# Patient Record
Sex: Female | Born: 1955
Health system: Southern US, Community
[De-identification: ages and names within clinical notes are randomized; demographics above are authoritative.]

## PROBLEM LIST (undated history)

## (undated) DIAGNOSIS — R1310 Dysphagia, unspecified: Secondary | ICD-10-CM

## (undated) DIAGNOSIS — K648 Other hemorrhoids: Secondary | ICD-10-CM

## (undated) DIAGNOSIS — M797 Fibromyalgia: Secondary | ICD-10-CM

## (undated) DIAGNOSIS — E669 Obesity, unspecified: Secondary | ICD-10-CM

## (undated) DIAGNOSIS — E785 Hyperlipidemia, unspecified: Secondary | ICD-10-CM

## (undated) DIAGNOSIS — F419 Anxiety disorder, unspecified: Secondary | ICD-10-CM

## (undated) DIAGNOSIS — Z8489 Family history of other specified conditions: Secondary | ICD-10-CM

## (undated) DIAGNOSIS — M7989 Other specified soft tissue disorders: Secondary | ICD-10-CM

## (undated) DIAGNOSIS — Z464 Encounter for fitting and adjustment of orthodontic device: Secondary | ICD-10-CM

## (undated) DIAGNOSIS — R55 Syncope and collapse: Secondary | ICD-10-CM

## (undated) DIAGNOSIS — K5900 Constipation, unspecified: Secondary | ICD-10-CM

## (undated) DIAGNOSIS — M1990 Unspecified osteoarthritis, unspecified site: Secondary | ICD-10-CM

## (undated) DIAGNOSIS — IMO0001 Reserved for inherently not codable concepts without codable children: Secondary | ICD-10-CM

## (undated) DIAGNOSIS — G4730 Sleep apnea, unspecified: Secondary | ICD-10-CM

## (undated) DIAGNOSIS — G43909 Migraine, unspecified, not intractable, without status migrainosus: Secondary | ICD-10-CM

## (undated) DIAGNOSIS — E538 Deficiency of other specified B group vitamins: Secondary | ICD-10-CM

## (undated) DIAGNOSIS — K219 Gastro-esophageal reflux disease without esophagitis: Secondary | ICD-10-CM

## (undated) DIAGNOSIS — M545 Low back pain, unspecified: Secondary | ICD-10-CM

## (undated) DIAGNOSIS — T7840XA Allergy, unspecified, initial encounter: Secondary | ICD-10-CM

## (undated) DIAGNOSIS — IMO0002 Reserved for concepts with insufficient information to code with codable children: Secondary | ICD-10-CM

## (undated) DIAGNOSIS — D509 Iron deficiency anemia, unspecified: Secondary | ICD-10-CM

## (undated) DIAGNOSIS — R12 Heartburn: Secondary | ICD-10-CM

## (undated) DIAGNOSIS — B49 Unspecified mycosis: Secondary | ICD-10-CM

## (undated) HISTORY — DX: Constipation, unspecified: K59.00

## (undated) HISTORY — DX: Other specified soft tissue disorders: M79.89

## (undated) HISTORY — DX: Dysphagia, unspecified: R13.10

## (undated) HISTORY — DX: Fibromyalgia: M79.7

## (undated) HISTORY — DX: Low back pain: M54.5

## (undated) HISTORY — DX: Low back pain, unspecified: M54.50

## (undated) HISTORY — DX: Iron deficiency anemia, unspecified: D50.9

## (undated) HISTORY — PX: NASAL SINUS SURGERY: SHX719

## (undated) HISTORY — DX: Unspecified osteoarthritis, unspecified site: M19.90

## (undated) HISTORY — DX: Sleep apnea, unspecified: G47.30

## (undated) HISTORY — DX: Unspecified mycosis: B49

## (undated) HISTORY — DX: Hyperlipidemia, unspecified: E78.5

## (undated) HISTORY — DX: Gastro-esophageal reflux disease without esophagitis: K21.9

## (undated) HISTORY — PX: COLONOSCOPY: SHX174

## (undated) HISTORY — DX: Obesity, unspecified: E66.9

## (undated) HISTORY — PX: TONSILLECTOMY AND ADENOIDECTOMY: SUR1326

## (undated) HISTORY — DX: Anxiety disorder, unspecified: F41.9

## (undated) HISTORY — PX: TUBAL LIGATION: SHX77

## (undated) HISTORY — DX: Heartburn: R12

## (undated) HISTORY — DX: Deficiency of other specified B group vitamins: E53.8

## (undated) HISTORY — DX: Other hemorrhoids: K64.8

## (undated) HISTORY — DX: Reserved for concepts with insufficient information to code with codable children: IMO0002

## (undated) HISTORY — DX: Allergy, unspecified, initial encounter: T78.40XA

## (undated) HISTORY — PX: KNEE SURGERY: SHX244

---

## 1998-12-09 HISTORY — PX: HAND SURGERY: SHX662

## 2003-09-22 ENCOUNTER — Other Ambulatory Visit: Admission: RE | Admit: 2003-09-22 | Discharge: 2003-09-22 | Payer: Self-pay | Admitting: *Deleted

## 2003-12-10 DIAGNOSIS — B49 Unspecified mycosis: Secondary | ICD-10-CM

## 2003-12-10 HISTORY — PX: DILATION AND CURETTAGE OF UTERUS: SHX78

## 2003-12-10 HISTORY — DX: Unspecified mycosis: B49

## 2004-01-23 ENCOUNTER — Ambulatory Visit (HOSPITAL_BASED_OUTPATIENT_CLINIC_OR_DEPARTMENT_OTHER): Admission: RE | Admit: 2004-01-23 | Discharge: 2004-01-23 | Payer: Self-pay | Admitting: *Deleted

## 2004-01-23 ENCOUNTER — Ambulatory Visit (HOSPITAL_COMMUNITY): Admission: RE | Admit: 2004-01-23 | Discharge: 2004-01-23 | Payer: Self-pay | Admitting: *Deleted

## 2004-01-23 ENCOUNTER — Encounter (INDEPENDENT_AMBULATORY_CARE_PROVIDER_SITE_OTHER): Payer: Self-pay | Admitting: Specialist

## 2004-10-11 ENCOUNTER — Ambulatory Visit: Payer: Self-pay | Admitting: Gastroenterology

## 2004-10-19 ENCOUNTER — Ambulatory Visit: Payer: Self-pay | Admitting: Gastroenterology

## 2004-10-30 ENCOUNTER — Ambulatory Visit: Payer: Self-pay | Admitting: Family Medicine

## 2004-11-05 ENCOUNTER — Ambulatory Visit: Payer: Self-pay | Admitting: Family Medicine

## 2004-12-11 ENCOUNTER — Ambulatory Visit: Payer: Self-pay | Admitting: Gastroenterology

## 2005-01-08 ENCOUNTER — Ambulatory Visit (HOSPITAL_COMMUNITY): Admission: RE | Admit: 2005-01-08 | Discharge: 2005-01-08 | Payer: Self-pay | Admitting: Gastroenterology

## 2005-01-14 ENCOUNTER — Ambulatory Visit: Payer: Self-pay | Admitting: Gastroenterology

## 2005-01-14 ENCOUNTER — Ambulatory Visit (HOSPITAL_COMMUNITY): Admission: RE | Admit: 2005-01-14 | Discharge: 2005-01-14 | Payer: Self-pay | Admitting: Gastroenterology

## 2005-01-22 ENCOUNTER — Ambulatory Visit: Payer: Self-pay | Admitting: Gastroenterology

## 2005-01-25 ENCOUNTER — Ambulatory Visit (HOSPITAL_COMMUNITY): Admission: RE | Admit: 2005-01-25 | Discharge: 2005-01-25 | Payer: Self-pay | Admitting: Gastroenterology

## 2005-02-05 ENCOUNTER — Ambulatory Visit: Payer: Self-pay | Admitting: Gastroenterology

## 2005-02-12 ENCOUNTER — Ambulatory Visit: Payer: Self-pay | Admitting: Family Medicine

## 2005-03-08 ENCOUNTER — Ambulatory Visit: Payer: Self-pay | Admitting: Family Medicine

## 2005-04-03 ENCOUNTER — Ambulatory Visit: Payer: Self-pay | Admitting: Family Medicine

## 2005-06-07 ENCOUNTER — Ambulatory Visit: Payer: Self-pay | Admitting: Family Medicine

## 2005-06-28 ENCOUNTER — Ambulatory Visit: Payer: Self-pay | Admitting: Family Medicine

## 2005-07-30 ENCOUNTER — Ambulatory Visit: Payer: Self-pay | Admitting: Family Medicine

## 2005-11-26 ENCOUNTER — Ambulatory Visit: Payer: Self-pay | Admitting: Family Medicine

## 2005-12-03 ENCOUNTER — Ambulatory Visit: Payer: Self-pay | Admitting: Family Medicine

## 2006-01-08 ENCOUNTER — Ambulatory Visit: Payer: Self-pay | Admitting: Family Medicine

## 2006-01-09 ENCOUNTER — Encounter: Payer: Self-pay | Admitting: Family Medicine

## 2006-01-14 ENCOUNTER — Ambulatory Visit: Payer: Self-pay | Admitting: Family Medicine

## 2006-02-04 ENCOUNTER — Ambulatory Visit: Payer: Self-pay | Admitting: Gastroenterology

## 2006-03-07 ENCOUNTER — Ambulatory Visit: Payer: Self-pay | Admitting: Family Medicine

## 2006-05-13 ENCOUNTER — Other Ambulatory Visit: Payer: Self-pay

## 2006-05-13 ENCOUNTER — Emergency Department: Payer: Self-pay | Admitting: Emergency Medicine

## 2006-05-15 ENCOUNTER — Ambulatory Visit: Payer: Self-pay | Admitting: Family Medicine

## 2006-05-20 ENCOUNTER — Ambulatory Visit: Payer: Self-pay | Admitting: Family Medicine

## 2006-08-05 ENCOUNTER — Ambulatory Visit: Payer: Self-pay | Admitting: Family Medicine

## 2006-08-19 ENCOUNTER — Ambulatory Visit: Payer: Self-pay | Admitting: Family Medicine

## 2006-09-09 ENCOUNTER — Ambulatory Visit: Payer: Self-pay | Admitting: Internal Medicine

## 2006-12-05 ENCOUNTER — Ambulatory Visit: Payer: Self-pay | Admitting: Family Medicine

## 2006-12-26 ENCOUNTER — Ambulatory Visit: Payer: Self-pay | Admitting: Family Medicine

## 2007-01-14 ENCOUNTER — Ambulatory Visit: Payer: Self-pay | Admitting: Family Medicine

## 2007-02-12 ENCOUNTER — Ambulatory Visit: Payer: Self-pay | Admitting: Obstetrics & Gynecology

## 2007-02-12 ENCOUNTER — Encounter: Payer: Self-pay | Admitting: Obstetrics & Gynecology

## 2007-03-05 ENCOUNTER — Ambulatory Visit: Payer: Self-pay | Admitting: Family Medicine

## 2007-03-06 ENCOUNTER — Encounter: Payer: Self-pay | Admitting: Family Medicine

## 2007-03-06 DIAGNOSIS — M545 Low back pain, unspecified: Secondary | ICD-10-CM | POA: Insufficient documentation

## 2007-03-06 DIAGNOSIS — D509 Iron deficiency anemia, unspecified: Secondary | ICD-10-CM | POA: Insufficient documentation

## 2007-03-06 DIAGNOSIS — K219 Gastro-esophageal reflux disease without esophagitis: Secondary | ICD-10-CM | POA: Insufficient documentation

## 2007-03-06 DIAGNOSIS — Z87898 Personal history of other specified conditions: Secondary | ICD-10-CM

## 2007-03-06 DIAGNOSIS — R32 Unspecified urinary incontinence: Secondary | ICD-10-CM | POA: Insufficient documentation

## 2007-03-06 DIAGNOSIS — G4733 Obstructive sleep apnea (adult) (pediatric): Secondary | ICD-10-CM | POA: Insufficient documentation

## 2007-03-06 DIAGNOSIS — J309 Allergic rhinitis, unspecified: Secondary | ICD-10-CM | POA: Insufficient documentation

## 2007-03-06 DIAGNOSIS — K449 Diaphragmatic hernia without obstruction or gangrene: Secondary | ICD-10-CM | POA: Insufficient documentation

## 2007-03-06 DIAGNOSIS — G43909 Migraine, unspecified, not intractable, without status migrainosus: Secondary | ICD-10-CM | POA: Insufficient documentation

## 2007-03-06 DIAGNOSIS — E785 Hyperlipidemia, unspecified: Secondary | ICD-10-CM

## 2007-03-06 DIAGNOSIS — D126 Benign neoplasm of colon, unspecified: Secondary | ICD-10-CM | POA: Insufficient documentation

## 2007-03-13 ENCOUNTER — Ambulatory Visit: Payer: Self-pay | Admitting: Family Medicine

## 2007-05-07 ENCOUNTER — Ambulatory Visit: Payer: Self-pay | Admitting: Obstetrics & Gynecology

## 2007-07-14 ENCOUNTER — Ambulatory Visit: Payer: Self-pay | Admitting: Family Medicine

## 2007-07-17 LAB — CONVERTED CEMR LAB
ALT: 15 units/L (ref 0–35)
AST: 19 units/L (ref 0–37)
Triglycerides: 130 mg/dL (ref 0–149)
VLDL: 26 mg/dL (ref 0–40)

## 2007-07-24 ENCOUNTER — Telehealth (INDEPENDENT_AMBULATORY_CARE_PROVIDER_SITE_OTHER): Payer: Self-pay | Admitting: *Deleted

## 2007-09-09 ENCOUNTER — Telehealth: Payer: Self-pay | Admitting: Family Medicine

## 2007-09-09 ENCOUNTER — Encounter: Payer: Self-pay | Admitting: Family Medicine

## 2007-10-19 ENCOUNTER — Ambulatory Visit: Payer: Self-pay | Admitting: Family Medicine

## 2007-10-20 LAB — CONVERTED CEMR LAB
ALT: 15 units/L (ref 0–35)
HDL: 33.9 mg/dL — ABNORMAL LOW (ref >39.0)
VLDL: 23 mg/dL (ref 0–40)

## 2007-10-23 ENCOUNTER — Telehealth: Payer: Self-pay | Admitting: Family Medicine

## 2007-10-27 ENCOUNTER — Telehealth: Payer: Self-pay | Admitting: Family Medicine

## 2007-10-28 ENCOUNTER — Ambulatory Visit: Payer: Self-pay | Admitting: Family Medicine

## 2007-10-30 LAB — CONVERTED CEMR LAB
Eosinophils Relative: 1.4 % (ref 0.0–5.0)
HCT: 34.6 % — ABNORMAL LOW (ref 36.0–46.0)
Hemoglobin: 12 g/dL (ref 12.0–15.0)
MCV: 83.4 fL (ref 78.0–100.0)
Neutrophils Relative %: 62.7 % (ref 43.0–77.0)
RBC: 4.14 M/uL (ref 3.87–5.11)
RDW: 13.5 % (ref 11.5–14.6)
WBC: 4.9 10*3/uL (ref 4.5–10.5)

## 2007-12-21 ENCOUNTER — Ambulatory Visit: Payer: Self-pay | Admitting: Family Medicine

## 2008-01-14 ENCOUNTER — Encounter: Payer: Self-pay | Admitting: Family Medicine

## 2008-02-02 ENCOUNTER — Ambulatory Visit: Payer: Self-pay | Admitting: Gastroenterology

## 2008-02-02 ENCOUNTER — Ambulatory Visit: Payer: Self-pay | Admitting: Family Medicine

## 2008-02-02 LAB — CONVERTED CEMR LAB
Alkaline Phosphatase: 59 units/L (ref 39–117)
BUN: 7 mg/dL (ref 6–23)
CO2: 27 meq/L (ref 19–32)
Creatinine, Ser: 0.6 mg/dL (ref 0.4–1.2)
Eosinophils Absolute: 0 10*3/uL (ref 0.0–0.6)
Eosinophils Relative: 1 % (ref 0.0–5.0)
Glucose, Bld: 87 mg/dL (ref 70–99)
HCT: 36.2 % (ref 36.0–46.0)
Lymphocytes Relative: 26.9 % (ref 12.0–46.0)
MCV: 84.8 fL (ref 78.0–100.0)
Monocytes Absolute: 0.3 10*3/uL (ref 0.2–0.7)
Neutro Abs: 3.1 10*3/uL (ref 1.4–7.7)
Neutrophils Relative %: 64.3 % (ref 43.0–77.0)
Potassium: 4.2 meq/L (ref 3.5–5.1)
Sodium: 137 meq/L (ref 135–145)
Tissue Transglutaminase Ab, IgA: 0.3 units (ref <7)
Total Bilirubin: 0.7 mg/dL (ref 0.3–1.2)
Total Protein: 6.8 g/dL (ref 6.0–8.3)
Transferrin: 302.2 mg/dL (ref >=212.0)
WBC: 4.7 10*3/uL (ref 4.5–10.5)

## 2008-02-10 ENCOUNTER — Ambulatory Visit: Payer: Self-pay | Admitting: Family Medicine

## 2008-02-10 ENCOUNTER — Encounter: Payer: Self-pay | Admitting: Family Medicine

## 2008-02-12 ENCOUNTER — Encounter (INDEPENDENT_AMBULATORY_CARE_PROVIDER_SITE_OTHER): Payer: Self-pay | Admitting: *Deleted

## 2008-02-22 ENCOUNTER — Ambulatory Visit: Payer: Self-pay | Admitting: Gastroenterology

## 2008-02-22 ENCOUNTER — Encounter: Payer: Self-pay | Admitting: Family Medicine

## 2008-02-22 LAB — HM COLONOSCOPY: HM Colonoscopy: NORMAL

## 2008-02-23 ENCOUNTER — Telehealth: Payer: Self-pay | Admitting: Family Medicine

## 2008-02-23 DIAGNOSIS — E538 Deficiency of other specified B group vitamins: Secondary | ICD-10-CM | POA: Insufficient documentation

## 2008-02-24 ENCOUNTER — Ambulatory Visit: Payer: Self-pay | Admitting: Family Medicine

## 2008-03-07 ENCOUNTER — Ambulatory Visit: Payer: Self-pay | Admitting: Family Medicine

## 2008-03-07 ENCOUNTER — Encounter (INDEPENDENT_AMBULATORY_CARE_PROVIDER_SITE_OTHER): Payer: Self-pay | Admitting: *Deleted

## 2008-03-15 ENCOUNTER — Telehealth: Payer: Self-pay | Admitting: Family Medicine

## 2008-03-16 ENCOUNTER — Encounter: Payer: Self-pay | Admitting: Family Medicine

## 2008-03-17 ENCOUNTER — Telehealth: Payer: Self-pay | Admitting: Family Medicine

## 2008-04-06 ENCOUNTER — Ambulatory Visit: Payer: Self-pay | Admitting: Family Medicine

## 2008-05-18 ENCOUNTER — Ambulatory Visit: Payer: Self-pay | Admitting: Family Medicine

## 2008-06-21 ENCOUNTER — Ambulatory Visit: Payer: Self-pay | Admitting: Family Medicine

## 2008-06-27 ENCOUNTER — Ambulatory Visit: Payer: Self-pay | Admitting: Family Medicine

## 2008-06-27 DIAGNOSIS — K589 Irritable bowel syndrome without diarrhea: Secondary | ICD-10-CM

## 2008-06-27 LAB — CONVERTED CEMR LAB
Basophils Absolute: 0 10*3/uL (ref 0.0–0.1)
Bilirubin, Direct: 0.1 mg/dL (ref 0.0–0.3)
Calcium: 9 mg/dL (ref 8.4–10.5)
Eosinophils Absolute: 0 10*3/uL (ref 0.0–0.7)
Eosinophils Relative: 1 % (ref 0.0–5.0)
Folate: 11.7 ng/mL
GFR calc Af Amer: 113 mL/min
GFR calc non Af Amer: 94 mL/min
HDL: 33.4 mg/dL — ABNORMAL LOW (ref >39.0)
MCHC: 32.5 g/dL (ref 30.0–36.0)
MCV: 84.7 fL (ref 78.0–100.0)
Neutrophils Relative %: 65.1 % (ref 43.0–77.0)
Platelets: 238 10*3/uL (ref 150–400)
Saturation Ratios: 8.4 % — ABNORMAL LOW (ref 20.0–50.0)
Sodium: 142 meq/L (ref 135–145)
Total Bilirubin: 0.7 mg/dL (ref 0.3–1.2)
Transferrin: 290.4 mg/dL (ref >=212.0)
VLDL: 18 mg/dL (ref 0–40)
Vitamin B-12: 306 pg/mL (ref 211–911)
WBC: 3.9 10*3/uL — ABNORMAL LOW (ref 4.5–10.5)

## 2008-07-11 ENCOUNTER — Ambulatory Visit: Payer: Self-pay | Admitting: Family Medicine

## 2008-07-11 LAB — CONVERTED CEMR LAB: KOH Prep: NEGATIVE

## 2008-08-08 ENCOUNTER — Ambulatory Visit: Payer: Self-pay | Admitting: Family Medicine

## 2008-08-08 ENCOUNTER — Telehealth (INDEPENDENT_AMBULATORY_CARE_PROVIDER_SITE_OTHER): Payer: Self-pay | Admitting: *Deleted

## 2008-09-05 ENCOUNTER — Ambulatory Visit: Payer: Self-pay | Admitting: Family Medicine

## 2008-09-06 LAB — CONVERTED CEMR LAB
AST: 21 units/L (ref 0–37)
Ferritin: 4.4 ng/mL — ABNORMAL LOW (ref 10.0–291.0)
LDL Cholesterol: 92 mg/dL (ref 0–99)
Total CHOL/HDL Ratio: 3.7

## 2008-09-14 ENCOUNTER — Telehealth (INDEPENDENT_AMBULATORY_CARE_PROVIDER_SITE_OTHER): Payer: Self-pay | Admitting: *Deleted

## 2008-09-19 ENCOUNTER — Ambulatory Visit: Payer: Self-pay | Admitting: Family Medicine

## 2008-10-03 ENCOUNTER — Telehealth: Payer: Self-pay | Admitting: Family Medicine

## 2008-11-02 ENCOUNTER — Ambulatory Visit: Payer: Self-pay | Admitting: Family Medicine

## 2008-12-14 ENCOUNTER — Ambulatory Visit: Payer: Self-pay | Admitting: Family Medicine

## 2008-12-26 ENCOUNTER — Telehealth: Payer: Self-pay | Admitting: Family Medicine

## 2009-01-25 ENCOUNTER — Ambulatory Visit: Payer: Self-pay | Admitting: Family Medicine

## 2009-03-08 ENCOUNTER — Ambulatory Visit: Payer: Self-pay | Admitting: Family Medicine

## 2009-03-08 DIAGNOSIS — E559 Vitamin D deficiency, unspecified: Secondary | ICD-10-CM

## 2009-03-08 DIAGNOSIS — R5381 Other malaise: Secondary | ICD-10-CM | POA: Insufficient documentation

## 2009-03-08 DIAGNOSIS — R5383 Other fatigue: Secondary | ICD-10-CM

## 2009-03-13 LAB — CONVERTED CEMR LAB
ALT: 12 units/L (ref 0–35)
AST: 17 units/L (ref 0–37)
Albumin: 4.1 g/dL (ref 3.5–5.2)
BUN: 15 mg/dL (ref 6–23)
Basophils Relative: 0.8 % (ref 0.0–3.0)
Chloride: 109 meq/L (ref 96–112)
Cholesterol: 193 mg/dL (ref 0–200)
Eosinophils Relative: 0.9 % (ref 0.0–5.0)
HCT: 36.1 % (ref 36.0–46.0)
Hemoglobin: 12.5 g/dL (ref 12.0–15.0)
LDL Cholesterol: 134 mg/dL — ABNORMAL HIGH (ref 0–99)
Lymphs Abs: 1.4 10*3/uL (ref 0.7–4.0)
MCV: 85.6 fL (ref 78.0–100.0)
Monocytes Absolute: 0.3 10*3/uL (ref 0.1–1.0)
Monocytes Relative: 7.8 % (ref 3.0–12.0)
Neutro Abs: 2.7 10*3/uL (ref 1.4–7.7)
Platelets: 252 10*3/uL (ref 150.0–400.0)
Potassium: 3.5 meq/L (ref 3.5–5.1)
Sodium: 142 meq/L (ref 135–145)
TSH: 1.63 microintl units/mL (ref 0.35–5.50)
Total Bilirubin: 1 mg/dL (ref 0.3–1.2)
Total Protein: 6.9 g/dL (ref 6.0–8.3)
Vit D, 25-Hydroxy: 23 ng/mL — ABNORMAL LOW (ref 30–89)
WBC: 4.4 10*3/uL — ABNORMAL LOW (ref 4.5–10.5)

## 2009-03-15 ENCOUNTER — Ambulatory Visit: Payer: Self-pay | Admitting: Family Medicine

## 2009-03-15 ENCOUNTER — Encounter: Payer: Self-pay | Admitting: Family Medicine

## 2009-03-15 LAB — HM MAMMOGRAPHY: HM Mammogram: NEGATIVE

## 2009-03-20 ENCOUNTER — Encounter (INDEPENDENT_AMBULATORY_CARE_PROVIDER_SITE_OTHER): Payer: Self-pay | Admitting: *Deleted

## 2009-04-19 ENCOUNTER — Ambulatory Visit: Payer: Self-pay | Admitting: Family Medicine

## 2009-05-17 ENCOUNTER — Encounter: Admission: RE | Admit: 2009-05-17 | Discharge: 2009-05-17 | Payer: Self-pay | Admitting: Family Medicine

## 2009-05-17 ENCOUNTER — Ambulatory Visit: Payer: Self-pay | Admitting: Family Medicine

## 2009-05-17 DIAGNOSIS — M542 Cervicalgia: Secondary | ICD-10-CM

## 2009-05-18 ENCOUNTER — Telehealth: Payer: Self-pay | Admitting: Family Medicine

## 2009-05-20 ENCOUNTER — Encounter: Payer: Self-pay | Admitting: Family Medicine

## 2009-05-23 ENCOUNTER — Encounter: Payer: Self-pay | Admitting: Family Medicine

## 2009-05-26 ENCOUNTER — Ambulatory Visit: Payer: Self-pay | Admitting: Family Medicine

## 2009-05-26 ENCOUNTER — Encounter (INDEPENDENT_AMBULATORY_CARE_PROVIDER_SITE_OTHER): Payer: Self-pay | Admitting: *Deleted

## 2009-05-29 LAB — CONVERTED CEMR LAB
ALT: 23 units/L (ref 0–35)
AST: 20 units/L (ref 0–37)
Total CHOL/HDL Ratio: 3
Vitamin B-12: 480 pg/mL (ref 211–911)

## 2009-06-02 LAB — CONVERTED CEMR LAB: Vit D, 25-Hydroxy: 30 ng/mL (ref 30–89)

## 2009-07-03 ENCOUNTER — Ambulatory Visit: Payer: Self-pay | Admitting: Family Medicine

## 2009-08-15 ENCOUNTER — Ambulatory Visit: Payer: Self-pay | Admitting: Family Medicine

## 2009-08-25 ENCOUNTER — Ambulatory Visit: Payer: Self-pay | Admitting: Family Medicine

## 2009-08-30 ENCOUNTER — Telehealth (INDEPENDENT_AMBULATORY_CARE_PROVIDER_SITE_OTHER): Payer: Self-pay | Admitting: *Deleted

## 2009-08-30 LAB — CONVERTED CEMR LAB: Vit D, 25-Hydroxy: 23 ng/mL — ABNORMAL LOW (ref 30–89)

## 2009-09-11 ENCOUNTER — Encounter (INDEPENDENT_AMBULATORY_CARE_PROVIDER_SITE_OTHER): Payer: Self-pay | Admitting: *Deleted

## 2009-09-26 ENCOUNTER — Telehealth: Payer: Self-pay | Admitting: Family Medicine

## 2009-09-26 ENCOUNTER — Ambulatory Visit: Payer: Self-pay | Admitting: Family Medicine

## 2009-09-29 ENCOUNTER — Ambulatory Visit: Payer: Self-pay | Admitting: Family Medicine

## 2009-11-08 ENCOUNTER — Ambulatory Visit: Payer: Self-pay | Admitting: Family Medicine

## 2009-12-06 ENCOUNTER — Ambulatory Visit: Payer: Self-pay | Admitting: Family Medicine

## 2009-12-20 ENCOUNTER — Ambulatory Visit: Payer: Self-pay | Admitting: Family Medicine

## 2010-01-31 ENCOUNTER — Ambulatory Visit: Payer: Self-pay | Admitting: Family Medicine

## 2010-02-28 ENCOUNTER — Ambulatory Visit: Payer: Self-pay | Admitting: Family Medicine

## 2010-02-28 DIAGNOSIS — M7918 Myalgia, other site: Secondary | ICD-10-CM | POA: Insufficient documentation

## 2010-02-28 DIAGNOSIS — IMO0001 Reserved for inherently not codable concepts without codable children: Secondary | ICD-10-CM

## 2010-02-28 DIAGNOSIS — M2550 Pain in unspecified joint: Secondary | ICD-10-CM

## 2010-02-28 LAB — CONVERTED CEMR LAB
Albumin: 4 g/dL (ref 3.5–5.2)
Basophils Absolute: 0 10*3/uL (ref 0.0–0.1)
Basophils Relative: 0.8 % (ref 0.0–3.0)
CO2: 27 meq/L (ref 19–32)
Calcium: 9 mg/dL (ref 8.4–10.5)
Chloride: 106 meq/L (ref 96–112)
Creatinine, Ser: 0.7 mg/dL (ref 0.4–1.2)
Eosinophils Absolute: 0.1 10*3/uL (ref 0.0–0.7)
Folate: 13.6 ng/mL
Glucose, Bld: 83 mg/dL (ref 70–99)
Lymphocytes Relative: 25.5 % (ref 12.0–46.0)
MCHC: 32.7 g/dL (ref 30.0–36.0)
MCV: 86.3 fL (ref 78.0–100.0)
Monocytes Absolute: 0.3 10*3/uL (ref 0.1–1.0)
Neutro Abs: 3 10*3/uL (ref 1.4–7.7)
Neutrophils Relative %: 65.2 % (ref 43.0–77.0)
RBC: 4.29 M/uL (ref 3.87–5.11)
RDW: 13.9 % (ref 11.5–14.6)
Rhuematoid fact SerPl-aCnc: 27.4 intl units/mL — ABNORMAL HIGH (ref 0.0–20.0)
Sed Rate: 15 mm/hr (ref 0–22)
Total Protein: 7 g/dL (ref 6.0–8.3)

## 2010-03-05 LAB — CONVERTED CEMR LAB: Anti Nuclear Antibody(ANA): NEGATIVE

## 2010-03-12 ENCOUNTER — Telehealth: Payer: Self-pay | Admitting: Family Medicine

## 2010-03-14 ENCOUNTER — Ambulatory Visit: Payer: Self-pay | Admitting: Family Medicine

## 2010-03-19 ENCOUNTER — Telehealth: Payer: Self-pay | Admitting: Family Medicine

## 2010-03-20 ENCOUNTER — Encounter: Payer: Self-pay | Admitting: Family Medicine

## 2010-03-20 ENCOUNTER — Ambulatory Visit: Payer: Self-pay | Admitting: Family Medicine

## 2010-03-21 ENCOUNTER — Encounter: Payer: Self-pay | Admitting: Family Medicine

## 2010-03-26 ENCOUNTER — Encounter (INDEPENDENT_AMBULATORY_CARE_PROVIDER_SITE_OTHER): Payer: Self-pay | Admitting: *Deleted

## 2010-04-25 ENCOUNTER — Ambulatory Visit: Payer: Self-pay | Admitting: Family Medicine

## 2010-06-06 ENCOUNTER — Ambulatory Visit: Payer: Self-pay | Admitting: Family Medicine

## 2010-07-17 ENCOUNTER — Ambulatory Visit: Payer: Self-pay | Admitting: Family Medicine

## 2010-08-28 ENCOUNTER — Ambulatory Visit: Payer: Self-pay | Admitting: Family Medicine

## 2010-10-16 ENCOUNTER — Ambulatory Visit: Payer: Self-pay | Admitting: Family Medicine

## 2010-11-27 ENCOUNTER — Ambulatory Visit: Payer: Self-pay | Admitting: Family Medicine

## 2010-12-27 ENCOUNTER — Telehealth: Payer: Self-pay | Admitting: Family Medicine

## 2010-12-28 ENCOUNTER — Ambulatory Visit
Admission: RE | Admit: 2010-12-28 | Discharge: 2010-12-28 | Payer: Self-pay | Source: Home / Self Care | Attending: Family Medicine | Admitting: Family Medicine

## 2010-12-28 DIAGNOSIS — N898 Other specified noninflammatory disorders of vagina: Secondary | ICD-10-CM | POA: Insufficient documentation

## 2011-01-08 NOTE — Assessment & Plan Note (Signed)
Summary: Pamela Anthony B12/RBH   Nurse Visit   Allergies: 1)  * Oxytrol 2)  Lipitor  Medication Administration  Injection # 1:    Medication: Vit B12 1000 mcg    Diagnosis: B12 DEFICIENCY (ICD-266.2)    Route: IM    Site: L deltoid    Exp Date: 12/09/2011    Lot #: [id number]    Mfr: American Regent    Patient tolerated injection without complications    Given by: Delilah Shan CMA Duncan Dull) (June 06, 2010 11:44 AM)  Orders Added: 1)  Admin of Therapeutic Inj  intramuscular or subcutaneous [96372] 2)  Vit B12 1000 mcg [J3420]   Medication Administration  Injection # 1:    Medication: Vit B12 1000 mcg    Diagnosis: B12 DEFICIENCY (ICD-266.2)    Route: IM    Site: L deltoid    Exp Date: 12/09/2011    Lot #: [id number]    Mfr: American Regent    Patient tolerated injection without complications    Given by: Delilah Shan CMA (AAMA) (June 06, 2010 11:44 AM)  Orders Added: 1)  Admin of Therapeutic Inj  intramuscular or subcutaneous [96372] 2)  Vit B12 1000 mcg [J3420]

## 2011-01-08 NOTE — Assessment & Plan Note (Signed)
Summary: Pamela Anthony B12/RBH   Nurse Visit   Allergies: 1)  * Oxytrol 2)  Lipitor  Medication Administration  Injection # 1:    Medication: Vit B12 1000 mcg    Diagnosis: B12 DEFICIENCY (ICD-266.2)    Route: IM    Site: R deltoid    Exp Date: 10/2011    Lot #: [id number]    Mfr: American Regent    Patient tolerated injection without complications    Given by: Lowella Petties CMA (Apr 25, 2010 11:55 AM)  Orders Added: 1)  Vit B12 1000 mcg [J3420] 2)  Admin of Therapeutic Inj  intramuscular or subcutaneous [96372]   Medication Administration  Injection # 1:    Medication: Vit B12 1000 mcg    Diagnosis: B12 DEFICIENCY (ICD-266.2)    Route: IM    Site: R deltoid    Exp Date: 10/2011    Lot #: [id number]    Mfr: American Regent    Patient tolerated injection without complications    Given by: Lowella Petties CMA (Apr 25, 2010 11:55 AM)  Orders Added: 1)  Vit B12 1000 mcg [J3420] 2)  Admin of Therapeutic Inj  intramuscular or subcutaneous [[address]]

## 2011-01-08 NOTE — Assessment & Plan Note (Signed)
Summary: B-12,FLU SHOT   Nurse Visit   Allergies: 1)  * Oxytrol 2)  Lipitor  Medication Administration  Injection # 1:    Medication: Vit B12 1000 mcg    Diagnosis: B12 DEFICIENCY (ICD-266.2)    Route: IM    Site: R deltoid    Exp Date: 11/09/2011    Lot #: [id number]    Mfr: American Regent    Patient tolerated injection without complications    Given by: Sydell Axon LPN (August 28, 2010 11:45 AM)  Orders Added: 1)  Admin 1st Vaccine [90471] 2)  Flu Vaccine 10yrs + [96045] 3)  Vit B12 1000 mcg [J3420] 4)  Admin of Therapeutic Inj  intramuscular or subcutaneous [96372]  Flu Vaccine Consent Questions     Do you have a history of severe allergic reactions to this vaccine? no    Any prior history of allergic reactions to egg and/or gelatin? no    Do you have a sensitivity to the preservative Thimersol? no    Do you have a past history of Guillan-Barre Syndrome? no    Do you currently have an acute febrile illness? no    Have you ever had a severe reaction to latex? no    Vaccine information given and explained to patient? yes    Are you currently pregnant? no    Lot Number:[id number]   Exp Date:06/08/2011   Site Given  Left Deltoid IM   Medication Administration  Injection # 1:    Medication: Vit B12 1000 mcg    Diagnosis: B12 DEFICIENCY (ICD-266.2)    Route: IM    Site: R deltoid    Exp Date: 11/09/2011    Lot #: [id number]    Mfr: American Regent    Patient tolerated injection without complications    Given by: Sydell Axon LPN (August 28, 2010 11:45 AM)  Orders Added: 1)  Admin 1st Vaccine [90471] 2)  Flu Vaccine 60yrs + [11914] 3)  Vit B12 1000 mcg [J3420] 4)  Admin of Therapeutic Inj  intramuscular or subcutaneous [78295]

## 2011-01-08 NOTE — Progress Notes (Signed)
Summary: back pain  Phone Note Call from Patient Call back at Work Phone [phone]   Caller: Patient Call For: Judith Part MD Summary of Call: Patient says that she was brushing her teeth a few days ago and turned her back the wrong way and has been in alot of pain since then. She has been taking her flexeril and Ibuprofin. She says that normally it helps when she has back pain, but it's not working this time. She wants to know if she can have something called in to [facility].  Initial call taken by: Melody Comas,  March 12, 2010 10:30 AM  Follow-up for Phone Call        lets try mobic instead of ibuprofen and see if that works better  if not imp in several days - needs f/u with me or ortho if she has one  if numbness or weakness update me or seek care  Follow-up by: Judith Part MD,  March 12, 2010 10:42 AM  Additional Follow-up for Phone Call Additional follow up Details #1::        Patient notified as instructed by telephone. Medication phoned to [facility] as instructed. Lewanda Rife LPN  March 12, 9628 11:46 AM     New/Updated Medications: MOBIC 15 MG TABS (MELOXICAM) 1 by mouth once daily with food as needed back pain  do not mix with other nsaids Prescriptions: MOBIC 15 MG TABS (MELOXICAM) 1 by mouth once daily with food as needed back pain  do not mix with other nsaids  #30 x 0   Entered and Authorized by:   Judith Part MD   Signed by:   Lewanda Rife LPN on 52/84/1324   Method used:   Telephoned to ...       [facility] [address] [phone]* (retail)       [address] [place], [place]  [id number]       Ph: [phone]       Fax: [phone]   RxID:   [id number]

## 2011-01-08 NOTE — Assessment & Plan Note (Signed)
Summary: BODY ACHES/CLE   Vital Signs:  Patient profile:   55 year old female Height:      66.25 inches Weight:      208.75 pounds BMI:     33.56 Temp:     97.9 degrees F oral Pulse rate:   84 / minute Pulse rhythm:   regular BP sitting:   110 / 78  (left arm) Cuff size:   regular  Vitals Entered By: Lewanda Rife LPN (February 28, 2010 11:52 AM) CC: body aches, mostly arms and from knees down. Pt feels tired and mouth is dry.   History of Present Illness: is achey all over for about a week  no fever or other symptoms / no insect bites  all of muscles and joints hurt no joint swelling or redness  was taking ibup- did not help  stopped her vytorin 1 mo ago -- because she cannot afford it  needs to switch to something else   husb is working - and income is way down  her job is very stressful -- the people at her job are not good for her -- undermine her self confidence  in neg environment but not abused  is really stressed and really tired   very dry mouth -- ? from meds  this has been recent   is extremely stressed   occ gets itching on belly - but no rash there   last vit D level 30- on 4000 international units daily     Allergies: 1)  * Oxytrol 2)  Lipitor  Past History:  Past Medical History: Last updated: 07/03/2009 Allergic rhinitis Anemia-iron deficiency GERD Hyperlipidemia Low back pain Urinary incontinence obesity mild vit D deficiency vit B12 deficiency deg disc dz - back  neurosurg Jenkins  Past Surgical History: Last updated: 02/24/2008 Caesarean section (1985) Sinus surgery- right maxillary (2000's) TSA Knee surgery- right (1970's) Nodule removed from hand (2000) D & C (2005) Cardiac work up for "episode" (2004) Stress test- normal (03/2003) Onchomycosis- Lamilsil (2003) Abd. [place]- negative (10/2004) EGD (10/2004) Colonoscopy (10/2004) Abd. [place]- normal (05/2006) Hidascan- normal (05/2006) 3/09 colonoscopy internal  hemorrhoids  Family History: Last updated: 06/27/2008 father CAD in 76s, OA, pacemaker, CVA  Social History: Last updated: 09/26/2009 Patient never smoked married  occ alcohol   Risk Factors: Smoking Status: quit (03/06/2007)  Review of Systems General:  Complains of fatigue; denies chills, fever, loss of appetite, malaise, sweats, weakness, and weight loss. Eyes:  Denies blurring and eye pain. CV:  Denies chest pain or discomfort, palpitations, and shortness of breath with exertion. Resp:  Denies cough, shortness of breath, and wheezing. GI:  Denies abdominal pain, change in bowel habits, indigestion, nausea, and vomiting. GU:  Denies dysuria and urinary frequency. MS:  Complains of joint pain, low back pain, muscle aches, and stiffness; denies joint redness, joint swelling, cramps, and muscle weakness. Derm:  Denies itching, lesion(s), poor wound healing, and rash. Neuro:  Denies numbness and tingling. Psych:  Denies panic attacks, sense of great danger, and suicidal thoughts/plans. Endo:  Denies cold intolerance, excessive thirst, excessive urination, and heat intolerance. Heme:  Denies abnormal bruising and bleeding.  Physical Exam  General:  Well-developed,well-nourished,in no acute distress; alert,appropriate and cooperative throughout examination Head:  normocephalic, atraumatic, and no abnormalities observed.   Eyes:  vision grossly intact, pupils equal, pupils round, and pupils reactive to light.   Ears:  R ear normal and L ear normal.   Nose:  no nasal discharge.   Mouth:  pharynx pink and moist.   Neck:  supple with full rom and no masses or thyromegally, no JVD or carotid bruit  Chest Wall:  No deformities, masses, or tenderness noted. Lungs:  Normal respiratory effort, chest expands symmetrically. Lungs are clear to auscultation, no crackles or wheezes. Heart:  Normal rate and regular rhythm. S1 and S2 normal without gallop, murmur, click, rub or other extra  sounds. Abdomen:  Bowel sounds positive,abdomen soft and non-tender without masses, organomegaly or hernias noted. Msk:  No deformity or scoliosis noted of thoracic or lumbar spine.  no acute joint changes  some tender trigger points in back and shoulders  Pulses:  R and L carotid,radial,femoral,dorsalis pedis and posterior tibial pulses are full and equal bilaterally Extremities:  No clubbing, cyanosis, edema, or deformity noted with normal full range of motion of all joints.   Neurologic:  strength normal in all extremities, sensation intact to light touch, gait normal, and DTRs symmetrical and normal.   Skin:  Intact without suspicious lesions or rashes Cervical Nodes:  No lymphadenopathy noted Inguinal Nodes:  No significant adenopathy Psych:  seems very stressed not tearful - and talks easily about stress    Impression & Recommendations:  Problem # 1:  ARTHRALGIA (ICD-719.40) Assessment New myalgia and arthralgia with ? etiol  suspect stress may play a role -- some early depressive symptoms (dismotivation/ wt gain )  enc exercise if tol lab today  Orders: Venipuncture ([address]) TLB-B12 + Folate Pnl (60454_09811-B14/NWG) TLB-BMP (Basic Metabolic Panel-BMET) (80048-METABOL) TLB-CBC Platelet - w/Differential (85025-CBCD) TLB-Hepatic/Liver Function Pnl (80076-HEPATIC) TLB-TSH (Thyroid Stimulating Hormone) (84443-TSH) TLB-Rheumatoid Factor (RA) (95621-HY) TLB-Sedimentation Rate (ESR) (85652-ESR) T-Antinuclear Antib (ANA) [phone]) T-Vitamin D (25-Hydroxy) (95284-13244)  Problem # 2:  MYALGIA (ICD-729.1) Assessment: New see above- is off chol med -- so not that cause  The following medications were removed from the medication list:    Naproxen 500 Mg Tabs (Naproxen) .Marland Kitchen... 1 by mouth two times a day for maximum of 2 weeks Her updated medication list for this problem includes:    Flexeril 10 Mg Tabs (Cyclobenzaprine hcl) .Marland Kitchen... 1 by mouth up to three times a day as needed  muscle spasm and pain  Orders: Venipuncture (01027) TLB-B12 + Folate Pnl (25366_44034-V42/VZD) TLB-BMP (Basic Metabolic Panel-BMET) (80048-METABOL) TLB-CBC Platelet - w/Differential (85025-CBCD) TLB-Hepatic/Liver Function Pnl (80076-HEPATIC) TLB-TSH (Thyroid Stimulating Hormone) (84443-TSH) TLB-Rheumatoid Factor (RA) (63875-IE) TLB-Sedimentation Rate (ESR) (85652-ESR) T-Antinuclear Antib (ANA) [phone]) T-Vitamin D (25-Hydroxy) (66063-01601)  Problem # 3:  FATIGUE (ICD-780.79) Assessment: Deteriorated ongoing but worse lab today  also stress reaction /dec exercise and wt gain  Orders: Venipuncture (09323) TLB-B12 + Folate Pnl (55732_20254-Y70/WCB) TLB-BMP (Basic Metabolic Panel-BMET) (80048-METABOL) TLB-CBC Platelet - w/Differential (85025-CBCD) TLB-Hepatic/Liver Function Pnl (80076-HEPATIC) TLB-TSH (Thyroid Stimulating Hormone) (84443-TSH) TLB-Rheumatoid Factor (RA) (76283-TD) TLB-Sedimentation Rate (ESR) (85652-ESR) T-Antinuclear Antib (ANA) [phone]) T-Vitamin D (25-Hydroxy) (06269-48546)  Problem # 4:  VITAMIN D DEFICIENCY (ICD-268.9) Assessment: Unchanged check level now on 4000 international units daily  Orders: Venipuncture (27035) TLB-B12 + Folate Pnl (00938_18299-B71/IRC) TLB-BMP (Basic Metabolic Panel-BMET) (80048-METABOL) TLB-CBC Platelet - w/Differential (85025-CBCD) TLB-Hepatic/Liver Function Pnl (80076-HEPATIC) TLB-TSH (Thyroid Stimulating Hormone) (84443-TSH) TLB-Rheumatoid Factor (RA) (78938-BO) TLB-Sedimentation Rate (ESR) (85652-ESR) T-Antinuclear Antib (ANA) [phone]) T-Vitamin D (25-Hydroxy) (77824-23536) Specimen Handling (14431)  Problem # 5:  B12 DEFICIENCY (ICD-266.2) Assessment: Unchanged check level on shots - low energy Orders: TLB-B12 + Folate Pnl (54008_67619-J09/TOI) Specimen Handling (71245)  Problem # 6:  STRESS REACTION, ACUTE, WITH EMOTIONAL DISTURBANCE (ICD-308.0) Assessment: Deteriorated ref for counseling for  situational stress suspect this  may exac physical symptoms  disc sit stress/coping skills/ supp sources/ symptoms/ opt for tx and poss side eff in detail today  Orders: Psychology Referral (Psychology)  Problem # 7:  OTHER SCREENING MAMMOGRAM (ICD-V76.12) Assessment: Comment Only sched annual mam which is due  Orders: Radiology Referral (Radiology)  Complete Medication List: 1)  Vesicare 10 Mg Tabs (Solifenacin succinate) .... Take one by mouth daily 2)  Topamax 100 Mg Tabs (Topiramate) .... Take one by mouth qd 3)  Hydrochlorothiazide 25 Mg Tabs (Hydrochlorothiazide) .... 1/2 to 1 by mouth once daily as needed 4)  Vitamin B 12  .Marland Kitchen.. 1 injection q 6 weeks 5)  Flonase 50 Mcg/act Susp (Fluticasone propionate) .... 2 sprays in each nostril once daily for 2 weeks (or longer if it works for allergies) 6)  Flexeril 10 Mg Tabs (Cyclobenzaprine hcl) .Marland Kitchen.. 1 by mouth up to three times a day as needed muscle spasm and pain 7)  Omeprazole 20 Mg Cpdr (Omeprazole) .Marland Kitchen.. 1 by mouth once daily 8)  Vitamin D 1000 Unit Tabs (Cholecalciferol) .... Takes four tablets daily  Patient Instructions: 1)  we will schedule mammogram at check out  2)  get back to healthy diet and exercise  3)  labs today  4)  we will do counseling referral at check out   Current Allergies (reviewed today): * OXYTROL LIPITOR

## 2011-01-08 NOTE — Assessment & Plan Note (Signed)
Summary: B-12/TOWER/JRR   Nurse Visit   Allergies: 1)  * Oxytrol 2)  Lipitor  Medication Administration  Injection # 1:    Medication: Vit B12 1000 mcg    Diagnosis: B12 DEFICIENCY (ICD-266.2)    Route: IM    Site: L deltoid    Exp Date: 03/09/2012    Lot #: [id number]    Mfr: American Regent    Patient tolerated injection without complications    Given by: Lewanda Rife LPN (October 16, 2010 11:52 AM)  Orders Added: 1)  Vit B12 1000 mcg [J3420] 2)  Admin of Therapeutic Inj  intramuscular or subcutaneous [24401]

## 2011-01-08 NOTE — Assessment & Plan Note (Signed)
Summary: B12 INJECTION CYD   Nurse Visit   Allergies: 1)  * Oxytrol 2)  Lipitor  Medication Administration  Injection # 1:    Medication: Vit B12 1000 mcg    Diagnosis: B12 DEFICIENCY (ICD-266.2)    Route: IM    Site: L deltoid    Exp Date: 10/10/2011    Lot #: [id number]    Mfr: American Regent    Patient tolerated injection without complications    Given by: Linde Gillis CMA Duncan Dull) (March 14, 2010 10:33 AM)  Orders Added: 1)  Vit B12 1000 mcg [J3420] 2)  Admin of Therapeutic Inj  intramuscular or subcutaneous [51884]

## 2011-01-08 NOTE — Letter (Signed)
Summary: Results Follow up Letter  [place] [facility]  [address] [place], [place] [address]   Phone: [phone]  Fax: [phone]    03/26/2010 MRN: [id number]     [facility] [address] [place], [place]  [address]    Dear Ms. Wiswell,  The following are the results of your recent test(s):  Test         Result    Pap Smear:        Normal _____  Not Normal _____ Comments: ______________________________________________________ Cholesterol: LDL(Bad cholesterol):         Your goal is less than:         HDL (Good cholesterol):       Your goal is more than: Comments:  ______________________________________________________ Mammogram:        Normal __x___  Not Normal _____ Comments:Repeat in 1 year  ___________________________________________________________________ Hemoccult:        Normal _____  Not normal _______ Comments:    _____________________________________________________________________ Other Tests:    We routinely do not discuss normal results over the telephone.  If you desire a copy of the results, or you have any questions about this information we can discuss them at your next office visit.   Sincerely,  Roxy Manns MD

## 2011-01-08 NOTE — Assessment & Plan Note (Signed)
Summary: tower b12/rbh   Nurse Visit   Allergies: 1)  * Oxytrol 2)  Lipitor  Medication Administration  Injection # 1:    Medication: Vit B12 1000 mcg    Diagnosis: B12 DEFICIENCY (ICD-266.2)    Route: IM    Site: R deltoid    Exp Date: 10/09/2011    Lot #: [id number]    Mfr: American Regent    Patient tolerated injection without complications    Given by: Liane Comber CMA (AAMA) (January 31, 2010 11:53 AM)  Orders Added: 1)  Admin of Therapeutic Inj  intramuscular or subcutaneous [96372] 2)  Vit B12 1000 mcg [J3420]   Medication Administration  Injection # 1:    Medication: Vit B12 1000 mcg    Diagnosis: B12 DEFICIENCY (ICD-266.2)    Route: IM    Site: R deltoid    Exp Date: 10/09/2011    Lot #: [id number]    Mfr: American Regent    Patient tolerated injection without complications    Given by: Liane Comber CMA (AAMA) (January 31, 2010 11:53 AM)  Orders Added: 1)  Admin of Therapeutic Inj  intramuscular or subcutaneous [96372] 2)  Vit B12 1000 mcg [J3420]

## 2011-01-08 NOTE — Progress Notes (Signed)
Summary: back pain  Phone Note Call from Patient Call back at Work Phone [phone] Call back at exenstion 151   Caller: Patient Call For: Judith Part MD Summary of Call: Patient says that she is still having alot of back pain and it is mostly while she is sitting down. She says that it is making it hard to do her job because she does alot of sitting at work. She says that the pain is in her lower back right side, and runs down into her thigh. She wants to know if you want to see her for this of refer her to orthopedic dr. She says that she prefers [facility].  Initial call taken by: Melody Comas,  March 19, 2010 9:56 AM  Follow-up for Phone Call        lets go ahead with ortho ref will do that and ref to [facility] Follow-up by: Judith Part MD,  March 19, 2010 1:24 PM  Additional Follow-up for Phone Call Additional follow up Details #1::        Patient notified as instructed by telephone. Pt has decided she does not want to go to [place]. Pt would like appt with Dr Gerrit Heck at [facility] in [place]. Pt has seen Dr Gerrit Heck before. Pt can't go on a Wed morning or on a Fri. Please call pt on cell at [phone]. Pt will wait to hear from [facility].Lewanda Rife LPN  March 19, 2010 4:31 PM     Additional Follow-up for Phone Call Additional follow up Details #2::    Pt also wants to know if can increase Mobic from one daily to one twice a day for pain.Please advise. Lewanda Rife LPN  March 19, 2010 4:32 PM  no , she is on the max dose- that would be too much Follow-up by: Judith Part MD,  March 19, 2010 4:45 PM  Additional Follow-up for Phone Call Additional follow up Details #3:: Details for Additional Follow-up Action Taken: Appt made with Dr Gerrit Heck at 03/21/2010 at 8:30am. Additional Follow-up by: Carlton Adam,  March 20, 2010 2:01 PM

## 2011-01-08 NOTE — Assessment & Plan Note (Signed)
Summary: B-12   Nurse Visit   Allergies: 1)  * Oxytrol 2)  Lipitor  Medication Administration  Injection # 1:    Medication: Vit B12 1000 mcg    Diagnosis: B12 DEFICIENCY (ICD-266.2)    Route: IM    Site: L deltoid    Exp Date: 03/2012    Lot #: [id number]    Mfr: American Regent    Patient tolerated injection without complications    Given by: Lowella Petties CMA (July 17, 2010 12:52 PM)  Orders Added: 1)  Vit B12 1000 mcg [J3420] 2)  Admin of Therapeutic Inj  intramuscular or subcutaneous [96372]   Medication Administration  Injection # 1:    Medication: Vit B12 1000 mcg    Diagnosis: B12 DEFICIENCY (ICD-266.2)    Route: IM    Site: L deltoid    Exp Date: 03/2012    Lot #: [id number]    Mfr: American Regent    Patient tolerated injection without complications    Given by: Lowella Petties CMA (July 17, 2010 12:52 PM)  Orders Added: 1)  Vit B12 1000 mcg [J3420] 2)  Admin of Therapeutic Inj  intramuscular or subcutaneous [71062]

## 2011-01-08 NOTE — Assessment & Plan Note (Signed)
Summary: Tova Vater B12/RBH   Nurse Visit   Allergies: 1)  * Oxytrol 2)  Lipitor  Medication Administration  Injection # 1:    Medication: Vit B12 1000 mcg    Diagnosis: B12 DEFICIENCY (ICD-266.2)    Route: IM    Site: L deltoid    Exp Date: 04/2011    Lot #: [id number]    Mfr: American Regent    Patient tolerated injection without complications    Given by: Lowella Petties CMA (December 20, 2009 12:19 PM)  Orders Added: 1)  Vit B12 1000 mcg [J3420] 2)  Admin of Therapeutic Inj  intramuscular or subcutaneous [96372]   Medication Administration  Injection # 1:    Medication: Vit B12 1000 mcg    Diagnosis: B12 DEFICIENCY (ICD-266.2)    Route: IM    Site: L deltoid    Exp Date: 04/2011    Lot #: [id number]    Mfr: American Regent    Patient tolerated injection without complications    Given by: Lowella Petties CMA (December 20, 2009 12:19 PM)  Orders Added: 1)  Vit B12 1000 mcg [J3420] 2)  Admin of Therapeutic Inj  intramuscular or subcutaneous [[address]]

## 2011-01-08 NOTE — Letter (Signed)
Summary: [facility]  [facility]   Imported By: Lanelle Bal 04/25/2010 12:54:15  _____________________________________________________________________  External Attachment:    Type:   Image     Comment:   External Document

## 2011-01-10 NOTE — Progress Notes (Signed)
Summary: wants medicine for BV  Phone Note Call from Patient Call back at Work Phone [phone] Call back at x [phone]   Caller: Patient Call For: Judith Part MD Summary of Call: Pt states she has a bacterial vaginal infection.  Has dicomfort, discharge and some odor.  She says she has had these before, so she knows what it is.  She is asking for medicine to be called to [facility] [address].  I told he she will probably need to be seen first, but she wanted me to ask you.              Lowella Petties CMA, AAMA  December 27, 2010 8:42 AM   Follow-up for Phone Call        I do need to see her and test for that condition before treating   Follow-up by: Judith Part MD,  December 27, 2010 9:08 AM  Additional Follow-up for Phone Call Additional follow up Details #1::        Appt scheduled with Dr. Dayton Martes for tomorrow. No openings on Dr. Royden Purl schedule. Additional Follow-up by: Janee Morn CMA (AAMA),  December 27, 2010 10:26 AM

## 2011-01-10 NOTE — Assessment & Plan Note (Signed)
Summary: ? BV (Dr. Milinda Antis had no openings)//kad   Vital Signs:  Patient profile:   55 year old female Height:      66.25 inches Weight:      211.25 pounds BMI:     33.96 Temp:     98.4 degrees F oral Pulse rate:   71 / minute Pulse rhythm:   regular BP sitting:   110 / 80  (right arm) Cuff size:   regular  Vitals Entered By: Linde Gillis CMA Duncan Dull) (December 28, 2010 11:37 AM) CC: ? bacterial vaginosis   History of Present Illness: 55 yo female here for ?BV.  Has had recurrent BV in past although per pt has been months to perhaps years since she had an infection.  Symptoms most recently started last week and feels very much like her last BV infection. Malodourous grey discharge, irritated vulva.  Does not want to be tested for STDs.  Current Medications (verified): 1)  Vesicare 10 Mg  Tabs (Solifenacin Succinate) .... Take One By Mouth Daily 2)  Topamax 100 Mg  Tabs (Topiramate) .... Take One By Mouth Qd 3)  Hydrochlorothiazide 25 Mg  Tabs (Hydrochlorothiazide) .... 1/2 To 1 By Mouth Once Daily As Needed 4)  Vitamin B 12 .Marland Kitchen.. 1 Injection Q 6 Weeks 5)  Flexeril 10 Mg Tabs (Cyclobenzaprine Hcl) .Marland Kitchen.. 1 By Mouth Up To Three Times A Day As Needed Muscle Spasm and Pain 6)  Omeprazole 20 Mg Cpdr (Omeprazole) .Marland Kitchen.. 1 By Mouth Once Daily 7)  Vitamin D 1000 Unit  Tabs (Cholecalciferol) .... Takes Four Tablets Daily 8)  Mobic 15 Mg Tabs (Meloxicam) .Marland Kitchen.. 1 By Mouth Once Daily With Food As Needed Back Pain  Do Not Mix With Other Nsaids 9)  Prozac 10 Mg Caps (Fluoxetine Hcl) .... Take 1/2 Tablet By Mouth Daily 10)  Metronidazole 500 Mg Tabs (Metronidazole) .Marland Kitchen.. 1 Tab By Mouth Two Times A Day X 7 Days  Allergies: 1)  * Oxytrol 2)  Lipitor  Past History:  Past Medical History: Last updated: 07/03/2009 Allergic rhinitis Anemia-iron deficiency GERD Hyperlipidemia Low back pain Urinary incontinence obesity mild vit D deficiency vit B12 deficiency deg disc dz - back  neurosurg  [facility]  Past Surgical History: Last updated: 02/24/2008 Caesarean section (1985) Sinus surgery- right maxillary (2000's) TSA Knee surgery- right (1970's) Nodule removed from hand (2000) D & C (2005) Cardiac work up for "episode" (2004) Stress test- normal (03/2003) Onchomycosis- Lamilsil (2003) Abd. [place]- negative (10/2004) EGD (10/2004) Colonoscopy (10/2004) Abd. [place]- normal (05/2006) Hidascan- normal (05/2006) 3/09 colonoscopy internal hemorrhoids  Family History: Last updated: 06/27/2008 father CAD in 1s, OA, pacemaker, CVA  Social History: Last updated: 09/26/2009 Patient never smoked married  occ alcohol   Risk Factors: Smoking Status: quit (03/06/2007)  Review of Systems      See HPI General:  Denies fever. GI:  Denies abdominal pain, nausea, and vomiting. GU:  Complains of discharge; denies dysuria and genital sores.  Physical Exam  General:  Well-developed,well-nourished,in no acute distress; alert,appropriate and cooperative throughout examination Genitalia:  normal introitus, no external lesions, and vaginal discharge with odor.   Psych:  Cognition and judgment appear intact. Alert and cooperative with normal attention span and concentration. No apparent delusions, illusions, hallucinations   Impression & Recommendations:  Problem # 1:  VAGINAL DISCHARGE (ICD-623.5) Assessment New Wet prep is consistent with BV. Flagyl 500 mg by mouth two times a day x 7 days. Orders: Wet Prep ([id number])  Complete Medication List: 1)  Vesicare 10 Mg Tabs (Solifenacin succinate) .... Take one by mouth daily 2)  Topamax 100 Mg Tabs (Topiramate) .... Take one by mouth qd 3)  Hydrochlorothiazide 25 Mg Tabs (Hydrochlorothiazide) .... 1/2 to 1 by mouth once daily as needed 4)  Vitamin B 12  .Marland Kitchen.. 1 injection q 6 weeks 5)  Flexeril 10 Mg Tabs (Cyclobenzaprine hcl) .Marland Kitchen.. 1 by mouth up to three times a day as needed muscle spasm and pain 6)  Omeprazole 20 Mg Cpdr (Omeprazole)  .Marland Kitchen.. 1 by mouth once daily 7)  Vitamin D 1000 Unit Tabs (Cholecalciferol) .... Takes four tablets daily 8)  Mobic 15 Mg Tabs (Meloxicam) .Marland Kitchen.. 1 by mouth once daily with food as needed back pain  do not mix with other nsaids 9)  Prozac 10 Mg Caps (Fluoxetine hcl) .... Take 1/2 tablet by mouth daily 10)  Metronidazole 500 Mg Tabs (Metronidazole) .Marland Kitchen.. 1 tab by mouth two times a day x 7 days Prescriptions: METRONIDAZOLE 500 MG TABS (METRONIDAZOLE) 1 tab by mouth two times a day x 7 days  #14 x 0   Entered and Authorized by:   Ruthe Mannan MD   Signed by:   Ruthe Mannan MD on 12/28/2010   Method used:   Electronically to        [facility] [address] (retail)       [address], [address]       [place], [place]  [address]       Ph: [phone]       Fax: [phone]   RxID:   [id number]    Orders Added: 1)  Wet Prep [25366YQ] 2)  Est. Patient Level III [03474]    Current Allergies (reviewed today): * OXYTROL LIPITOR  Laboratory Results    Wet Mount/KOH Source: vaginal WBC/hpf 1-5 Clue cells/hpf moderate  Positive whiff Yeast/hpf none Trichomonas/hpf none    Appended Document: ? BV (Dr. Milinda Antis had no openings)//kad     Allergies: 1)  * Oxytrol 2)  Lipitor   Complete Medication List: 1)  Vesicare 10 Mg Tabs (Solifenacin succinate) .... Take one by mouth daily 2)  Topamax 100 Mg Tabs (Topiramate) .... Take one by mouth qd 3)  Hydrochlorothiazide 25 Mg Tabs (Hydrochlorothiazide) .... 1/2 to 1 by mouth once daily as needed 4)  Vitamin B 12  .Marland Kitchen.. 1 injection q 6 weeks 5)  Flexeril 10 Mg Tabs (Cyclobenzaprine hcl) .Marland Kitchen.. 1 by mouth up to three times a day as needed muscle spasm and pain 6)  Omeprazole 20 Mg Cpdr (Omeprazole) .Marland Kitchen.. 1 by mouth once daily 7)  Vitamin D 1000 Unit Tabs (Cholecalciferol) .... Takes four tablets daily 8)  Mobic 15 Mg Tabs (Meloxicam) .Marland Kitchen.. 1 by mouth once daily with food as needed back pain  do not mix with other  nsaids 9)  Prozac 10 Mg Caps (Fluoxetine hcl) .... Take 1/2 tablet by mouth daily 10)  Metronidazole 500 Mg Tabs (Metronidazole) .Marland Kitchen.. 1 tab by mouth two times a day x 7 days  Other Orders: Vit B12 1000 mcg (J3420) Admin of Therapeutic Inj  intramuscular or subcutaneous (25956)   Medication Administration  Injection # 1:    Medication: Vit B12 1000 mcg    Diagnosis: B12 DEFICIENCY (ICD-266.2)    Route: IM    Site: L deltoid    Exp Date: 09/08/2012    Lot #: [id number]    Mfr: [employer]    Patient tolerated  injection without complications    Given by: Linde Gillis CMA Duncan Dull) (December 28, 2010 12:01 PM)  Orders Added: 1)  Vit B12 1000 mcg [J3420] 2)  Admin of Therapeutic Inj  intramuscular or subcutaneous [16109]

## 2011-01-10 NOTE — Assessment & Plan Note (Signed)
Summary: tower b/12/[facility]   Nurse Visit   Allergies: 1)  * Oxytrol 2)  Lipitor  Medication Administration  Injection # 1:    Medication: Vit B12 1000 mcg    Diagnosis: B12 DEFICIENCY (ICD-266.2)    Route: IM    Site: R deltoid    Exp Date: 04/08/2012    Lot #: [id number]    Mfr: [employer]    Patient tolerated injection without complications    Given by: Sydell Axon LPN (November 27, 2010 11:56 AM)  Orders Added: 1)  Vit B12 1000 mcg [J3420] 2)  Admin of Therapeutic Inj  intramuscular or subcutaneous [[id number]]   Medication Administration  Injection # 1:    Medication: Vit B12 1000 mcg    Diagnosis: B12 DEFICIENCY (ICD-266.2)    Route: IM    Site: R deltoid    Exp Date: 04/08/2012    Lot #: [id number]    Mfr: [employer]    Patient tolerated injection without complications    Given by: Sydell Axon LPN (November 27, 2010 11:56 AM)  Orders Added: 1)  Vit B12 1000 mcg [J3420] 2)  Admin of Therapeutic Inj  intramuscular or subcutaneous [44010]

## 2011-02-08 ENCOUNTER — Encounter: Payer: Self-pay | Admitting: Family Medicine

## 2011-02-08 ENCOUNTER — Ambulatory Visit (INDEPENDENT_AMBULATORY_CARE_PROVIDER_SITE_OTHER): Payer: 59

## 2011-02-08 DIAGNOSIS — E538 Deficiency of other specified B group vitamins: Secondary | ICD-10-CM

## 2011-02-14 NOTE — Assessment & Plan Note (Signed)
Summary: TOWER B-12//[facility]   Nurse Visit   Allergies: 1)  * Oxytrol 2)  Lipitor  Medication Administration  Injection # 1:    Medication: Vit B12 1000 mcg    Diagnosis: B12 DEFICIENCY (ICD-266.2)    Route: IM    Site: R deltoid    Exp Date: 09/08/2012    Lot #: [id number]    Mfr: [employer]    Patient tolerated injection without complications    Given by: Linde Gillis CMA Duncan Dull) (February 08, 2011 11:57 AM)  Orders Added: 1)  Vit B12 1000 mcg [J3420] 2)  Admin of Therapeutic Inj  intramuscular or subcutaneous [[id number]]

## 2011-02-18 ENCOUNTER — Telehealth: Payer: Self-pay | Admitting: Family Medicine

## 2011-02-26 NOTE — Progress Notes (Signed)
Summary: BV is back  Phone Note Call from Patient Call back at [phone]   Caller: Patient Call For: Judith Part MD Summary of Call: Pt states she again has BV and she is asking that antibiotic be called to [facility] [address].  She says this is a recurrent problem.  Please let her know if something is called in.                Lowella Petties CMA, AAMA  February 18, 2011 12:56 PM   Follow-up for Phone Call        yes- she just had this a month ago  will repeat course of flagyl -- but it if comes back again I want her to return here or to a gyn for further eval  also she can try otc vaginal prep to change the PH like "rephresh"  sometimes these help  px written on EMR for call in -flagyl (do not drink alcohol with this) Follow-up by: Judith Part MD,  February 18, 2011 2:17 PM  Additional Follow-up for Phone Call Additional follow up Details #1::        Patient notified as instructed by telephone. Med sent electronically to [facility] [address] as instructed.Lewanda Rife LPN  February 18, 2011 4:31 PM     Prescriptions: METRONIDAZOLE 500 MG TABS (METRONIDAZOLE) 1 tab by mouth two times a day x 7 days  #14 x 0   Entered by:   Lewanda Rife LPN   Authorized by:   Judith Part MD   Signed by:   Lewanda Rife LPN on 45/40/9811   Method used:   Electronically to        [facility] (retail)       [address]       [place], [place]  [address]       Ph: [phone]       Fax: [phone]   RxID:   [id number]

## 2011-03-26 ENCOUNTER — Ambulatory Visit: Payer: 59

## 2011-03-28 ENCOUNTER — Ambulatory Visit (INDEPENDENT_AMBULATORY_CARE_PROVIDER_SITE_OTHER): Payer: 59 | Admitting: Family Medicine

## 2011-03-28 DIAGNOSIS — E538 Deficiency of other specified B group vitamins: Secondary | ICD-10-CM

## 2011-03-28 MED ORDER — CYANOCOBALAMIN 1000 MCG/ML IJ SOLN
1000.0000 ug | Freq: Once | INTRAMUSCULAR | Status: AC
  Administered 2011-03-28: 1000 ug via INTRAMUSCULAR

## 2011-03-28 NOTE — Progress Notes (Signed)
  Subjective:    Patient ID: Pamela Anthony, female    DOB: [birth year], 55 y.o.   MRN: [id number]  HPI Here for B12 injection only   Review of Systems     Objective:   Physical Exam        Assessment & Plan:

## 2011-04-15 ENCOUNTER — Telehealth: Payer: Self-pay | Admitting: *Deleted

## 2011-04-15 NOTE — Telephone Encounter (Signed)
Opened in error

## 2011-04-16 ENCOUNTER — Telehealth: Payer: Self-pay | Admitting: *Deleted

## 2011-04-16 NOTE — Telephone Encounter (Signed)
Go ahead and keep appt with ortho since she has it tomorrow I think either could help the problem

## 2011-04-16 NOTE — Telephone Encounter (Signed)
Patient says that about a year ago she hurt her arm shoveling dirt. She says that now for the past few days she has been having pain in her arm when she moves it. She has an appt with an orthopedic dr. At [facility] tomorrow. She is asking if you think she should cancel the appt and come here to see Dr. Patsy Lager or if you feel that seeing the orthopedic dr. Is the way to go.

## 2011-04-16 NOTE — Telephone Encounter (Signed)
Message left notifying patient to keep scheduled appt since she already has it scheduled. Advised to call with any questions or concerns.

## 2011-04-22 ENCOUNTER — Telehealth: Payer: Self-pay | Admitting: Family Medicine

## 2011-04-22 DIAGNOSIS — E559 Vitamin D deficiency, unspecified: Secondary | ICD-10-CM

## 2011-04-22 DIAGNOSIS — E785 Hyperlipidemia, unspecified: Secondary | ICD-10-CM

## 2011-04-22 DIAGNOSIS — E538 Deficiency of other specified B group vitamins: Secondary | ICD-10-CM

## 2011-04-22 DIAGNOSIS — Z0000 Encounter for general adult medical examination without abnormal findings: Secondary | ICD-10-CM

## 2011-04-22 NOTE — Telephone Encounter (Signed)
Message copied by Roxy Manns on Mon Apr 22, 2011  9:00 PM ------      Message from: Melody Comas      Created: Mon Apr 22, 2011  8:53 AM      Regarding: Lab orders       Patient is coming in for cpx labs tomorrow. Please put orders in. Thanks.

## 2011-04-23 ENCOUNTER — Other Ambulatory Visit (INDEPENDENT_AMBULATORY_CARE_PROVIDER_SITE_OTHER): Payer: 59 | Admitting: Family Medicine

## 2011-04-23 DIAGNOSIS — Z13220 Encounter for screening for lipoid disorders: Secondary | ICD-10-CM

## 2011-04-23 DIAGNOSIS — E538 Deficiency of other specified B group vitamins: Secondary | ICD-10-CM

## 2011-04-23 DIAGNOSIS — Z0000 Encounter for general adult medical examination without abnormal findings: Secondary | ICD-10-CM

## 2011-04-23 DIAGNOSIS — E559 Vitamin D deficiency, unspecified: Secondary | ICD-10-CM

## 2011-04-23 DIAGNOSIS — E785 Hyperlipidemia, unspecified: Secondary | ICD-10-CM

## 2011-04-23 LAB — LIPID PANEL
Cholesterol: 204 mg/dL — ABNORMAL HIGH (ref 0–200)
HDL: 43.8 mg/dL (ref >39.00)
Triglycerides: 120 mg/dL (ref 0.0–149.0)
VLDL: 24 mg/dL (ref 0.0–40.0)

## 2011-04-23 LAB — TSH: TSH: 2.43 u[IU]/mL (ref 0.35–5.50)

## 2011-04-23 LAB — CBC WITH DIFFERENTIAL/PLATELET
Basophils Absolute: 0 10*3/uL (ref 0.0–0.1)
Eosinophils Absolute: 0 10*3/uL (ref 0.0–0.7)
HCT: 33.9 % — ABNORMAL LOW (ref 36.0–46.0)
Lymphs Abs: 0.7 10*3/uL (ref 0.7–4.0)
MCHC: 33.9 g/dL (ref 30.0–36.0)
MCV: 79.6 fl (ref 78.0–100.0)
Monocytes Absolute: 0.4 10*3/uL (ref 0.1–1.0)
Neutrophils Relative %: 71.4 % (ref 43.0–77.0)
Platelets: 251 10*3/uL (ref 150.0–400.0)
RDW: 15.5 % — ABNORMAL HIGH (ref 11.5–14.6)

## 2011-04-23 LAB — COMPREHENSIVE METABOLIC PANEL
Alkaline Phosphatase: 59 U/L (ref 39–117)
BUN: 18 mg/dL (ref 6–23)
Glucose, Bld: 85 mg/dL (ref 70–99)
Sodium: 140 mEq/L (ref 135–145)
Total Bilirubin: 0.8 mg/dL (ref 0.3–1.2)

## 2011-04-23 NOTE — Assessment & Plan Note (Signed)
Pamela Anthony, Pamela Anthony              ACCOUNT NO.:  [id number]   MEDICAL RECORD NO.:  [id number]          PATIENT TYPE:  POB   LOCATION:  [facility] at [facility]         FACILITY:  [facility]   PHYSICIAN:  Elsie Lincoln, MD      DATE OF BIRTH:  Feb 21, 1956   DATE OF SERVICE:                                  CLINIC NOTE   CLINIC NOTE:  The patient is a 55 year old female who presents  complaining of increased symptoms of uterine prolapse, increased urinary  urgency when walking on the treadmill.  She has recently become more  active and she finds that once she empties her bladder then walks on the  treadmill for 15 minutes she feels like thinks are falling out and she  has to urgently use the bath room again.  Her primary care Lizzet Hendley  increased her dose of VesiCARE, which did not help.  She thinks that she  would like a surgical option, as we had discussed in her yearly exam.  The patient has not done any vigorous exercise like jumping jacks or  jumping aerobic procedures.  She is having stress symptoms, but there  may be some component of stress incontinence, as well as urge  incontinence.  On physical examination, again, approximately grade 2  uterine prolapse.  The patient does have a moderate cystocele and a mild  rectocele.  She does have a problem with constipation and it is probably  when the stool builds up in the rectum and she is unable to expel it.  The patient needs to be sent for urodynamics to find out exactly what  type of incontinence she has.  I will sent her to Dr. Lorin Picket McDiarmid so  that he can adequately assess her urinary problems and possibly we could  do a joint procedure together.  Return to the clinic in three weeks, or  after she sees Dr. Lorin Picket McDiarmid.           ______________________________  Elsie Lincoln, MD     KL/MEDQ  D:  05/07/2007  T:  05/07/2007  Job:  [id number]

## 2011-04-23 NOTE — Assessment & Plan Note (Signed)
[facility]                         GASTROENTEROLOGY OFFICE NOTE   MAEBEL, MARASCO                       MRN:          [id number]  DATE:02/02/2008                            DOB:          Oct 03, 1956    Mrs. Digirolamo is a 55 year old white female who has had some asymptomatic  rectal bleeding associated with severe worsening constipation over the  last several weeks.  She has had a moderate amount of bright red blood  with her hard bowel movements, and has some generalized abdominal  cramping, gas and bloating.  Her last colonoscopy exam was approximately  four to five years ago and was unremarkable except for some small  hyperplastic polyps, which were excised.  She does take Protonix daily  because of chronic acid reflux symptoms.   FAMILY HISTORY:  Noncontributory.   PAST MEDICAL HISTORY:  Remarkable for hyperlipidemia, and she is on  Vytorin 10-20 mg a day, Vesicare 10 mg a day, calcium daily, fish oil  daily, has recently been trying some Align probiotic therapy.  She  denies any specific food intolerances.   She is a healthy, attractive appearing, white female in no distress.  She weighs 221 pounds.  Blood pressure 122/90 and pulse was 82, regular.  There was no hepatosplenomegaly, abdominal masses or tenderness.  Bowel  sounds were normal.  Inspection of the rectum showed some external  hemorrhoidal tissue, but otherwise was unremarkable without any definite  fissure or fistula.  Rectal exam showed no masses or tenderness, but  very hard, almost impacted stool in the rectal vault  Stool guaiac  negative.   ASSESSMENT:  1. Mrs. Mossbarger has had worsening constipation recently for reasons      which are unclear since she has had no major change in her dietary      therapy or new meds or change in clinical status.  She is due for      followup colonoscopy exam.  2. Chronic gastroesophageal reflux disease, doing well on Protonix       therapy.   RECOMMENDATIONS:  1. High fiber diet with daily Benefiber q.a.m. and liberal p.o.      fluids.  2. MiraLax 8 oz at bedtime.  3. Consider Amitiza therapy depending on clinical course.  4. Local anal care with sitz baths and Analpram cream  5. Outpatient colonoscopy exam at her convenience.  6. Check CBC and anemia profile, and other screening laboratory      parameters.  7. Continue other medications as listed above.     Vania Rea. Jarold Motto, MD, Caleen Essex, FAGA  Electronically Signed    DRP/MedQ  DD: 02/02/2008  DT: 02/02/2008  Job #: [id number]   cc:   Marne A. Milinda Antis, MD

## 2011-04-26 NOTE — Assessment & Plan Note (Signed)
Pamela Anthony, Pamela Anthony              ACCOUNT NO.:  [id number]   MEDICAL RECORD NO.:  [id number]          PATIENT TYPE:  POB   LOCATION:  [facility]         FACILITY:  [facility]   PHYSICIAN:  Elsie Lincoln, MD      DATE OF BIRTH:  [birth year]   DATE OF SERVICE:  02/12/2007                                  CLINIC NOTE   The patient is a 55 year old G2 Para 2,0,0,2, LMP January 22, 2007.  She presents for her yearly exam.  She is a new patient to our practice.  She had recently got care with Dr. __________ but she is retired and she  wanted to get a doctor closer to her home.  She is extremely happy, has  no complaints.  She does have urinary incontinence that is relieved with  VESIcare secondary to the urge incontinence, she has no stress  incontinence.  She is sexually active with no problems.   PAST MEDICAL HISTORY:  1. High cholesterol.  2. Headaches.  3. Urinary incontinence.   PAST SURGICAL HISTORY:  1. D&C.  2. Hand surgery.  3. C-section and tubal   PHYSICAL HISTORY:  A C-section and one vaginal delivery.  Had a tubal  for contraception, she has never had an ovarian cyst, fibroid tumors, or  sexually transmitted diseases.  She never had an abnormal Pap smear.   HEALTH MAINTENANCE:  Her mammogram per the patient was normal in January  from [facility].  She had a colonoscopy in 2005  secondary to blood in her stools, __________ .  She has had her TSH  checked in the past and it is normal.   FAMILY HISTORY:  Father has heart disease, mother has high blood  pressure, and grandmother has had cancer.   SOCIAL HISTORY:  No drinking, drugs, or smoking.  Works outside the  home.   ALLERGIES:  DENIES.   REVIEW OF SYMPTOMS:  Positive for night sweats, weight gain, headaches,  and problems with vision.  The problems with vision is related to the  Vytorin.   MEDICATIONS:  VESIcare, Nexium, Topamax, and Vytorin.   GENERAL:  Well-nourished, well-developed,  in no apparent distress.  Pulse 94, blood pressure 112/90, weight 215.  HEENT:  Normocephalic/atraumatic, thyroid normal.  LUNGS:  Clear to auscultation bilaterally.  HEART:  Regular rate and rhythm.  BREASTS:  No masses, no discharge or skin changes, no lymphadenopathy.  ABDOMEN:  Soft, nontender, no organomegaly, no hernia.  GENITALIA:  Tanner 5, vagina slightly atrophic, cervix closed, uterus  about grade 2 prolapse.  Adnexa no masses, nontender.  Urethra and  bladder are nontender.  RECTUM:  Intact with good tone.  EXTREMITIES:  Nontender, no edema.   ASSESSMENT/PLAN:  A 55 year old female with normal gynecological exam.  1. Pap smear.  2. FSH.  3. Keep primary care doctor for all other health care maintenance.  4. Return to clinic in a year, we will call with the [facility] results.           ______________________________  Elsie Lincoln, MD     KL/MEDQ  D:  02/12/2007  T:  02/12/2007  Job:  [id number]

## 2011-04-26 NOTE — Op Note (Signed)
[name], Pamela Anthony               ACCOUNT NO.:  [id number]   MEDICAL RECORD NO.:  [id number]          PATIENT TYPE:  AMB   LOCATION:  ENDO                         FACILITY:  [facility]   PHYSICIAN:  Vania Rea. Jarold Motto, M.D. [facility] OF BIRTH:  [birth year]   DATE OF PROCEDURE:  01/14/2005  DATE OF DISCHARGE:                                 OPERATIVE REPORT   PROCEDURE:  Esophageal manometry.   Esophageal manometry was completed on January 14, 2005.  Results are as  follows:   1.  Upper esophageal sphincter:  There appears to be normal upper esophageal      motility contractions and normal relaxation to swallow in the      cricopharyngeus muscle.   1.  Lower esophageal sphincter mean pressure is decreased to 10 mmHg with      normal relaxation to swallowing.   1.  Motility pattern, normally propagated peristaltic waves throughout the      length of the esophagus, both wet and dry swallows.  The mean amplitude      of contractions is approximately 80 mmHg.   ASSESSMENT:  This manometry shows an incompetent lower esophageal sphincter  but no evidence of an esophageal motility disorder.  This is the type of  pattern one sees with gastroesophageal reflux disease.   RECOMMENDATIONS:  The patient is to continue PPI therapy with office follow  up as needed.      DRP/MEDQ  D:  01/15/2005  T:  01/15/2005  Job:  [id number]

## 2011-04-26 NOTE — Op Note (Signed)
[name], [name]                           ACCOUNT NO.:  [id number]   MEDICAL RECORD NO.:  [id number]                   PATIENT TYPE:  AMB   LOCATION:  [facility]                                 FACILITY:  [facility]   PHYSICIAN:  Pershing Cox, M.D.            DATE OF BIRTH:  January 22, 1956   DATE OF PROCEDURE:  01/23/2004  DATE OF DISCHARGE:                                 OPERATIVE REPORT   PREOPERATIVE DIAGNOSIS:  Endometrial polyp.   POSTOPERATIVE DIAGNOSIS:  Solitary left cornual endometrial polyp.   PROCEDURES:  1. Examination under anesthesia.  2. Fractional dilatation and curettage.  3. Hysteroscopy with polyp resection.   ANESTHESIA:  General by LMA.   SURGEON:  Pershing Cox, M.D.   INDICATION FOR PROCEDURE:  Pamela Anthony is a patient who has been followed  in our office for some time.  She has begun to have some midcycle spotting,  which is a new problem since late 2004.  Sonogram and hydrosonogram showed a  filling defect consisting of a polyp, and she is brought to the operating  room today for resection of this polyp.   OPERATIVE FINDINGS:  The patient's endometrial cavity sounded to a depth of  10 cm.  There was a solitary endometrial polyp arising from the left cornu.  There was scattered endometrium.  Otherwise the findings were normal.   DESCRIPTION OF PROCEDURE:  Pamela Blase was brought to the operating room with an  IV in place.  She had received 1 g of Ancef in the holding area for  antibiotic prophylaxis.  Supine on the OR table, IV sedation was  administered.  She was then placed into Allen stirrups and prepped with  Hibiclens and draped for a sterile vaginal procedure.  A bivalve speculum  was inserted into the vagina.  Marcaine 0.25% was injected into the anterior  cervix, which was grasped with a single-tooth tenaculum.  Marcaine  paracervical block was administered at the 3, 4, 7, and 8 positions using a  total volume of 20 mL of this 0.25% solution.   Next, a Kevorkian curette was  used to obtain endocervical curettings.  The uterine sound then passed to a  depth of 10 cm as noted above.  Serial Pratt dilators were used to dilate  the cervix to size 31.  The resectoscope was introduced into the endometrial  cavity and using a right angle wire with through-and-through sorbitol  irrigation, the polyp was resected at its base.  A second pass was necessary  to resect the base of the polyp.  A small sharp curette was then used to  serially curette the endometrial lining.  These curettings were placed on  two Telfa and submitted with the polyp for our entire specimen.  The sound  passed again to a depth of 10 cm and the procedure was terminated.  The  patient was taken to the recovery room in good condition.  Pershing Cox, M.D.   MAJ/MEDQ  D:  01/23/2004  T:  01/23/2004  Job:  [id number]

## 2011-04-27 ENCOUNTER — Encounter: Payer: Self-pay | Admitting: Family Medicine

## 2011-04-29 ENCOUNTER — Ambulatory Visit (INDEPENDENT_AMBULATORY_CARE_PROVIDER_SITE_OTHER): Payer: 59 | Admitting: Family Medicine

## 2011-04-29 ENCOUNTER — Encounter: Payer: Self-pay | Admitting: Family Medicine

## 2011-04-29 VITALS — BP 100/70 | HR 84 | Temp 98.4°F | Ht 66.75 in | Wt 212.8 lb

## 2011-04-29 DIAGNOSIS — E785 Hyperlipidemia, unspecified: Secondary | ICD-10-CM

## 2011-04-29 DIAGNOSIS — E559 Vitamin D deficiency, unspecified: Secondary | ICD-10-CM

## 2011-04-29 DIAGNOSIS — K449 Diaphragmatic hernia without obstruction or gangrene: Secondary | ICD-10-CM

## 2011-04-29 DIAGNOSIS — D509 Iron deficiency anemia, unspecified: Secondary | ICD-10-CM

## 2011-04-29 DIAGNOSIS — Z1231 Encounter for screening mammogram for malignant neoplasm of breast: Secondary | ICD-10-CM

## 2011-04-29 DIAGNOSIS — Z0000 Encounter for general adult medical examination without abnormal findings: Secondary | ICD-10-CM

## 2011-04-29 DIAGNOSIS — Z87898 Personal history of other specified conditions: Secondary | ICD-10-CM

## 2011-04-29 DIAGNOSIS — E538 Deficiency of other specified B group vitamins: Secondary | ICD-10-CM

## 2011-04-29 MED ORDER — TOPIRAMATE 100 MG PO TABS: 100.0000 mg | ORAL_TABLET | Freq: Every day | ORAL | Status: DC

## 2011-04-29 MED ORDER — CYANOCOBALAMIN 1000 MCG/ML IJ SOLN
1000.0000 ug | Freq: Once | INTRAMUSCULAR | Status: AC
  Administered 2011-04-29: 1000 ug via INTRAMUSCULAR

## 2011-04-29 NOTE — Assessment & Plan Note (Signed)
Rev labs- level is stable  B12 shot today

## 2011-04-29 NOTE — Assessment & Plan Note (Signed)
Likely from menses  colonosc utd  mvi with iron Continue to follow

## 2011-04-29 NOTE — Assessment & Plan Note (Signed)
Pt has hx of CAD in family  High chol with LDL in 170s Fair diet - disc this in detail  Only tolerates vytorin but cannot afford at this time Will let me know when she is able to afford this I stressed imp of chol control

## 2011-04-29 NOTE — Assessment & Plan Note (Signed)
Is quite stable with this on topamax - so will start coming here instead of [facility] Renal labs stable No side eff Disc lifestyle  Will return to Dr Neale Burly in future if needed

## 2011-04-29 NOTE — Assessment & Plan Note (Addendum)
Reviewed health habits including diet and exercise and skin cancer prevention Also reviewed health mt list, fam hx and immunizations  Rev wellness labs in detail today Disc starting exercise program  As soon as she is post menopausal- consider dexa (has gerd and D def)

## 2011-04-29 NOTE — Progress Notes (Signed)
Subjective:    Patient ID: Pamela Anthony, female    DOB: [birth year], 55 y.o.   MRN: [id number]  HPI Here for health mt exam and to disc chronic med problems Is feeling fair overall Lots of stress in past few years -- husband is disabled with heart dz   Still having a lot of problems with arm pain - went to [facility] and had a shot of cortisone - did not help  Has not had an x ray == elbow pain and clicking  Considering appt with Dr Patsy Lager   Pap was 5/11 nl (with yeast) Never had abn pap- and no symptoms  Still having peroids regularly- occ misses one  Is anxious to have them stop  occ heavy  Uses rephresh product to help vaginal ph - that works   Mam 4/11 normal-- needs to schedule it  Self exam-- no lumps or changes   Td 05 up to date  colonosc 3/09 - hemorroids-otherwise nl No fam hx of colon cancer   Hb is 11.5- lower than usual- she is exhausted all the time (and this is not new)  Tries to exercise -- but cannot fit it in   Lipids are high with HDL 43 and LDL 170 Non tol of lipitor and then tried vytorin (cannot afford it )   B12 is theraputic and stable   Vit D is 30 Takes 4000 iu daily and also ca with D  Also on prilosec  She needs refil of topamax --is very stable with her headaches -- and in good control Wants to stop seeing Dr Neale Burly and get refils here since she is so stable  Past Medical History  Diagnosis Date  . Allergic rhinitis   . Iron deficiency anemia   . GERD (gastroesophageal reflux disease)     EGD 10/2004  . Hyperlipemia   . Low back pain   . Urinary incontinence   . Obesity   . B12 deficiency   . Vitamin D deficiency     mild  . Degenerative disc disease     back  . Fungal infection 2005    lamasil   . Hemorrhoids, internal     colonoscopy 02/2008    Past Surgical History  Procedure Date  . Cesarean section 1985  . Nasal sinus surgery 2000's    right maxillary  . Tonsillectomy and adenoidectomy   . Knee surgery  1970's    right  . Hand surgery 2000    nodule removed  . Dilation and curettage of uterus 2005    History   Social History  . Marital Status: Married    Spouse Name: N/A    Number of Children: N/A  . Years of Education: N/A   Occupational History  . Not on file.   Social History Main Topics  . Smoking status: Never Smoker   . Smokeless tobacco: Not on file  . Alcohol Use: Yes     occasional  . Drug Use: Not on file  . Sexually Active: Not on file   Other Topics Concern  . Not on file   Social History Narrative  . No narrative on file    Family History  Problem Relation Age of Onset  . Coronary artery disease Father     in his 72's  . Arthritis Father   . Stroke Father   . Heart disease Father     pacemaker    Allergies  Allergen Reactions  . Atorvastatin  REACTION: myalgias  . Oxybutynin     REACTION: rash        Review of Systems Review of Systems  Constitutional: Negative for fever, appetite change, and unexpected weight change. pos for fatigue Eyes: Negative for pain and visual disturbance.  Respiratory: Negative for cough and shortness of breath.   Cardiovascular: Negative.for cp or sob    Gastrointestinal: Negative for nausea, diarrhea and constipation.  Genitourinary: Negative for urgency and frequency.  Skin: Negative for pallor or rash MSK pos for joint pain in shoulder .  Neurological: Negative for weakness, light-headedness, numbness and headaches.  Hematological: Negative for adenopathy. Does not bruise/bleed easily.  Psychiatric/Behavioral: Negative for dysphoric mood. The patient is not nervous/anxious.          Objective:   Physical Exam  Constitutional: She appears well-developed and well-nourished.       overwt and well appearing   HENT:  Head: Normocephalic and atraumatic.  Right Ear: External ear normal.  Left Ear: External ear normal.  Nose: Nose normal.  Mouth/Throat: Oropharynx is clear and moist.  Eyes:  Conjunctivae and EOM are normal. Pupils are equal, round, and reactive to light. Right eye exhibits no discharge. Left eye exhibits no discharge.  Neck: Normal range of motion. Neck supple. No JVD present. Carotid bruit is not present. No thyromegaly present.  Cardiovascular: Normal rate, regular rhythm and normal heart sounds.   Pulmonary/Chest: Effort normal and breath sounds normal. No respiratory distress. She has no wheezes.  Abdominal: Soft. Bowel sounds are normal. She exhibits no distension, no abdominal bruit and no mass. There is no tenderness.  Genitourinary: No breast swelling, tenderness, discharge or bleeding.  Musculoskeletal: She exhibits no edema and no tenderness.       Poor rom both shoulders   Lymphadenopathy:    She has no cervical adenopathy.  Neurological: She is alert. She has normal reflexes. No cranial nerve deficit. Coordination normal.  Skin: Skin is warm and dry.  Psychiatric: She has a normal mood and affect.          Assessment & Plan:

## 2011-04-29 NOTE — Patient Instructions (Signed)
Please schedule appt with Dr Patsy Lager for arm pain We will schedule mammogram at check out  Start multivitamin with iron daily for mild anemia  Continue current vit D dose Work hard at incorporating exercise into schedule If /when you want to go back on chol med (vytorin) let me know  Avoid red meat/ fried foods/ egg yolks/ fatty breakfast meats/ butter, cheese and high fat dairy/ and shellfish

## 2011-04-29 NOTE — Assessment & Plan Note (Signed)
This is fairly controlled with over 4000 iu daily Will continue this and get a bit of outdoor time

## 2011-04-29 NOTE — Assessment & Plan Note (Signed)
Nl exam today Enc self exams sched mam at [facility]

## 2011-04-30 ENCOUNTER — Other Ambulatory Visit: Payer: 59

## 2011-05-08 ENCOUNTER — Encounter: Payer: Self-pay | Admitting: Family Medicine

## 2011-05-08 ENCOUNTER — Ambulatory Visit (INDEPENDENT_AMBULATORY_CARE_PROVIDER_SITE_OTHER): Payer: 59 | Admitting: Family Medicine

## 2011-05-08 ENCOUNTER — Ambulatory Visit (INDEPENDENT_AMBULATORY_CARE_PROVIDER_SITE_OTHER)
Admission: RE | Admit: 2011-05-08 | Discharge: 2011-05-08 | Disposition: A | Discharge status: Home / Self Care | Payer: 59 | Source: Ambulatory Visit | Attending: Family Medicine | Admitting: Family Medicine

## 2011-05-08 VITALS — BP 110/80 | HR 90 | Temp 98.6°F | Ht 66.75 in | Wt 215.4 lb

## 2011-05-08 DIAGNOSIS — M25529 Pain in unspecified elbow: Secondary | ICD-10-CM

## 2011-05-08 DIAGNOSIS — M25522 Pain in left elbow: Secondary | ICD-10-CM

## 2011-05-08 NOTE — Progress Notes (Signed)
55 year old female with left arm pain seen at the request of Dr. Milinda Antis for evaluation of arm and elbow pain:  May have hurt it about a year and a half ago, and it went away back then. In the past month or so, will start to nag and hurt. From the wrist to the elbow, always nagging pain. Saw [facility] - saw Dedra Skeens, PA. Had a LE cortisone injection. This did not help at all.  Patient does have some mechanical clicking and around the radial head. She also has pain in the volar aspect and the dorsal aspect of the entire forearm. She also feels as if she has some objective decrease in strength with her grip.  Patient Active Problem List  Diagnoses  . POLYP, COLON  . B12 DEFICIENCY  . VITAMIN D DEFICIENCY  . HYPERLIPIDEMIA  . ANEMIA-IRON DEFICIENCY  . STRESS REACTION, ACUTE, WITH EMOTIONAL DISTURBANCE  . ALLERGIC RHINITIS  . GERD  . HIATAL HERNIA  . IRRITABLE BOWEL SYNDROME  . ARTHRALGIA  . NECK PAIN  . LOW BACK PAIN  . SLEEP APNEA  . FATIGUE  . URINARY INCONTINENCE  . MIGRAINES, HX OF  . Routine general medical examination at a health care facility  . Other screening mammogram  . Left elbow pain   Past Medical History  Diagnosis Date  . Allergic rhinitis   . Iron deficiency anemia   . GERD (gastroesophageal reflux disease)     EGD 10/2004  . Hyperlipemia   . Low back pain   . Urinary incontinence   . Obesity   . B12 deficiency   . Vitamin D deficiency     mild  . Degenerative disc disease     back  . Fungal infection 2005    lamasil   . Hemorrhoids, internal     colonoscopy 02/2008   Past Surgical History  Procedure Date  . Cesarean section 1985  . Nasal sinus surgery 2000's    right maxillary  . Tonsillectomy and adenoidectomy   . Knee surgery 1970's    right  . Hand surgery 2000    nodule removed  . Dilation and curettage of uterus 2005   History  Substance Use Topics  . Smoking status: Never Smoker   . Smokeless tobacco: Not on file  . Alcohol Use: Yes       occasional   Family History  Problem Relation Age of Onset  . Coronary artery disease Father     in his 57's  . Arthritis Father   . Stroke Father   . Heart disease Father     pacemaker   Allergies  Allergen Reactions  . Atorvastatin     REACTION: myalgias  . Oxybutynin     REACTION: rash   Current Outpatient Prescriptions on File Prior to Visit  Medication Sig Dispense Refill  . Calcium Carbonate-Vitamin D (CALTRATE 600+D) 600-400 MG-UNIT per tablet Take 1 tablet by mouth daily.        . cholecalciferol (VITAMIN D) 1000 UNITS tablet Takes 4 tablets daily       . cyanocobalamin (,VITAMIN B-12,) 1000 MCG/ML injection One injection every 6 weeks.       . cyclobenzaprine (FLEXERIL) 10 MG tablet Take 10 mg by mouth 3 (three) times daily as needed. For muscle spasm and pain       . FLUoxetine (PROZAC) 10 MG tablet Take one half tablet by mouth daily       . FLUoxetine (PROZAC) 20  MG tablet Take 10 mg by mouth daily.        . hydrochlorothiazide 25 MG tablet Take one half to one by mouth once daily as needed       . meloxicam (MOBIC) 15 MG tablet Take one by mouth once daily with food as needed for back pain, do not mix with other nsaids.       . Omega-3 Fatty Acids (FISH OIL) 1200 MG CAPS Take 1 capsule by mouth daily.        Marland Kitchen omeprazole (PRILOSEC) 20 MG capsule Take 20 mg by mouth daily.        . solifenacin (VESICARE) 10 MG tablet Take 10 mg by mouth daily.       Marland Kitchen topiramate (TOPAMAX) 100 MG tablet Take 1 tablet (100 mg total) by mouth daily.  30 tablet  11   REVIEW OF SYSTEMS  GEN: No fevers, chills. Nontoxic. Primarily MSK c/o today. MSK: Detailed in the HPI GI: tolerating PO intake without difficulty Neuro: No numbness, parasthesias, or tingling associated. Otherwise the pertinent positives of the ROS are noted above.    Physical Exam  Blood pressure 110/80, pulse 90, temperature 98.6 F (37 C), temperature source Oral, height 5' 6.75" (1.695 m), weight 215 lb 6.4  oz (97.705 kg), last menstrual period 04/17/2011, SpO2 99.00%.  GEN: Well-developed,well-nourished,in no acute distress; alert,appropriate and cooperative throughout examination HEENT: Normocephalic and atraumatic without obvious abnormalities. Ears, externally no deformities PULM: Breathing comfortably in no respiratory distress EXT: No clubbing, cyanosis, or edema PSYCH: Normally interactive. Cooperative during the interview. Pleasant. Friendly and conversant. Not anxious or depressed appearing. Normal, full affect.  LEFT elbow Ecchymosis or edema: neg ROM: full flexion, extension, pronation, supination, BUT THERE IS MECHANICAL CLICK AT RADIAL HEAD MILD TENDERNESS VOLARLY DISTAL TO ELBOW AND DORSALLY Shoulder ROM: Full Flexion: 5/5 Extension: 5/5 Supination: 5/5  Pronation: 5/5 Wrist ext: 5/5 Wrist flexion: 5/5 AT WRIST, GRIP IS 4+/5 COMPARED TO RIGHT AND FINGER ABDUCTION 4+/5 No gross bony abnormality Varus and Valgus stress: stable ECRB tenderness: neg Medial epicondyle: NT Lateral epicondyle, resisted wrist extension from wrist full pronation and flexion: NT  sensation intact Tinel's, Elbow: negative  A/P: Left elbow pain. There certainly is a Mechanical click currently. This may be all arthritic change versus potential loose body at the elbow.  Obtain an x-ray series of the left elbow.  Recommendations: This is not consistent with lateral epicondylitis. Patient does have some diffuse weakness of her volar and dorsal compartments. Grip strength is decreased. We reviewed Dr. Caralee Ates elbow program, and I think that we need to pay close attention to maintaining her range of motion, particularly with flexion, extension, and terminal motions with supination pronation. I am optimistic that she will improve with basic rehabilitation. Right now she is very weak, and I think she has multiple levels of chronic tendinopathy at play throughout the forearm.  Cc: Dr. Milinda Antis

## 2011-05-08 NOTE — Patient Instructions (Signed)
Alleve 2 tabs by mouth two tmes a day over the counter: Take at least for 2 - 3 weeks. This is equal to a prescripton strength dose (GENERIC CHEAPER EQUIVALENT IS NAPROXEN SODIUM)

## 2011-05-09 ENCOUNTER — Ambulatory Visit: Payer: 59

## 2011-05-15 ENCOUNTER — Ambulatory Visit: Payer: Self-pay | Admitting: Family Medicine

## 2011-05-23 ENCOUNTER — Other Ambulatory Visit: Payer: Self-pay | Admitting: Family Medicine

## 2011-05-23 NOTE — Telephone Encounter (Signed)
Patient notified as instructed by telephone. Pt said she only wanted to see Dr Milinda Antis so she will call back tomorrow afternoon to schedule appt [facility].

## 2011-05-23 NOTE — Telephone Encounter (Signed)
Patient says that since wed the right side of her lip has been numb and has a tingling sensation. She also feels a lot of pressure in her ear. She wanted to schedule appt, I offered her appt with Dr. Reece Agar and she said that she will only see you. I told her that you are out of the office today and have no openings for tomorrow. She said that she will only see you and wants to know if you could add her on at some point tomorrow. Please advise.

## 2011-05-23 NOTE — Telephone Encounter (Signed)
I don't think I can because I just  fit in a newborn on Friday I am on call and working sat clinic if she wants to see me sat  Let her know since it is an urgent care clinic there could be a wait depending on how busy it is

## 2011-05-25 ENCOUNTER — Encounter: Payer: Self-pay | Admitting: Family Medicine

## 2011-05-25 ENCOUNTER — Ambulatory Visit (INDEPENDENT_AMBULATORY_CARE_PROVIDER_SITE_OTHER): Payer: 59 | Admitting: Family Medicine

## 2011-05-25 VITALS — BP 110/78 | HR 80 | Temp 97.4°F | Wt 215.0 lb

## 2011-05-25 DIAGNOSIS — R209 Unspecified disturbances of skin sensation: Secondary | ICD-10-CM

## 2011-05-25 DIAGNOSIS — R200 Anesthesia of skin: Secondary | ICD-10-CM

## 2011-05-25 NOTE — Patient Instructions (Signed)
If your symptoms return- go to ER immediately  If you still feel strange on Monday- call the office and I will make a plan for labs and imaging  ? If this could be atypical migraine vs something else

## 2011-05-25 NOTE — Progress Notes (Signed)
Subjective:    Patient ID: Pamela Anthony, female    DOB: 1956-08-10, 55 y.o.   MRN: [id number]  HPI here for facial numbness  Monday -- R half of her lips got numb and having a hard time forming her thoughts (was not slurring)-- thought maybe she was getting  A migraine -- but it never happened  It lasted probably about 30 minutes-- cloudy thoughts (no speech slurring or word finding- just difficult to concentrate) Lips are still a little bit tingly  Some pressure in her R ear and into head  Fingertips were tingly on R -- that got better quick  Was not more stressed than usual that am   ? If change in her vision -- hard to tell - needs glasses No headache today  Not on mobic , not on estrogen of any kind   Has given up caffine entirely- proud of that   Patient Active Problem List  Diagnoses  . POLYP, COLON  . B12 DEFICIENCY  . VITAMIN D DEFICIENCY  . HYPERLIPIDEMIA  . ANEMIA-IRON DEFICIENCY  . STRESS REACTION, ACUTE, WITH EMOTIONAL DISTURBANCE  . ALLERGIC RHINITIS  . GERD  . HIATAL HERNIA  . IRRITABLE BOWEL SYNDROME  . ARTHRALGIA  . NECK PAIN  . LOW BACK PAIN  . SLEEP APNEA  . FATIGUE  . URINARY INCONTINENCE  . MIGRAINES, HX OF  . Routine general medical examination at a health care facility  . Other screening mammogram  . Left elbow pain  . Facial numbness   Past Medical History  Diagnosis Date  . Allergic rhinitis   . Iron deficiency anemia   . GERD (gastroesophageal reflux disease)     EGD 10/2004  . Hyperlipemia   . Low back pain   . Urinary incontinence   . Obesity   . B12 deficiency   . Vitamin D deficiency     mild  . Degenerative disc disease     back  . Fungal infection 2005    lamasil   . Hemorrhoids, internal     colonoscopy 02/2008   Past Surgical History  Procedure Date  . Cesarean section 1985  . Nasal sinus surgery 2000's    right maxillary  . Tonsillectomy and adenoidectomy   . Knee surgery 1970's    right  . Hand surgery 2000   nodule removed  . Dilation and curettage of uterus 2005   History  Substance Use Topics  . Smoking status: Never Smoker   . Smokeless tobacco: Not on file  . Alcohol Use: Yes     occasional   Family History  Problem Relation Age of Onset  . Coronary artery disease Father     in his 46's  . Arthritis Father   . Stroke Father   . Heart disease Father     pacemaker   Allergies  Allergen Reactions  . Atorvastatin     REACTION: myalgias  . Oxybutynin     REACTION: rash   Current Outpatient Prescriptions on File Prior to Visit  Medication Sig Dispense Refill  . Calcium Carbonate-Vitamin D (CALTRATE 600+D) 600-400 MG-UNIT per tablet Take 1 tablet by mouth daily.        . cholecalciferol (VITAMIN D) 1000 UNITS tablet Takes 4 tablets daily       . cyanocobalamin (,VITAMIN B-12,) 1000 MCG/ML injection One injection every 6 weeks.       . cyclobenzaprine (FLEXERIL) 10 MG tablet Take 10 mg by mouth 3 (three) times daily as needed.  For muscle spasm and pain       . FLUoxetine (PROZAC) 20 MG tablet Take 10 mg by mouth daily.        . hydrochlorothiazide 25 MG tablet Take one half to one by mouth once daily as needed       . meloxicam (MOBIC) 15 MG tablet Take one by mouth once daily with food as needed for back pain, do not mix with other nsaids.       . Omega-3 Fatty Acids (FISH OIL) 1200 MG CAPS Take 1 capsule by mouth daily.        Marland Kitchen omeprazole (PRILOSEC) 20 MG capsule TAKE 1 CAPSULE BY MOUTH ONCE DAILY  30 capsule  6  . topiramate (TOPAMAX) 100 MG tablet Take 1 tablet (100 mg total) by mouth daily.  30 tablet  11  . VESICARE 10 MG tablet TAKE 1 TABLET BY MOUTH ONCE DAILY  30 tablet  6  . FLUoxetine (PROZAC) 10 MG tablet Take one half tablet by mouth daily            Review of Systems Review of Systems  Constitutional: Negative for fever, appetite change, fatigue and unexpected weight change.  Eyes: Negative for pain and visual disturbance.  Respiratory: Negative for cough and  shortness of breath.   Cardiovascular: Negative.  for cp or sob or edema or palp Gastrointestinal: Negative for nausea, diarrhea and constipation.  Genitourinary: Negative for urgency and frequency.  Skin: Negative for pallor. or rash  Neurological: Negative for weakness, light-headedness, and headache   -right now symptoms are pretty much resolved .  Hematological: Negative for adenopathy. Does not bruise/bleed easily.  Psychiatric/Behavioral: Negative for dysphoric mood. The patient is not nervous/anxious.  (but has a lot of stress all the time)          Objective:   Physical Exam  Constitutional: She is oriented to person, place, and time. She appears well-developed and well-nourished. No distress.       overwt and well appearing   HENT:  Head: Normocephalic and atraumatic.  Right Ear: External ear normal.  Left Ear: External ear normal.  Nose: Nose normal.  Mouth/Throat: Oropharynx is clear and moist. No oropharyngeal exudate.       No sinus tenderness No temporal artery tenderness  Eyes: Conjunctivae and EOM are normal. Pupils are equal, round, and reactive to light. Right eye exhibits no discharge. Left eye exhibits no discharge.  Neck: Normal range of motion. Neck supple. No JVD present. Carotid bruit is not present. No thyromegaly present.  Cardiovascular: Normal rate, regular rhythm and normal heart sounds.   Pulmonary/Chest: Effort normal and breath sounds normal. No respiratory distress. She has no wheezes.  Abdominal: Soft. Bowel sounds are normal. She exhibits no distension. There is no tenderness.  Musculoskeletal: She exhibits no edema and no tenderness.  Lymphadenopathy:    She has no cervical adenopathy.  Neurological: She is alert and oriented to person, place, and time. She has normal reflexes. She displays no tremor. No cranial nerve deficit or sensory deficit. She exhibits normal muscle tone. She displays a negative Romberg sign. Coordination and gait normal.        No facial droop Speech is normal  Skin: Skin is warm and dry. No rash noted. No erythema. No pallor.  Psychiatric: She has a normal mood and affect.       Cheerful Feels fine now - on her way to go shopping for shoes  Speech normal / thought process  nl           Assessment & Plan:

## 2011-05-25 NOTE — Assessment & Plan Note (Signed)
Facial numbness- now resolving with some ear pressure and brief time of poor concentration Suspect atypical migraine  No TA tenderness or ha - so temporal arteritis is doubtful Rev s/s of tia and stroke  Will update asap if any rash Reassuring exam --  Will update Monday and consider lab/ imaging if no imp  If symptom return- promised to go to[facility] immed

## 2011-06-13 ENCOUNTER — Ambulatory Visit (INDEPENDENT_AMBULATORY_CARE_PROVIDER_SITE_OTHER): Payer: 59 | Admitting: Family Medicine

## 2011-06-13 DIAGNOSIS — E538 Deficiency of other specified B group vitamins: Secondary | ICD-10-CM

## 2011-06-13 MED ORDER — CYANOCOBALAMIN 1000 MCG/ML IJ SOLN
1000.0000 ug | Freq: Once | INTRAMUSCULAR | Status: AC
  Administered 2011-06-13: 1000 ug via INTRAMUSCULAR

## 2011-06-13 NOTE — Progress Notes (Signed)
  Subjective:    Patient ID: Pamela Anthony, female    DOB: 1956-03-23, 55 y.o.   MRN: [id number]  HPI  Here for injection only   Review of Systems     Objective:   Physical Exam        Assessment & Plan:

## 2011-06-19 ENCOUNTER — Other Ambulatory Visit: Payer: Self-pay | Admitting: Family Medicine

## 2011-06-20 ENCOUNTER — Other Ambulatory Visit: Payer: Self-pay | Admitting: *Deleted

## 2011-06-20 ENCOUNTER — Ambulatory Visit: Payer: 59 | Admitting: Family Medicine

## 2011-06-20 MED ORDER — FLUOXETINE HCL 20 MG PO TABS: 10.0000 mg | ORAL_TABLET | Freq: Every day | ORAL | Status: DC

## 2011-06-20 NOTE — Telephone Encounter (Signed)
Please call pt to clarify -- then can refil what she is taking for the year--thanks

## 2011-06-20 NOTE — Telephone Encounter (Signed)
Faxed request for fluoxetine 20 mg's, to take one a day.  Chart has to take one half a day, please advise.

## 2011-06-20 NOTE — Telephone Encounter (Signed)
Patient notified as instructed by telephone. Pt said she takes 1/2 - 1 tablet daily. Med list updated and rx refilled #30 x11 as instructed.

## 2011-07-25 ENCOUNTER — Ambulatory Visit (INDEPENDENT_AMBULATORY_CARE_PROVIDER_SITE_OTHER): Payer: 59 | Admitting: *Deleted

## 2011-07-25 DIAGNOSIS — E538 Deficiency of other specified B group vitamins: Secondary | ICD-10-CM

## 2011-07-25 MED ORDER — CYANOCOBALAMIN 1000 MCG/ML IJ SOLN
1000.0000 ug | Freq: Once | INTRAMUSCULAR | Status: AC
  Administered 2011-07-25: 1000 ug via INTRAMUSCULAR

## 2011-09-05 ENCOUNTER — Ambulatory Visit (INDEPENDENT_AMBULATORY_CARE_PROVIDER_SITE_OTHER): Payer: 59 | Admitting: *Deleted

## 2011-09-05 DIAGNOSIS — E538 Deficiency of other specified B group vitamins: Secondary | ICD-10-CM

## 2011-09-05 DIAGNOSIS — Z23 Encounter for immunization: Secondary | ICD-10-CM

## 2011-09-05 MED ORDER — CYANOCOBALAMIN 1000 MCG/ML IJ SOLN
1000.0000 ug | Freq: Once | INTRAMUSCULAR | Status: AC
  Administered 2011-09-05: 1000 ug via INTRAMUSCULAR

## 2011-09-10 ENCOUNTER — Ambulatory Visit: Payer: 59 | Admitting: Family Medicine

## 2011-10-17 ENCOUNTER — Ambulatory Visit: Payer: 59 | Admitting: *Deleted

## 2011-10-17 DIAGNOSIS — E538 Deficiency of other specified B group vitamins: Secondary | ICD-10-CM

## 2011-10-17 MED ORDER — CYANOCOBALAMIN 1000 MCG/ML IJ SOLN
1000.0000 ug | Freq: Once | INTRAMUSCULAR | Status: AC
  Administered 2011-10-17: 1000 ug via INTRAMUSCULAR

## 2011-10-17 NOTE — Progress Notes (Signed)
  Subjective:    Patient ID: Pamela Anthony, female    DOB: Oct 19, 1956, 55 y.o.   MRN: [id number]  HPI    Review of Systems     Objective:   Physical Exam        Assessment & Plan:  Patient was given b-12 injection at nurse visit today

## 2011-11-26 ENCOUNTER — Ambulatory Visit: Payer: 59

## 2011-11-26 DIAGNOSIS — E538 Deficiency of other specified B group vitamins: Secondary | ICD-10-CM

## 2011-11-26 MED ORDER — CYANOCOBALAMIN 1000 MCG/ML IJ SOLN
1000.0000 ug | Freq: Once | INTRAMUSCULAR | Status: AC
  Administered 2011-11-26: 1000 ug via INTRAMUSCULAR

## 2011-12-20 ENCOUNTER — Other Ambulatory Visit: Payer: Self-pay | Admitting: Family Medicine

## 2011-12-20 NOTE — Telephone Encounter (Signed)
[facility] [address] request refill Vesicare 10 mg # 30 x 6 and Omeprazolwe 20 mg #30 x 6. Pt last seen 05/25/11.

## 2012-01-07 ENCOUNTER — Ambulatory Visit: Payer: 59

## 2012-01-09 ENCOUNTER — Ambulatory Visit (INDEPENDENT_AMBULATORY_CARE_PROVIDER_SITE_OTHER): Payer: 59 | Admitting: Family Medicine

## 2012-01-09 ENCOUNTER — Ambulatory Visit (INDEPENDENT_AMBULATORY_CARE_PROVIDER_SITE_OTHER)
Admission: RE | Admit: 2012-01-09 | Discharge: 2012-01-09 | Disposition: A | Discharge status: Home / Self Care | Payer: 59 | Source: Ambulatory Visit | Attending: Family Medicine | Admitting: Family Medicine

## 2012-01-09 ENCOUNTER — Ambulatory Visit: Payer: 59

## 2012-01-09 ENCOUNTER — Encounter: Payer: Self-pay | Admitting: Family Medicine

## 2012-01-09 VITALS — BP 110/70 | HR 87 | Temp 98.7°F | Ht 66.0 in | Wt 211.1 lb

## 2012-01-09 DIAGNOSIS — M25512 Pain in left shoulder: Secondary | ICD-10-CM

## 2012-01-09 DIAGNOSIS — M25522 Pain in left elbow: Secondary | ICD-10-CM

## 2012-01-09 DIAGNOSIS — M25529 Pain in unspecified elbow: Secondary | ICD-10-CM

## 2012-01-09 DIAGNOSIS — E538 Deficiency of other specified B group vitamins: Secondary | ICD-10-CM

## 2012-01-09 DIAGNOSIS — M25519 Pain in unspecified shoulder: Secondary | ICD-10-CM

## 2012-01-09 MED ORDER — CYANOCOBALAMIN 1000 MCG/ML IJ SOLN
1000.0000 ug | Freq: Once | INTRAMUSCULAR | Status: AC
  Administered 2012-01-09: 1000 ug via INTRAMUSCULAR

## 2012-01-09 NOTE — Progress Notes (Addendum)
Patient Name: Demri Poulton Date of Birth: 06/06/1956 Age: 56 y.o. Medical Record Number: [id number] Gender: female Date of Encounter: 01/09/2012  History of Present Illness:  Nancylee Gaines is a 57 y.o. very pleasant female patient who presents with the following:  Several months of left upper lateral shoulder pain. Pain with abduction and some restriction of motion, particularly with internal and external range of motion. She denies any neck pain. She denies any shoulder blade pain. Now having pain in her upper arm and shoulder. Painful and pain with grabbing and having a burning sensation in her shoulder. Feels and has pain and cracking and really sore. Cannot sleep on her left side. Cannot put hand under pillow.   Left elbow is still bothering her considerably with rotational movements, pronation and supination. She is having some mechanical clicking. I obtained a elbow series in May, 2012, and it was grossly normal. There is no loose bodies and no occult fracture. She has been doing some tennis elbow rehabilitation, and a different provider at another practice get a tennis elbow injection previously for her.  Past Medical History, Surgical History, Social History, Family History, Problem List, Medications, and Allergies have been reviewed and updated if relevant.  Review of Systems:  GEN: No fevers, chills. Nontoxic. Primarily MSK c/o today. MSK: Detailed in the HPI GI: tolerating PO intake without difficulty Neuro: detailed above Otherwise the pertinent positives of the ROS are noted above.    Physical Examination: Filed Vitals:   01/09/12 1144  BP: 110/70  Pulse: 87  Temp: 98.7 F (37.1 C)  TempSrc: Oral  Height: 5\' 6"  (1.676 m)  Weight: 211 lb 1.9 oz (95.763 kg)  SpO2: 98%    Body mass index is 34.08 kg/(m^2).   GEN: WDWN, NAD, Non-toxic, Alert & Oriented x 3 HEENT: Atraumatic, Normocephalic.  Ears and Nose: No external deformity. EXTR: No  clubbing/cyanosis/edema NEURO: Normal gait.  PSYCH: Normally interactive. Conversant. Not depressed or anxious appearing.  Calm demeanor.   Left elbow: Full range of motion. She has not really having any tenderness at the lateral epicondyle. Nontender around the radial tunnel. Nontender the medial epicondyle. She is having a mechanical click is audible and felt with pronation and supplementation around the radial head.  Left shoulder: Nontender along clavicle. Nontender acromioclavicular joint. Moderately tender at the bicipital groove. Tender at the supraspinatus insertion. Painful abduction with pain arc of motion. Negative crossover test. Positive Hawkins test. Positive Kennedy test. Positive Jobe test. Positive Neer test. Positive speeds test.  Assessment and Plan: 1. Left shoulder pain  DG Shoulder Left  2. B12 deficiency  cyanocobalamin ((VITAMIN B-12)) injection 1,000 mcg  3. Left elbow pain  MR Elbow Left Wo Contrast    Left shoulder rotator cuff tendinitis, subacromial bursitis and probable bicipital tendinitis.  Left elbow pain with mechanical clicking and popping. This is been ongoing for 3 years. Negative plain x-rays. Significant impairment in pain with rotational movements, pronation, and subluxation. No neurological symptoms. Obtain an MRI of the left elbow without contrast to evaluate for potential loose body, internal derangement, or other pathology that may need definitive operative management. Depending on results, surgical consultation may be needed.  SubAC Injection, LEFT Verbal consent was obtained from the patient. Risks (including infection), benefits, and alternatives were explained. Patient prepped with Chloraprep and Ethyl Chloride used for anesthesia. The subacromial space was injected using the posterior approach. The patient tolerated the procedure well and had decreased pain post injection. No complications. Injection: 9 cc of  Lidocaine 1% and 1cc of Depo-Medrol 40  mg. Needle: 22 gauge   Addendum:  Patient has had MRI of the elbow at this point, no loose body, some OA, evidence of significant T2 signal at distal biceps. For now, start PT and recheck in the office with me in 3-4 weeks

## 2012-01-11 ENCOUNTER — Ambulatory Visit
Admission: RE | Admit: 2012-01-11 | Discharge: 2012-01-11 | Disposition: A | Discharge status: Home / Self Care | Payer: 59 | Source: Ambulatory Visit | Attending: Family Medicine | Admitting: Family Medicine

## 2012-01-11 DIAGNOSIS — M25522 Pain in left elbow: Secondary | ICD-10-CM

## 2012-01-15 NOTE — Progress Notes (Signed)
Addended by: Hannah Beat on: 01/15/2012 04:43 PM   Modules accepted: Orders

## 2012-01-16 ENCOUNTER — Telehealth: Payer: Self-pay | Admitting: *Deleted

## 2012-01-16 MED ORDER — TRAMADOL HCL 50 MG PO TABS: 50.0000 mg | ORAL_TABLET | Freq: Four times a day (QID) | ORAL | Status: AC | PRN

## 2012-01-16 MED ORDER — MELOXICAM 15 MG PO TABS: 15.0000 mg | ORAL_TABLET | Freq: Every day | ORAL | Status: DC

## 2012-01-16 NOTE — Telephone Encounter (Signed)
Spoke with patient and she is going to try then pain medication and anti inflammatory to see how that does and go ahead and move forward with PT

## 2012-01-16 NOTE — Telephone Encounter (Signed)
Message copied by Consuello Masse on Thu Jan 16, 2012  1:24 PM ------      Message from: Hannah Beat      Created: Thu Jan 16, 2012 11:49 AM       No. We need her tendon to remodel and become normal. Why don't you have her come in early next week and I can go over her MRI and explain the pathophysiology. Not easily explained over phone.            ----- Message -----         From: Carlton Adam         Sent: 01/16/2012  10:58 AM           To: Hannah Beat, MD            Dr C, I called the pt to set up her Therapy and she had many questions for you. She has been taking Aleve for her pain and it isnt helping, what can you prescribe for her that will help. Isnt the Therapy gonna aggravate her condition due to the fluid and the inflamation that you said the MRI saw? Wouldn't immobilizing it help more due to the inflammation?. Please have Rollie Hynek call her back if you can with your reply or with what meds you can offer her for inflammation and pain. Thanks, Shirlee Limerick

## 2012-01-21 ENCOUNTER — Encounter: Payer: Self-pay | Admitting: Family Medicine

## 2012-02-07 ENCOUNTER — Encounter: Payer: Self-pay | Admitting: Family Medicine

## 2012-02-20 ENCOUNTER — Ambulatory Visit (INDEPENDENT_AMBULATORY_CARE_PROVIDER_SITE_OTHER): Payer: 59 | Admitting: *Deleted

## 2012-02-20 DIAGNOSIS — E538 Deficiency of other specified B group vitamins: Secondary | ICD-10-CM

## 2012-02-20 MED ORDER — CYANOCOBALAMIN 1000 MCG/ML IJ SOLN
1000.0000 ug | Freq: Once | INTRAMUSCULAR | Status: AC
  Administered 2012-02-20: 1000 ug via INTRAMUSCULAR

## 2012-02-24 ENCOUNTER — Ambulatory Visit (INDEPENDENT_AMBULATORY_CARE_PROVIDER_SITE_OTHER): Payer: 59 | Admitting: Family Medicine

## 2012-02-24 ENCOUNTER — Encounter: Payer: Self-pay | Admitting: Family Medicine

## 2012-02-24 VITALS — BP 120/72 | HR 100 | Temp 98.6°F | Ht 68.0 in | Wt 211.8 lb

## 2012-02-24 DIAGNOSIS — M7540 Impingement syndrome of unspecified shoulder: Secondary | ICD-10-CM

## 2012-02-24 DIAGNOSIS — M719 Bursopathy, unspecified: Secondary | ICD-10-CM

## 2012-02-24 DIAGNOSIS — M25512 Pain in left shoulder: Secondary | ICD-10-CM

## 2012-02-24 DIAGNOSIS — M25529 Pain in unspecified elbow: Secondary | ICD-10-CM

## 2012-02-24 DIAGNOSIS — M25522 Pain in left elbow: Secondary | ICD-10-CM

## 2012-02-24 DIAGNOSIS — M25519 Pain in unspecified shoulder: Secondary | ICD-10-CM

## 2012-02-24 NOTE — Patient Instructions (Signed)
REFERRAL: GO THE THE FRONT ROOM AT THE ENTRANCE OF OUR CLINIC, NEAR CHECK IN. ASK FOR MARION. SHE WILL HELP YOU SET UP YOUR REFERRAL. DATE: TIME:  

## 2012-02-24 NOTE — Progress Notes (Signed)
Patient Name: Pamela Anthony Date of Birth: [birth year] Age: 56 y.o. Medical Record Number: [id number] Gender: female Date of Encounter: 02/24/2012  History of Present Illness:  Pamela Anthony is a 56 y.o. very pleasant female patient who presents with the following:  F/u L shoulder and elbow:  Feeling about the same Really both the shoulder and elbow at this point. No numbness or tingling.  Patient Initially saw with left-sided elbow pain about 6 months ago, and then she ultimately did not followup until about 6 weeks ago. She has had continued pain since my initial evaluation. She also has a mechanical clicking and popping in her elbow. Significant pain in and around the elbow, but not concentrated at either epicondyles. Recently she had an MRI of her elbow to evaluate this, and she did have some T2 signal at her distal biceps insertion, but aside from this and some osteoarthritic changes, it was a grossly normal MRI.  LEFT lateral shoulder pain and pain with abduction as well as reaching across her body continues. I did subacromial Depo-Medrol injection on last office visit, and have the patient start physical therapy, doing some rotator cuff strengthening and scapular stabilization. She has made no improvement at all and continues to have significant pain that is limiting her ability to function and use her LEFT shoulder also. She denies numbness or tingling.  01/09/2012 OV Several months of left upper lateral shoulder pain. Pain with abduction and some restriction of motion, particularly with internal and external range of motion. She denies any neck pain. She denies any shoulder blade pain. Now having pain in her upper arm and shoulder. Painful and pain with grabbing and having a burning sensation in her shoulder. Feels and has pain and cracking and really sore. Cannot sleep on her left side. Cannot put hand under pillow.   Left elbow is still bothering her considerably with rotational  movements, pronation and supination. She is having some mechanical clicking. I obtained a elbow series in May, 2012, and it was grossly normal. There is no loose bodies and no occult fracture. She has been doing some tennis elbow rehabilitation, and a different provider at another practice get a tennis elbow injection previously for her.  Past Medical History, Surgical History, Social History, Family History, Problem List, Medications, and Allergies have been reviewed and updated if relevant.  Review of Systems:  GEN: No fevers, chills. Nontoxic. Primarily MSK c/o today. MSK: Detailed in the HPI GI: tolerating PO intake without difficulty Neuro: No numbness, parasthesias, or tingling associated. Otherwise the pertinent positives of the ROS are noted above.    Physical Examination: Filed Vitals:   02/24/12 0923  BP: 120/72  Pulse: 100  Temp: 98.6 F (37 C)  TempSrc: Oral  Height: 5\' 8"  (1.727 m)  Weight: 211 lb 12.8 oz (96.072 kg)  SpO2: 99%    Body mass index is 32.20 kg/(m^2).   GEN: Well-developed,well-nourished,in no acute distress; alert,appropriate and cooperative throughout examination HEENT: Normocephalic and atraumatic without obvious abnormalities. Ears, externally no deformities PULM: Breathing comfortably in no respiratory distress EXT: No clubbing, cyanosis, or edema PSYCH: Normally interactive. Cooperative during the interview. Pleasant. Friendly and conversant. Not anxious or depressed appearing. Normal, full affect.  Shoulder: L Inspection: No muscle wasting or winging Ecchymosis/edema: neg  AC joint, scapula, clavicle: NT Cervical spine: NT, full ROM Spurling's: neg Abduction: full, 4+/5 - painful arc of motion Flexion: full, 5/5 IR, full, lift-off: 5/5 ER at neutral: full, 5/5 AC crossover: pos Neer: pos Hawkins:  pos Drop Test: neg Empty Can: pos Supraspinatus insertion: mild-mod T Bicipital groove: NT Speed's: pos Yergason's: neg Sulcus sign:  neg Scapular dyskinesis: none C5-T1 intact  Neuro: Sensation intact Grip 5/5  LEFT elbow with some continued mechanical clicking and popping with pronation and supination and with some terminal flexion and extension at the elbow. Strength at the biceps and triceps is preserved as well as flexion and extension at the wrist. Nontender at the medial and lateral upper condyle. Negative Tinel's at the elbow.  Assessment and Plan:  1. Left elbow pain  Ambulatory referral to Orthopedic Surgery  2. Left shoulder pain  Ambulatory referral to Orthopedic Surgery  3. Rotator cuff impingement syndrome  Ambulatory referral to Orthopedic Surgery  4. AC joint pain  Ambulatory referral to Orthopedic Surgery    Orders Today: Orders Placed This Encounter  Procedures  . Ambulatory referral to Orthopedic Surgery    Referral Priority:  Routine    Referral Type:  Surgical    Referral Reason:  Specialty Services Required    Requested Specialty:  Orthopedic Surgery    Number of Visits Requested:  1    Medications Today: No orders of the defined types were placed in this encounter.     The patient has not done well from a conservative standpoint. I still have concerns that the patient has mechanical symptoms at her elbow despite her MRI and worry that she may have a loose body in her elbow. I'm going to consult [facility] for their opinion about this case.   She certainly has some significant shoulder pain, impingement and a C. Joint inflammation. This may have occurred initially due to some secondary mechanical changes from her initial elbow pain. Failure with initial conservative measures.  Appreciate consult.

## 2012-03-04 ENCOUNTER — Other Ambulatory Visit: Payer: Self-pay | Admitting: Orthopedic Surgery

## 2012-03-04 DIAGNOSIS — M25519 Pain in unspecified shoulder: Secondary | ICD-10-CM

## 2012-03-06 ENCOUNTER — Other Ambulatory Visit: Payer: Self-pay | Admitting: Family Medicine

## 2012-03-07 ENCOUNTER — Ambulatory Visit
Admission: RE | Admit: 2012-03-07 | Discharge: 2012-03-07 | Disposition: A | Discharge status: Home / Self Care | Payer: 59 | Source: Ambulatory Visit | Attending: Orthopedic Surgery | Admitting: Orthopedic Surgery

## 2012-03-07 DIAGNOSIS — M25519 Pain in unspecified shoulder: Secondary | ICD-10-CM

## 2012-03-09 ENCOUNTER — Encounter: Payer: Self-pay | Admitting: Family Medicine

## 2012-04-02 ENCOUNTER — Ambulatory Visit (INDEPENDENT_AMBULATORY_CARE_PROVIDER_SITE_OTHER): Payer: 59 | Admitting: *Deleted

## 2012-04-02 DIAGNOSIS — E538 Deficiency of other specified B group vitamins: Secondary | ICD-10-CM

## 2012-04-02 MED ORDER — CYANOCOBALAMIN 1000 MCG/ML IJ SOLN
1000.0000 ug | Freq: Once | INTRAMUSCULAR | Status: AC
  Administered 2012-04-02: 1000 ug via INTRAMUSCULAR

## 2012-05-03 ENCOUNTER — Other Ambulatory Visit: Payer: Self-pay | Admitting: Family Medicine

## 2012-05-05 NOTE — Telephone Encounter (Signed)
Will refill electronically  

## 2012-05-11 ENCOUNTER — Ambulatory Visit (INDEPENDENT_AMBULATORY_CARE_PROVIDER_SITE_OTHER): Payer: 59 | Admitting: Family Medicine

## 2012-05-11 ENCOUNTER — Encounter: Payer: Self-pay | Admitting: Family Medicine

## 2012-05-11 ENCOUNTER — Ambulatory Visit (INDEPENDENT_AMBULATORY_CARE_PROVIDER_SITE_OTHER)
Admission: RE | Admit: 2012-05-11 | Discharge: 2012-05-11 | Disposition: A | Discharge status: Home / Self Care | Payer: 59 | Source: Ambulatory Visit | Attending: Family Medicine | Admitting: Family Medicine

## 2012-05-11 ENCOUNTER — Telehealth: Payer: Self-pay | Admitting: Family Medicine

## 2012-05-11 VITALS — BP 102/70 | HR 72 | Temp 98.1°F | Ht 66.75 in | Wt 208.5 lb

## 2012-05-11 DIAGNOSIS — M545 Low back pain, unspecified: Secondary | ICD-10-CM

## 2012-05-11 DIAGNOSIS — E538 Deficiency of other specified B group vitamins: Secondary | ICD-10-CM

## 2012-05-11 MED ORDER — DICLOFENAC SODIUM 75 MG PO TBEC: 75.0000 mg | DELAYED_RELEASE_TABLET | Freq: Two times a day (BID) | ORAL | Status: DC

## 2012-05-11 MED ORDER — TRAMADOL HCL 50 MG PO TABS: 50.0000 mg | ORAL_TABLET | Freq: Four times a day (QID) | ORAL | Status: DC | PRN

## 2012-05-11 MED ORDER — CYANOCOBALAMIN 1000 MCG/ML IJ SOLN
1000.0000 ug | Freq: Once | INTRAMUSCULAR | Status: AC
  Administered 2012-05-11: 1000 ug via INTRAMUSCULAR

## 2012-05-11 NOTE — Progress Notes (Signed)
Subjective:    Patient ID: Pamela Anthony, female    DOB: [birth year], 56 y.o.   MRN: [id number]  HPI Is her with R leg pain  Last week - buttock on R started to hurt and pain radiated to her calf / whole side of leg Took mobic Also tramadol left over from shoulder surgery  Now buttock is still sore  Leg still hurts  Is like a constant cramp/ sharp pain -- calf and foot  If she moves wrong or rolls over - more severe sharp pain down lateral leg Just a little numb on side of her leg    Back in April- had shoulder surgery - is still doing PT for that and getting better  Patient Active Problem List  Diagnoses  . POLYP, COLON  . B12 DEFICIENCY  . VITAMIN D DEFICIENCY  . HYPERLIPIDEMIA  . ANEMIA-IRON DEFICIENCY  . STRESS REACTION, ACUTE, WITH EMOTIONAL DISTURBANCE  . ALLERGIC RHINITIS  . GERD  . HIATAL HERNIA  . IRRITABLE BOWEL SYNDROME  . ARTHRALGIA  . NECK PAIN  . LOW BACK PAIN  . SLEEP APNEA  . FATIGUE  . URINARY INCONTINENCE  . MIGRAINES, HX OF  . Routine general medical examination at a health care facility  . Other screening mammogram  . Left elbow pain  . Facial numbness  . Low back pain radiating to right leg   Past Medical History  Diagnosis Date  . Allergic rhinitis   . Iron deficiency anemia   . GERD (gastroesophageal reflux disease)     EGD 10/2004  . Hyperlipemia   . Low back pain   . Urinary incontinence   . Obesity   . B12 deficiency   . Vitamin d deficiency     mild  . Degenerative disc disease     back  . Fungal infection 2005    lamasil   . Hemorrhoids, internal     colonoscopy 02/2008   Past Surgical History  Procedure Date  . Cesarean section 1985  . Nasal sinus surgery 2000's    right maxillary  . Tonsillectomy and adenoidectomy   . Knee surgery 1970's    right  . Hand surgery 2000    nodule removed  . Dilation and curettage of uterus 2005   History  Substance Use Topics  . Smoking status: Never Smoker   . Smokeless tobacco:  Never Used  . Alcohol Use: Yes     occasional   Family History  Problem Relation Age of Onset  . Coronary artery disease Father     in his 57's  . Arthritis Father   . Stroke Father   . Heart disease Father     pacemaker   Allergies  Allergen Reactions  . Atorvastatin     REACTION: myalgias  . Oxybutynin     REACTION: rash   Current Outpatient Prescriptions on File Prior to Visit  Medication Sig Dispense Refill  . Calcium Carbonate-Vitamin D (CALTRATE 600+D) 600-400 MG-UNIT per tablet Take 1 tablet by mouth daily.        . cholecalciferol (VITAMIN D) 1000 UNITS tablet Takes 4 tablets daily       . cyanocobalamin (,VITAMIN B-12,) 1000 MCG/ML injection One injection every 6 weeks.       Marland Kitchen FLUoxetine (PROZAC) 20 MG tablet Take 0.5-1 tablets (10-20 mg total) by mouth daily.  30 tablet  11  . hydrochlorothiazide (HYDRODIURIL) 25 MG tablet TAKE ONE-HALF TO ONE TABLET BY MOUTH EVERY DAY AS NEEDED  30 tablet  11  . Omega-3 Fatty Acids (FISH OIL) 1200 MG CAPS Take 1 capsule by mouth daily.        Marland Kitchen omeprazole (PRILOSEC) 20 MG capsule TAKE 1 CAPSULE BY MOUTH ONCE DAILY  30 capsule  6  . topiramate (TOPAMAX) 100 MG tablet take 1 tablet by mouth once daily  30 tablet  5  . VESICARE 10 MG tablet TAKE 1 TABLET BY MOUTH ONCE DAILY  30 tablet  6  . cyclobenzaprine (FLEXERIL) 10 MG tablet Take 10 mg by mouth 3 (three) times daily as needed. For muscle spasm and pain            Review of Systems Review of Systems  Constitutional: Negative for fever, appetite change, fatigue and unexpected weight change.  Eyes: Negative for pain and visual disturbance.  Respiratory: Negative for cough and shortness of breath.   Cardiovascular: Negative for cp or palpitations    Gastrointestinal: Negative for nausea, diarrhea and constipation.  Genitourinary: Negative for urgency and frequency.  Skin: Negative for pallor or rash   MSK pos for back / buttock and leg pain, shoulder soreness (improved) and neg  for swollen joints  Neurological: Negative for weakness, light-headedness, numbness and headaches.  Hematological: Negative for adenopathy. Does not bruise/bleed easily.  Psychiatric/Behavioral: Negative for dysphoric mood. The patient is not nervous/anxious.         Objective:   Physical Exam  Constitutional: She appears well-nourished. No distress.       overwt and well appearing   HENT:  Head: Normocephalic and atraumatic.  Eyes: Conjunctivae and EOM are normal. Pupils are equal, round, and reactive to light.  Neck: Normal range of motion. Neck supple.  Cardiovascular: Normal rate and regular rhythm.   Pulmonary/Chest: Effort normal and breath sounds normal.  Abdominal: Soft. Bowel sounds are normal.       No suprapubic tenderness    Musculoskeletal: She exhibits tenderness. She exhibits no edema.       Tender over R buttock/ perilumbar musculature  No bony tenderness SLR on R yields lateral thigh pain  Nl rom hip  Some trochanteric tenderness Nl flex and ext of LS   Lymphadenopathy:    She has no cervical adenopathy.  Neurological: She is alert. She has normal strength and normal reflexes. She displays no atrophy. No sensory deficit. She exhibits normal muscle tone. Coordination normal.  Skin: Skin is warm and dry. No rash noted. No erythema.  Psychiatric: She has a normal mood and affect.          Assessment & Plan:

## 2012-05-11 NOTE — Patient Instructions (Signed)
Alternate ice and heat on back / buttock area  Use the voltaren with food  Tramadol as needed  Xray today and then we will make a plan

## 2012-05-11 NOTE — Telephone Encounter (Signed)
Caller: Tannia/Patient; PCP: Roxy Manns A.; CB#: [phone]; Call regarding Right Leg Cramping in thigh area-  onset 04/30/12 and numbness in Calf -onset 05/09/12; She gets shooting pain if walking in flats or when she is laying down and needs to change positions. She has tried taking Meloxicam and Tramadol for the pain and not helping. No areas of redness or warmth or localized tenderness. She can walk and drive. Triage and Care advice per Numbness Protocol and appnt scheduled for today 05/11/12 @ 1530 with Dr. Milinda Antis for "New or worsening change in sensation in extremities And no other Sx".

## 2012-05-11 NOTE — Telephone Encounter (Signed)
I will see her then  

## 2012-05-12 ENCOUNTER — Telehealth: Payer: Self-pay | Admitting: Family Medicine

## 2012-05-12 DIAGNOSIS — M545 Low back pain: Secondary | ICD-10-CM

## 2012-05-12 NOTE — Telephone Encounter (Signed)
Message copied by [name] on Tue May 12, 2012  5:07 PM ------      Message from: Pamela Anthony      Created: Tue May 12, 2012  2:13 PM       Spoke with patient and she doesn't have anymore physical therapy sessions left on her insurance until July 1st, so it would have to be after then. Please advise

## 2012-05-12 NOTE — Telephone Encounter (Signed)
Ok- lets go forward with orthopedic consult instead I will do ref

## 2012-05-13 NOTE — Telephone Encounter (Signed)
Patient notified as instructed by telephone. Advised patient that [facility] will be in touch with her to get this scheduled. 

## 2012-05-14 NOTE — Assessment & Plan Note (Signed)
Due for injection today  Feeling good - and therapy helps

## 2012-05-14 NOTE — Assessment & Plan Note (Signed)
With radicular symptoms to R leg LS xray shows mild disc deg changes  Given pt opt of PT vs ortho consult - she has met her PT visits already with shoulder so chose ortho ref  Will use flexeril/ voltaren prn with ice and heat

## 2012-05-14 NOTE — Assessment & Plan Note (Signed)
See assessment for back pain - deg change on xray, suspect radiculopathy consv management Cannot afford PT Will ref to ortho- ? If worse may need MRI Will use flexeril/ ice / heat/ voltaren for now and update

## 2012-05-15 ENCOUNTER — Ambulatory Visit: Payer: 59

## 2012-06-25 ENCOUNTER — Ambulatory Visit (INDEPENDENT_AMBULATORY_CARE_PROVIDER_SITE_OTHER): Payer: BC Managed Care – PPO

## 2012-06-25 DIAGNOSIS — E538 Deficiency of other specified B group vitamins: Secondary | ICD-10-CM

## 2012-06-25 MED ORDER — CYANOCOBALAMIN 1000 MCG/ML IJ SOLN
1000.0000 ug | Freq: Once | INTRAMUSCULAR | Status: AC
  Administered 2012-06-25: 1000 ug via INTRAMUSCULAR

## 2012-07-18 ENCOUNTER — Other Ambulatory Visit: Payer: Self-pay | Admitting: Family Medicine

## 2012-07-20 ENCOUNTER — Other Ambulatory Visit: Payer: Self-pay

## 2012-07-20 MED ORDER — SOLIFENACIN SUCCINATE 10 MG PO TABS: 10.0000 mg | ORAL_TABLET | Freq: Every day | ORAL | Status: DC

## 2012-07-20 MED ORDER — OMEPRAZOLE 20 MG PO CPDR: 20.0000 mg | DELAYED_RELEASE_CAPSULE | Freq: Every day | ORAL | Status: DC

## 2012-08-06 ENCOUNTER — Telehealth: Payer: Self-pay | Admitting: Family Medicine

## 2012-08-06 ENCOUNTER — Ambulatory Visit (INDEPENDENT_AMBULATORY_CARE_PROVIDER_SITE_OTHER): Payer: BC Managed Care – PPO

## 2012-08-06 DIAGNOSIS — E559 Vitamin D deficiency, unspecified: Secondary | ICD-10-CM

## 2012-08-06 DIAGNOSIS — Z0000 Encounter for general adult medical examination without abnormal findings: Secondary | ICD-10-CM

## 2012-08-06 DIAGNOSIS — E538 Deficiency of other specified B group vitamins: Secondary | ICD-10-CM

## 2012-08-06 DIAGNOSIS — E785 Hyperlipidemia, unspecified: Secondary | ICD-10-CM

## 2012-08-06 MED ORDER — CYANOCOBALAMIN 1000 MCG/ML IJ SOLN
1000.0000 ug | Freq: Once | INTRAMUSCULAR | Status: AC
  Administered 2012-08-06: 1000 ug via INTRAMUSCULAR

## 2012-08-06 NOTE — Telephone Encounter (Signed)
Message copied by [name] on Thu Aug 06, 2012  7:50 PM ------      Message from: Pamela Anthony      Created: Fri Jul 31, 2012 12:19 PM      Regarding: cpx labs Fri 8/30       Please order  future cpx labs for pt's upcomming lab appt.      Thanks      Rodney Booze

## 2012-08-07 ENCOUNTER — Other Ambulatory Visit (INDEPENDENT_AMBULATORY_CARE_PROVIDER_SITE_OTHER): Payer: BC Managed Care – PPO

## 2012-08-07 DIAGNOSIS — E538 Deficiency of other specified B group vitamins: Secondary | ICD-10-CM

## 2012-08-07 DIAGNOSIS — Z0000 Encounter for general adult medical examination without abnormal findings: Secondary | ICD-10-CM

## 2012-08-07 DIAGNOSIS — E559 Vitamin D deficiency, unspecified: Secondary | ICD-10-CM

## 2012-08-07 DIAGNOSIS — E785 Hyperlipidemia, unspecified: Secondary | ICD-10-CM

## 2012-08-07 LAB — COMPREHENSIVE METABOLIC PANEL
ALT: 22 U/L (ref 0–35)
Albumin: 3.8 g/dL (ref 3.5–5.2)
CO2: 26 mEq/L (ref 19–32)
Calcium: 8.9 mg/dL (ref 8.4–10.5)
Chloride: 108 mEq/L (ref 96–112)
GFR: 79.02 mL/min (ref >60.00)
Potassium: 3.8 mEq/L (ref 3.5–5.1)
Sodium: 142 mEq/L (ref 135–145)
Total Protein: 6.7 g/dL (ref 6.0–8.3)

## 2012-08-07 LAB — LIPID PANEL
Cholesterol: 252 mg/dL — ABNORMAL HIGH (ref 0–200)
HDL: 43.2 mg/dL (ref >39.00)
VLDL: 35.6 mg/dL (ref 0.0–40.0)

## 2012-08-07 LAB — CBC WITH DIFFERENTIAL/PLATELET
Basophils Absolute: 0 10*3/uL (ref 0.0–0.1)
Eosinophils Absolute: 0.1 10*3/uL (ref 0.0–0.7)
Lymphocytes Relative: 31.1 % (ref 12.0–46.0)
Monocytes Relative: 7.3 % (ref 3.0–12.0)
Platelets: 223 10*3/uL (ref 150.0–400.0)
RDW: 13.7 % (ref 11.5–14.6)

## 2012-08-08 LAB — VITAMIN D 25 HYDROXY (VIT D DEFICIENCY, FRACTURES): Vit D, 25-Hydroxy: 36 ng/mL (ref 30–89)

## 2012-08-14 ENCOUNTER — Encounter: Payer: Self-pay | Admitting: Family Medicine

## 2012-08-14 ENCOUNTER — Ambulatory Visit (INDEPENDENT_AMBULATORY_CARE_PROVIDER_SITE_OTHER): Payer: BC Managed Care – PPO | Admitting: Family Medicine

## 2012-08-14 ENCOUNTER — Other Ambulatory Visit: Payer: Self-pay | Admitting: *Deleted

## 2012-08-14 VITALS — BP 120/80 | HR 89 | Temp 97.9°F | Ht 67.0 in | Wt 215.2 lb

## 2012-08-14 DIAGNOSIS — E559 Vitamin D deficiency, unspecified: Secondary | ICD-10-CM

## 2012-08-14 DIAGNOSIS — Z780 Asymptomatic menopausal state: Secondary | ICD-10-CM

## 2012-08-14 DIAGNOSIS — Z0000 Encounter for general adult medical examination without abnormal findings: Secondary | ICD-10-CM

## 2012-08-14 DIAGNOSIS — E538 Deficiency of other specified B group vitamins: Secondary | ICD-10-CM

## 2012-08-14 DIAGNOSIS — Z1231 Encounter for screening mammogram for malignant neoplasm of breast: Secondary | ICD-10-CM

## 2012-08-14 DIAGNOSIS — E785 Hyperlipidemia, unspecified: Secondary | ICD-10-CM

## 2012-08-14 MED ORDER — EZETIMIBE-SIMVASTATIN 10-20 MG PO TABS: 1.0000 | ORAL_TABLET | Freq: Every day | ORAL | Status: DC

## 2012-08-14 MED ORDER — OMEPRAZOLE 20 MG PO CPDR: 20.0000 mg | DELAYED_RELEASE_CAPSULE | Freq: Every day | ORAL | Status: DC

## 2012-08-14 MED ORDER — FLUOXETINE HCL 20 MG PO TABS: 10.0000 mg | ORAL_TABLET | Freq: Every day | ORAL | Status: DC

## 2012-08-14 MED ORDER — SOLIFENACIN SUCCINATE 10 MG PO TABS: 10.0000 mg | ORAL_TABLET | Freq: Every day | ORAL | Status: DC

## 2012-08-14 NOTE — Patient Instructions (Addendum)
Don't forget to schedule your own mammogram We will refer you for dexa at check out  Start back on vytorin  Schedule fasting labs for 6 weeks Avoid red meat/ fried foods/ egg yolks/ fatty breakfast meats/ butter, cheese and high fat dairy/ and shellfish

## 2012-08-14 NOTE — Progress Notes (Signed)
Subjective:    Patient ID: Pamela Anthony, female    DOB: [birth year], 56 y.o.   MRN: [id number]  HPI Here for health maintenance exam and to review chronic medical problems    Had shoulder surgery in April Then LS pain -has had epidural inj , and PT and also gabapentin Now shoulder hurts again  Feels like a toothache -just a few days   bp is 135/90 A little high for her  2nd check is fine   Wt is up 7 lb -not as active with her injuries Also on gabapentin   Vit D level is 36 up from 30- on 4000 iu daily of D3 B12 level is very good - with injections   Lipids Lab Results  Component Value Date   CHOL 252* 08/07/2012   CHOL 204* 04/23/2011   CHOL 150 05/26/2009   Lab Results  Component Value Date   HDL 43.20 08/07/2012   HDL 02.72 04/23/2011   HDL 53.66 05/26/2009   Lab Results  Component Value Date   LDLCALC 75 05/26/2009   LDLCALC 134* 03/08/2009   LDLCALC 92 09/05/2008   Lab Results  Component Value Date   TRIG 178.0* 08/07/2012   TRIG 120.0 04/23/2011   TRIG 80.0 05/26/2009   Lab Results  Component Value Date   CHOLHDL 6 08/07/2012   CHOLHDL 5 04/23/2011   CHOLHDL 3 05/26/2009   Lab Results  Component Value Date   LDLDIRECT 183.6 08/07/2012   LDLDIRECT 170.8 04/23/2011    Is eating healthy low fat diet  vytorin worked the best for her     Chemistry      Component Value Date/Time   NA 142 08/07/2012 0826   K 3.8 08/07/2012 0826   CL 108 08/07/2012 0826   CO2 26 08/07/2012 0826   BUN 15 08/07/2012 0826   CREATININE 0.8 08/07/2012 0826      Component Value Date/Time   CALCIUM 8.9 08/07/2012 0826   ALKPHOS 59 08/07/2012 0826   AST 20 08/07/2012 0826   ALT 22 08/07/2012 0826   BILITOT 0.7 08/07/2012 0826      Lab Results  Component Value Date   WBC 3.4* 08/07/2012   HGB 12.2 08/07/2012   HCT 36.9 08/07/2012   MCV 89.5 08/07/2012   PLT 223.0 08/07/2012    Lab Results  Component Value Date   TSH 2.38 08/07/2012     Flu shot Will get it this fall   Pap 5/11 No  problems gyn at all  Is done with her periods - as of June 2012 Is doing ok with menopause  Is interested in dexa   [place]ammo 6/12- [place] She will make her own appt  Self exam -- no lumps or changes   3/09 colonosc- with 10 year f/u  Patient Active Problem List  Diagnosis  . POLYP, COLON  . B12 DEFICIENCY  . VITAMIN D DEFICIENCY  . HYPERLIPIDEMIA  . ANEMIA-IRON DEFICIENCY  . STRESS REACTION, ACUTE, WITH EMOTIONAL DISTURBANCE  . ALLERGIC RHINITIS  . GERD  . HIATAL HERNIA  . IRRITABLE BOWEL SYNDROME  . ARTHRALGIA  . NECK PAIN  . LOW BACK PAIN  . SLEEP APNEA  . FATIGUE  . URINARY INCONTINENCE  . MIGRAINES, HX OF  . Routine general medical examination at a health care facility  . Other screening mammogram  . Left elbow pain  . Facial numbness  . Low back pain radiating to right leg   Past Medical History  Diagnosis Date  .  Allergic rhinitis   . Iron deficiency anemia   . GERD (gastroesophageal reflux disease)     EGD 10/2004  . Hyperlipemia   . Low back pain   . Urinary incontinence   . Obesity   . B12 deficiency   . Vitamin d deficiency     mild  . Degenerative disc disease     back  . Fungal infection 2005    lamasil   . Hemorrhoids, internal     colonoscopy 02/2008   Past Surgical History  Procedure Date  . Cesarean section 1985  . Nasal sinus surgery 2000's    right maxillary  . Tonsillectomy and adenoidectomy   . Knee surgery 1970's    right  . Hand surgery 2000    nodule removed  . Dilation and curettage of uterus 2005   History  Substance Use Topics  . Smoking status: Never Smoker   . Smokeless tobacco: Never Used  . Alcohol Use: Yes     occasional   Family History  Problem Relation Age of Onset  . Coronary artery disease Father     in his 44's  . Arthritis Father   . Stroke Father   . Heart disease Father     pacemaker   Allergies  Allergen Reactions  . Atorvastatin     REACTION: myalgias  . Oxybutynin     REACTION: rash    Current Outpatient Prescriptions on File Prior to Visit  Medication Sig Dispense Refill  . Calcium Carbonate-Vitamin D (CALTRATE 600+D) 600-400 MG-UNIT per tablet Take 1 tablet by mouth daily.        . cholecalciferol (VITAMIN D) 1000 UNITS tablet Takes 4 tablets daily       . cyanocobalamin (,VITAMIN B-12,) 1000 MCG/ML injection One injection every 6 weeks.       . cyclobenzaprine (FLEXERIL) 10 MG tablet Take 10 mg by mouth 3 (three) times daily as needed. For muscle spasm and pain       . diclofenac (VOLTAREN) 75 MG EC tablet Take 1 tablet (75 mg total) by mouth 2 (two) times daily. With a meal  60 tablet  3  . eletriptan (RELPAX) 40 MG tablet One tablet by mouth at onset of headache. May repeat in 2 hours if headache persists or recurs. may repeat in 2 hours if necessary      . FLUoxetine (PROZAC) 20 MG tablet Take 0.5-1 tablets (10-20 mg total) by mouth daily.  30 tablet  11  . gabapentin (NEURONTIN) 300 MG capsule Take 300 mg by mouth 3 (three) times daily.      . hydrochlorothiazide (HYDRODIURIL) 25 MG tablet TAKE ONE-HALF TO ONE TABLET BY MOUTH EVERY DAY AS NEEDED  30 tablet  11  . Omega-3 Fatty Acids (FISH OIL) 1200 MG CAPS Take 1 capsule by mouth daily.        Marland Kitchen omeprazole (PRILOSEC) 20 MG capsule Take 1 capsule (20 mg total) by mouth daily.  30 capsule  1  . solifenacin (VESICARE) 10 MG tablet Take 1 tablet (10 mg total) by mouth daily.  30 tablet  1  . topiramate (TOPAMAX) 100 MG tablet take 1 tablet by mouth once daily  30 tablet  5  . traMADol (ULTRAM) 50 MG tablet Take 1 tablet (50 mg total) by mouth every 6 (six) hours as needed.  30 tablet  1      Review of Systems    Review of Systems  Constitutional: Negative for fever, appetite change, fatigue  and unexpected weight change.  Eyes: Negative for pain and visual disturbance.  Respiratory: Negative for cough and shortness of breath.   Cardiovascular: Negative for cp or palpitations    Gastrointestinal: Negative for nausea,  diarrhea and constipation.  Genitourinary: Negative for urgency and frequency.  Skin: Negative for pallor or rash   MSK pos for back pain and shoulder pain  Neurological: Negative for weakness, light-headedness, numbness and headaches.  Hematological: Negative for adenopathy. Does not bruise/bleed easily.  Psychiatric/Behavioral: Negative for dysphoric mood. The patient is not nervous/anxious.      Objective:   Physical Exam  Constitutional: She appears well-developed and well-nourished. No distress.  HENT:  Head: Normocephalic and atraumatic.  Right Ear: External ear normal.  Left Ear: External ear normal.  Nose: Nose normal.  Mouth/Throat: Oropharynx is clear and moist. No oropharyngeal exudate.  Eyes: Conjunctivae and EOM are normal. Pupils are equal, round, and reactive to light. Right eye exhibits no discharge. No scleral icterus.  Neck: Normal range of motion. Neck supple. No JVD present. Carotid bruit is not present. No thyromegaly present.  Cardiovascular: Normal rate, regular rhythm, normal heart sounds and intact distal pulses.  Exam reveals no gallop.   Pulmonary/Chest: Effort normal and breath sounds normal. No respiratory distress. She has no wheezes.  Abdominal: Soft. Bowel sounds are normal. She exhibits no distension, no abdominal bruit and no mass. There is no tenderness.  Genitourinary: No breast swelling, tenderness, discharge or bleeding.       Breast exam: No mass, nodules, thickening, tenderness, bulging, retraction, inflamation, nipple discharge or skin changes noted.  No axillary or clavicular LA.  Chaperoned exam.    Musculoskeletal: She exhibits no edema and no tenderness.  Lymphadenopathy:    She has no cervical adenopathy.  Neurological: She is alert. She has normal reflexes. No cranial nerve deficit. She exhibits normal muscle tone. Coordination normal.  Skin: Skin is warm and dry. No rash noted. No erythema. No pallor.  Psychiatric: She has a normal mood  and affect.          Assessment & Plan:

## 2012-08-14 NOTE — Assessment & Plan Note (Signed)
Reviewed health habits including diet and exercise and skin cancer prevention Also reviewed health mt list, fam hx and immunizations  Rev wellness lab in detail Will continue to work on healthy habits for wt loss

## 2012-08-14 NOTE — Assessment & Plan Note (Signed)
Nl breast exam Pt will schedule own mammo Enc monthly self exams

## 2012-08-14 NOTE — Telephone Encounter (Signed)
Can refill for the year

## 2012-08-14 NOTE — Assessment & Plan Note (Signed)
Lipids are very high  Diet is good To start back on vytorin 10/20  Lab 6 wk  Disc goals for lipids and reasons to control them Rev labs with pt Rev low sat fat diet in detail

## 2012-08-14 NOTE — Assessment & Plan Note (Signed)
This is improved with 4000 iu daily otc D3 Level 36 sched dexa (also post menop and on PPI)

## 2012-08-14 NOTE — Telephone Encounter (Signed)
Please give her a year of refils, thanks 

## 2012-08-14 NOTE — Assessment & Plan Note (Signed)
Very well controlled on 6 wk injections Is on PPI Balanced diet Rev lab with pt

## 2012-08-14 NOTE — Assessment & Plan Note (Signed)
Among risk factors for OP - incl PPI and D def  sched dexa Doing well with menopause so far

## 2012-08-17 ENCOUNTER — Telehealth: Payer: Self-pay | Admitting: *Deleted

## 2012-08-17 NOTE — Telephone Encounter (Signed)
Prior Berkley Harvey is needed for vytorin, form is on your shelf.

## 2012-08-17 NOTE — Telephone Encounter (Signed)
Done and in IN box, thanks  

## 2012-08-18 ENCOUNTER — Other Ambulatory Visit: Payer: BC Managed Care – PPO

## 2012-08-18 NOTE — Telephone Encounter (Signed)
Form faxed to [facility] [phone]

## 2012-08-19 ENCOUNTER — Ambulatory Visit
Admission: RE | Admit: 2012-08-19 | Discharge: 2012-08-19 | Disposition: A | Discharge status: Home / Self Care | Payer: BC Managed Care – PPO | Source: Ambulatory Visit | Attending: Family Medicine | Admitting: Family Medicine

## 2012-08-19 ENCOUNTER — Ambulatory Visit: Payer: Self-pay | Admitting: Family Medicine

## 2012-08-19 DIAGNOSIS — E559 Vitamin D deficiency, unspecified: Secondary | ICD-10-CM

## 2012-08-19 DIAGNOSIS — Z780 Asymptomatic menopausal state: Secondary | ICD-10-CM

## 2012-08-19 NOTE — Telephone Encounter (Signed)
Prior auth given for vytorin, approval letter placed on doctor's desk for signature and scanning.

## 2012-08-20 ENCOUNTER — Encounter: Payer: Self-pay | Admitting: Family Medicine

## 2012-08-25 ENCOUNTER — Encounter: Payer: Self-pay | Admitting: Family Medicine

## 2012-08-25 ENCOUNTER — Encounter: Payer: Self-pay | Admitting: *Deleted

## 2012-09-17 ENCOUNTER — Ambulatory Visit (INDEPENDENT_AMBULATORY_CARE_PROVIDER_SITE_OTHER): Payer: No Typology Code available for payment source

## 2012-09-17 DIAGNOSIS — E538 Deficiency of other specified B group vitamins: Secondary | ICD-10-CM

## 2012-09-17 MED ORDER — CYANOCOBALAMIN 1000 MCG/ML IJ SOLN
1000.0000 ug | Freq: Once | INTRAMUSCULAR | Status: AC
  Administered 2012-09-17: 1000 ug via INTRAMUSCULAR

## 2012-09-25 ENCOUNTER — Other Ambulatory Visit (INDEPENDENT_AMBULATORY_CARE_PROVIDER_SITE_OTHER): Payer: BC Managed Care – PPO

## 2012-09-25 DIAGNOSIS — E785 Hyperlipidemia, unspecified: Secondary | ICD-10-CM

## 2012-09-25 LAB — LIPID PANEL
Cholesterol: 153 mg/dL (ref 0–200)
Triglycerides: 118 mg/dL (ref 0.0–149.0)

## 2012-09-28 ENCOUNTER — Encounter: Payer: Self-pay | Admitting: *Deleted

## 2012-10-29 ENCOUNTER — Ambulatory Visit (INDEPENDENT_AMBULATORY_CARE_PROVIDER_SITE_OTHER): Payer: BC Managed Care – PPO | Admitting: *Deleted

## 2012-10-29 DIAGNOSIS — E538 Deficiency of other specified B group vitamins: Secondary | ICD-10-CM

## 2012-10-29 MED ORDER — CYANOCOBALAMIN 1000 MCG/ML IJ SOLN
1000.0000 ug | Freq: Once | INTRAMUSCULAR | Status: AC
  Administered 2012-10-29: 1000 ug via INTRAMUSCULAR

## 2012-11-03 ENCOUNTER — Other Ambulatory Visit: Payer: Self-pay | Admitting: Family Medicine

## 2012-12-10 ENCOUNTER — Ambulatory Visit (INDEPENDENT_AMBULATORY_CARE_PROVIDER_SITE_OTHER): Payer: No Typology Code available for payment source | Admitting: *Deleted

## 2012-12-10 DIAGNOSIS — E538 Deficiency of other specified B group vitamins: Secondary | ICD-10-CM

## 2012-12-10 MED ORDER — CYANOCOBALAMIN 1000 MCG/ML IJ SOLN
1000.0000 ug | Freq: Once | INTRAMUSCULAR | Status: AC
  Administered 2012-12-10: 1000 ug via INTRAMUSCULAR

## 2012-12-29 ENCOUNTER — Telehealth: Payer: Self-pay | Admitting: Family Medicine

## 2012-12-29 MED ORDER — ELETRIPTAN HYDROBROMIDE 40 MG PO TABS: 40.0000 mg | ORAL_TABLET | ORAL | Status: DC | PRN

## 2012-12-29 NOTE — Telephone Encounter (Signed)
Patient Information:  Caller Name: Cassiopeia  Phone: [phone]  Patient: Pamela Anthony, Pamela Anthony  Gender: Female  DOB: 11/12/1956  Age: 57 Years  PCP: Roxy Manns [facility])  Pregnant: No  Office Follow Up:  Does the office need to follow up with this patient?: Yes  Instructions For The Office: Patient requests Relpax 40 mg prn Rx for her migraine headaches.  Former Rx from Dr. Neale Burly; has not seen him in a few years.   Symptoms  Reason For Call & Symptoms: Migraine 1/20 and 1/21.  Requests Relpax 40 mg tablets.  Relates she formerly got these from Dr. Neale Burly a few years ago.  Current pain rated at 3 of 10; had been up to 7 of 10.    Relates she had flashing lights in right eye only.  Pain on left side where her migraines normally occur.  Caller declined appointment.  She asked for Rx for Relpax.  Uses [facility] on [address]  [phone]  Reviewed Health History In EMR: Yes  Reviewed Medications In EMR: Yes  Reviewed Allergies In EMR: Yes  Reviewed Surgeries / Procedures: Yes  Date of Onset of Symptoms: 12/28/2012  Treatments Tried: Topimax  Treatments Tried Worked: No OB / GYN:  LMP: Unknown  Guideline(s) Used:  Headache  Disposition Per Guideline:   See Today or Tomorrow in Office  Reason For Disposition Reached:   Unexplained headache that is present > 24 hours  Advice Given:  Reassurance - Migraine Headache:  You have told me that this headache is similar to previous migraine headaches that you have had. If the pattern or severity of your headache changes, you will need to see your physician.  Migraine headaches are also called vascular headaches. A migraine can be anywhere from mild to severely painful. Sufferers often describe it as throbbing or pulsing. It is often just on one side. Associated symptoms include nausea and vomiting. Some individuals will have visual or other neurological warning symptoms (aura) that a migraine is coming.  Pain Medicines:  For pain  relief, you can take either acetaminophen, ibuprofen, or naproxen.  They are over-the-counter (OTC) pain drugs. You can buy them at the drugstore.  Migraine Medication:   If your doctor has prescribed specific medication for your migraine, take it as directed as soon as the migraine starts.  Rest:   Lie down in a dark, quiet place and try to relax. Close your eyes and imagine your entire body relaxing.  Rest:   Lie down in a dark, quiet place and try to relax. Close your eyes and imagine your entire body relaxing.  Apply Cold to the Area:   Apply a cold wet washcloth or cold pack to the forehead for 20 minutes.  Call Back If:  Headache lasts longer than 24 hours  You become worse.  Patient Refused Recommendation:  Patient Refused Care Advice  Requests Rx for migraine medication.

## 2012-12-29 NOTE — Telephone Encounter (Signed)
Pt notified Rx sent in.

## 2012-12-29 NOTE — Telephone Encounter (Signed)
Will refill electronically  

## 2013-01-18 ENCOUNTER — Encounter: Payer: Self-pay | Admitting: Family Medicine

## 2013-01-18 ENCOUNTER — Ambulatory Visit (INDEPENDENT_AMBULATORY_CARE_PROVIDER_SITE_OTHER): Payer: BC Managed Care – PPO | Admitting: Family Medicine

## 2013-01-18 VITALS — BP 104/68 | HR 86 | Temp 98.7°F | Ht 67.0 in | Wt 215.0 lb

## 2013-01-18 DIAGNOSIS — F341 Dysthymic disorder: Secondary | ICD-10-CM

## 2013-01-18 DIAGNOSIS — F418 Other specified anxiety disorders: Secondary | ICD-10-CM | POA: Insufficient documentation

## 2013-01-18 DIAGNOSIS — M542 Cervicalgia: Secondary | ICD-10-CM

## 2013-01-18 DIAGNOSIS — E538 Deficiency of other specified B group vitamins: Secondary | ICD-10-CM

## 2013-01-18 MED ORDER — CYANOCOBALAMIN 1000 MCG/ML IJ SOLN
1000.0000 ug | Freq: Once | INTRAMUSCULAR | Status: AC
  Administered 2013-01-18: 1000 ug via INTRAMUSCULAR

## 2013-01-18 MED ORDER — FLUOXETINE HCL 20 MG PO TABS: 40.0000 mg | ORAL_TABLET | Freq: Every day | ORAL | Status: DC

## 2013-01-18 MED ORDER — CYCLOBENZAPRINE HCL 10 MG PO TABS: 10.0000 mg | ORAL_TABLET | Freq: Every evening | ORAL | Status: DC | PRN

## 2013-01-18 NOTE — Assessment & Plan Note (Signed)
Injection today  Overall doing well with this

## 2013-01-18 NOTE — Patient Instructions (Addendum)
Try to take care of yourself - with healthy diet and exercise (consider yoga) Increase prozac to total of 40 mg daily- let me know if any problems  Follow up with me in about 3 months B12 shot today

## 2013-01-18 NOTE — Assessment & Plan Note (Signed)
Recurrent neck spasm - likely exacerbated by stress Disc regimen of heat/ better pillow/ flexeril qhs  Also consider stretching/ yoga

## 2013-01-18 NOTE — Progress Notes (Signed)
Subjective:    Patient ID: Pamela Anthony, female    DOB: 05-27-1956, 57 y.o.   MRN: [id number]  HPI Here for neck pain and also hormonal problems   Is very irritable and having trouble controlling her emotional  Is getting upset about issues she should not get upset about  Also tearful at times Frustrated generally  LMP was last July- is in menopause   Also pretty stressed as well  Loves her job but has to work in a very toxic situation- her boss is overly demanding  husb is on disability  Everything else is pretty stable  Does not take great care of herself due to exhuastion  Is eating healthy   Feels like depression is also playing a role   Does not want to see her counselor - she handles stress well/ good support and coping tech  Neck pain 1-2 weeks -pain in back of the neck- both sides of head (? Stress related)  More pain to turn to the R  Stiff to tilt head to the R as well  Fluffy pillow in general , she tried memory foam    Patient Active Problem List  Diagnosis  . POLYP, COLON  . B12 DEFICIENCY  . VITAMIN D DEFICIENCY  . HYPERLIPIDEMIA  . STRESS REACTION, ACUTE, WITH EMOTIONAL DISTURBANCE  . ALLERGIC RHINITIS  . GERD  . HIATAL HERNIA  . IRRITABLE BOWEL SYNDROME  . ARTHRALGIA  . NECK PAIN  . LOW BACK PAIN  . SLEEP APNEA  . FATIGUE  . URINARY INCONTINENCE  . MIGRAINES, HX OF  . Routine general medical examination at a health care facility  . Other screening mammogram  . Left elbow pain  . Facial numbness  . Low back pain radiating to right leg  . Post-menopausal   Past Medical History  Diagnosis Date  . Allergic rhinitis   . Iron deficiency anemia   . GERD (gastroesophageal reflux disease)     EGD 10/2004  . Hyperlipemia   . Low back pain   . Urinary incontinence   . Obesity   . B12 deficiency   . Vitamin D deficiency     mild  . Degenerative disc disease     back  . Fungal infection 2005    lamasil   . Hemorrhoids, internal      colonoscopy 02/2008   Past Surgical History  Procedure Laterality Date  . Cesarean section  1985  . Nasal sinus surgery  2000's    right maxillary  . Tonsillectomy and adenoidectomy    . Knee surgery  1970's    right  . Hand surgery  2000    nodule removed  . Dilation and curettage of uterus  2005   History  Substance Use Topics  . Smoking status: Never Smoker   . Smokeless tobacco: Never Used  . Alcohol Use: Yes     Comment: occasional   Family History  Problem Relation Age of Onset  . Coronary artery disease Father     in his 62's  . Arthritis Father   . Stroke Father   . Heart disease Father     pacemaker   Allergies  Allergen Reactions  . Atorvastatin     REACTION: myalgias  . Oxybutynin     REACTION: rash   Current Outpatient Prescriptions on File Prior to Visit  Medication Sig Dispense Refill  . Calcium Carbonate-Vitamin D (CALTRATE 600+D) 600-400 MG-UNIT per tablet Take 1 tablet by mouth daily.        Marland Kitchen  cholecalciferol (VITAMIN D) 1000 UNITS tablet Takes 4 tablets daily       . cyanocobalamin (,VITAMIN B-12,) 1000 MCG/ML injection One injection every 6 weeks.       . cyclobenzaprine (FLEXERIL) 10 MG tablet Take 10 mg by mouth 3 (three) times daily as needed. For muscle spasm and pain       . eletriptan (RELPAX) 40 MG tablet One tablet by mouth at onset of headache. May repeat in 2 hours if headache persists or recurs. may repeat in 2 hours if necessary  10 tablet  1  . ezetimibe-simvastatin (VYTORIN) 10-20 MG per tablet Take 1 tablet by mouth at bedtime.  90 tablet  3  . hydrochlorothiazide (HYDRODIURIL) 25 MG tablet TAKE ONE-HALF TO ONE TABLET BY MOUTH EVERY DAY AS NEEDED  30 tablet  11  . Omega-3 Fatty Acids (FISH OIL) 1200 MG CAPS Take 1 capsule by mouth daily.        Marland Kitchen omeprazole (PRILOSEC) 20 MG capsule Take 1 capsule (20 mg total) by mouth daily.  90 capsule  3  . solifenacin (VESICARE) 10 MG tablet Take 1 tablet (10 mg total) by mouth daily.  90 tablet  3   . topiramate (TOPAMAX) 100 MG tablet TAKE ONE TABLET BY MOUTH EVERY DAY  30 tablet  5   No current facility-administered medications on file prior to visit.     Review of Systems Review of Systems  Constitutional: Negative for fever, appetite change,  and unexpected weight change. pos for fatigue Eyes: Negative for pain and visual disturbance.  Respiratory: Negative for cough and shortness of breath.   Cardiovascular: Negative for cp or palpitations    Gastrointestinal: Negative for nausea, diarrhea and constipation.  Genitourinary: Negative for urgency and frequency.  Skin: Negative for pallor or rash   MSK pos for neck pain , neg for acute joint swelling or pain  Neurological: Negative for weakness, light-headedness, numbness and headaches.  Hematological: Negative for adenopathy. Does not bruise/bleed easily.  Psychiatric/Behavioral: Negative for dysphoric mood. The patient is anxious with many stressors and is irritable        Objective:   Physical Exam  Constitutional: She appears well-developed and well-nourished. No distress.  HENT:  Head: Normocephalic and atraumatic.  Mouth/Throat: Oropharynx is clear and moist. No oropharyngeal exudate.  Eyes: Conjunctivae and EOM are normal. Pupils are equal, round, and reactive to light. Right eye exhibits no discharge. Left eye exhibits no discharge. No scleral icterus.  Neck: Normal range of motion. Neck supple. No JVD present. No thyromegaly present.  Cardiovascular: Normal rate, regular rhythm, normal heart sounds and intact distal pulses.  Exam reveals no gallop.   Pulmonary/Chest: Effort normal and breath sounds normal. No respiratory distress. She has no wheezes. She exhibits no tenderness.  Abdominal: Soft. Bowel sounds are normal. She exhibits no distension. There is no tenderness.  Musculoskeletal: She exhibits tenderness. She exhibits no edema.  Tender para cervical musculature and trapezius bilaterally No bony  tenderness Nl rom neck Pain to fully rotate L   Lymphadenopathy:    She has no cervical adenopathy.  Neurological: She is alert. She has normal reflexes. She displays no tremor. No cranial nerve deficit or sensory deficit. She exhibits normal muscle tone. Coordination and gait normal.  Skin: Skin is warm and dry. No rash noted. No erythema. No pallor.  Psychiatric: She has a normal mood and affect. Her behavior is normal. Thought content normal. Cognition and memory are normal. She expresses no homicidal and  no suicidal ideation.          Assessment & Plan:

## 2013-01-18 NOTE — Assessment & Plan Note (Signed)
Multifactorial- stress/ menopausal mood change and hx of this in the past  Will inc prozac to 40 with warnings of side eff  Consider counseling if worse F/u 3 mo  Exercise- time for herself also disc >25 min spent with face to face with patient, >50% counseling and/or coordinating care

## 2013-01-19 ENCOUNTER — Telehealth: Payer: Self-pay | Admitting: *Deleted

## 2013-01-19 NOTE — Telephone Encounter (Signed)
Pt was seen by you yesterday and wanted me to let you know she made a mistake her last menstrual period was June 2012, not June 2013 as she told you. She wanted to see if that information is going to change the treatment plan you made for her

## 2013-01-19 NOTE — Telephone Encounter (Signed)
Pt.notified

## 2013-01-19 NOTE — Telephone Encounter (Signed)
No that does not change things  - but thanks for the heads up

## 2013-01-21 ENCOUNTER — Ambulatory Visit: Payer: BC Managed Care – PPO

## 2013-01-25 ENCOUNTER — Encounter: Payer: Self-pay | Admitting: Family Medicine

## 2013-01-25 ENCOUNTER — Ambulatory Visit: Payer: Self-pay | Admitting: Family Medicine

## 2013-01-25 ENCOUNTER — Telehealth: Payer: Self-pay | Admitting: Family Medicine

## 2013-01-25 ENCOUNTER — Ambulatory Visit (INDEPENDENT_AMBULATORY_CARE_PROVIDER_SITE_OTHER): Payer: BC Managed Care – PPO | Admitting: Family Medicine

## 2013-01-25 ENCOUNTER — Ambulatory Visit (INDEPENDENT_AMBULATORY_CARE_PROVIDER_SITE_OTHER)
Admission: RE | Admit: 2013-01-25 | Discharge: 2013-01-25 | Disposition: A | Discharge status: Home / Self Care | Payer: No Typology Code available for payment source | Source: Ambulatory Visit | Attending: Family Medicine | Admitting: Family Medicine

## 2013-01-25 VITALS — BP 110/64 | HR 93 | Temp 98.6°F | Ht 67.0 in | Wt 209.0 lb

## 2013-01-25 DIAGNOSIS — M79609 Pain in unspecified limb: Secondary | ICD-10-CM

## 2013-01-25 DIAGNOSIS — M79644 Pain in right finger(s): Secondary | ICD-10-CM

## 2013-01-25 NOTE — Telephone Encounter (Signed)
Patient Information:  Caller Name: Pamela Anthony  Phone: [phone]  Patient: Pamela Anthony, Pamela Anthony  Gender: Female  DOB: 03-23-1956  Age: 57 Years  PCP: Roxy Manns [facility])  Pregnant: No  Office Follow Up:  Does the office need to follow up with this patient?: No  Instructions For The Office: N/A  RN Note:  ON 01/20/13 patient had dog on leash with dog trying to get away from the snow.  This cause patient to hit her right thumb hard on an object.  Area is swollen and bruised.  At the time of injury has limited range of motion, but has been splinted since that time.  She is unsure if it is jammed or broken.  Did not seek treatment during the inclement weather.  Appointment scheduled today 02/17 at 12:30 with Dr. Clifton Custard.  Dr. Milinda Antis was full.  Symptoms  Reason For Call & Symptoms: Right thumb  Reviewed Health History In EMR: Yes  Reviewed Medications In EMR: Yes  Reviewed Allergies In EMR: Yes  Reviewed Surgeries / Procedures: Yes  Date of Onset of Symptoms: 01/20/2013  Treatments Tried: Iced and splinted  Treatments Tried Worked: Yes OB / GYN:  LMP: Unknown  Guideline(s) Used:  Finger Injury  Disposition Per Guideline:   See Today in Office  Reason For Disposition Reached:   Finger joint can't be opened (straightened) or closed (bent) completely  Advice Given:  Call Back If:  You become worse.  Appointment Scheduled:  01/25/2013 12:30:00 Appointment Scheduled Provider:  Ruthe Mannan [facility])

## 2013-01-26 NOTE — Progress Notes (Signed)
Nature conservation officer at [facility] [address] [place] [address] Phone: [phone] Fax: [phone]  Date:  01/25/2013   Name:  Pamela Anthony   DOB:  Jun 23, 1956   MRN:  [id number] Gender: female Age: 57 y.o.  Primary Physician:  Roxy Manns, MD  Evaluating MD: Hannah Beat, MD   Chief Complaint: thumb injury   History of Present Illness:  Pamela Anthony is a 57 y.o. pleasant patient who presents with the following:  The patient was walking her dog during the snow, and then it was jerked, her hand, and her thumb struck a hard object. She now has pain in the distal aspect and wonders if this is fractured.  Patient Active Problem List  Diagnosis  . POLYP, COLON  . B12 DEFICIENCY  . VITAMIN D DEFICIENCY  . HYPERLIPIDEMIA  . STRESS REACTION, ACUTE, WITH EMOTIONAL DISTURBANCE  . ALLERGIC RHINITIS  . GERD  . HIATAL HERNIA  . IRRITABLE BOWEL SYNDROME  . ARTHRALGIA  . NECK PAIN  . LOW BACK PAIN  . SLEEP APNEA  . FATIGUE  . URINARY INCONTINENCE  . MIGRAINES, HX OF  . Routine general medical examination at a health care facility  . Other screening mammogram  . Post-menopausal  . Depression with anxiety    Past Medical History  Diagnosis Date  . Allergic rhinitis   . Iron deficiency anemia   . GERD (gastroesophageal reflux disease)     EGD 10/2004  . Hyperlipemia   . Low back pain   . Urinary incontinence   . Obesity   . B12 deficiency   . Vitamin D deficiency     mild  . Degenerative disc disease     back  . Fungal infection 2005    lamasil   . Hemorrhoids, internal     colonoscopy 02/2008    Past Surgical History  Procedure Laterality Date  . Cesarean section  1985  . Nasal sinus surgery  2000's    right maxillary  . Tonsillectomy and adenoidectomy    . Knee surgery  1970's    right  . Hand surgery  2000    nodule removed  . Dilation and curettage of uterus  2005    History  Substance Use Topics  . Smoking status: Never Smoker     . Smokeless tobacco: Never Used  . Alcohol Use: Yes     Comment: occasional    Family History  Problem Relation Age of Onset  . Coronary artery disease Father     in his 71's  . Arthritis Father   . Stroke Father   . Heart disease Father     pacemaker    Allergies  Allergen Reactions  . Atorvastatin     REACTION: myalgias  . Oxybutynin     REACTION: rash    Medication list has been reviewed and updated.  Outpatient Prescriptions Prior to Visit  Medication Sig Dispense Refill  . Calcium Carbonate-Vitamin D (CALTRATE 600+D) 600-400 MG-UNIT per tablet Take 1 tablet by mouth daily.        . cholecalciferol (VITAMIN D) 1000 UNITS tablet Takes 4 tablets daily       . cyanocobalamin (,VITAMIN B-12,) 1000 MCG/ML injection One injection every 6 weeks.       . cyclobenzaprine (FLEXERIL) 10 MG tablet Take 1 tablet (10 mg total) by mouth at bedtime as needed. For muscle spasm and pain  30 tablet  0  . eletriptan (RELPAX) 40 MG tablet One tablet  by mouth at onset of headache. May repeat in 2 hours if headache persists or recurs. may repeat in 2 hours if necessary  10 tablet  1  . ezetimibe-simvastatin (VYTORIN) 10-20 MG per tablet Take 1 tablet by mouth at bedtime.  90 tablet  3  . FLUoxetine (PROZAC) 20 MG tablet Take 2 tablets (40 mg total) by mouth daily.  60 tablet  11  . hydrochlorothiazide (HYDRODIURIL) 25 MG tablet TAKE ONE-HALF TO ONE TABLET BY MOUTH EVERY DAY AS NEEDED  30 tablet  11  . Omega-3 Fatty Acids (FISH OIL) 1200 MG CAPS Take 1 capsule by mouth daily.        Marland Kitchen omeprazole (PRILOSEC) 20 MG capsule Take 1 capsule (20 mg total) by mouth daily.  90 capsule  3  . solifenacin (VESICARE) 10 MG tablet Take 1 tablet (10 mg total) by mouth daily.  90 tablet  3  . topiramate (TOPAMAX) 100 MG tablet TAKE ONE TABLET BY MOUTH EVERY DAY  30 tablet  5   No facility-administered medications prior to visit.    Review of Systems:   GEN: No fevers, chills. Nontoxic. Primarily MSK c/o  today. MSK: Detailed in the HPI GI: tolerating PO intake without difficulty Neuro: No numbness, parasthesias, or tingling associated. Otherwise the pertinent positives of the ROS are noted above.    Physical Examination: BP 110/64  Pulse 93  Temp(Src) 98.6 F (37 C) (Oral)  Ht 5\' 7"  (1.702 m)  Wt 209 lb (94.802 kg)  BMI 32.73 kg/m2  SpO2 97%  LMP 04/17/2011  Ideal Body Weight: Weight in (lb) to have BMI = 25: 159.3   GEN: WDWN, NAD, Non-toxic, Alert & Oriented x 3 HEENT: Atraumatic, Normocephalic.  Ears and Nose: No external deformity. EXTR: No clubbing/cyanosis/edema NEURO: Normal gait.  PSYCH: Normally interactive. Conversant. Not depressed or anxious appearing.  Calm demeanor.   nontender throughout all metacarpals. Nontender in the wrist. Nontender in the anatomical snuff box. Nontender along the radius and ulna. Nontender at the MCP 1st digit on the RIGHT. The IP digit is tender. There is tenderness with stressing the radial and ulnar collateral ligaments.  Dg Finger Thumb Right  01/25/2013  *RADIOLOGY REPORT*  Clinical Data:  Right thumb injury, pain  RIGHT THUMB 2+V  Comparison: None.  Findings: Mild degenerative changes of the right thumb interphalangeal joint distally with bony spurring and joint space narrowing.  Normal alignment.  No displaced fracture or radiographic swelling.  No radiopaque foreign body.  IMPRESSION: Right thumb IP joint degenerative change.  No acute osseous finding.   Original Report Authenticated By: Judie Petit. Miles Costain, M.D.     Assessment and Plan:  Thumb pain, right - Plan: DG Finger Thumb Right  Sprained ulnar and radial collateral ligaments of the thumb in the IP joint. Placed in stax splint. Use for next 2 weeks, particularly at work.  Orders Today:  Orders Placed This Encounter  Procedures  . DG Finger Thumb Right    Standing Status: Future     Number of Occurrences: 1     Standing Expiration Date: 03/27/2014    Order Specific Question:  Reason  for exam:    Answer:  trauma, distal thumb, eval for fx    Order Specific Question:  Is the patient pregnant?    Answer:  No    Order Specific Question:  Preferred imaging location?    Answer:  [facility]    Updated Medication List: (Includes new medications, updates to list, dose adjustments)  No orders of the defined types were placed in this encounter.    Medications Discontinued: There are no discontinued medications.    Signed, Elpidio Galea. Eternity Dexter, MD 01/26/2013 9:41 AM

## 2013-03-04 ENCOUNTER — Ambulatory Visit (INDEPENDENT_AMBULATORY_CARE_PROVIDER_SITE_OTHER): Payer: No Typology Code available for payment source | Admitting: *Deleted

## 2013-03-04 DIAGNOSIS — E538 Deficiency of other specified B group vitamins: Secondary | ICD-10-CM

## 2013-03-04 MED ORDER — CYANOCOBALAMIN 1000 MCG/ML IJ SOLN
1000.0000 ug | Freq: Once | INTRAMUSCULAR | Status: AC
  Administered 2013-03-04: 1000 ug via INTRAMUSCULAR

## 2013-03-29 ENCOUNTER — Encounter: Payer: Self-pay | Admitting: Family Medicine

## 2013-03-29 ENCOUNTER — Ambulatory Visit (INDEPENDENT_AMBULATORY_CARE_PROVIDER_SITE_OTHER): Payer: BC Managed Care – PPO | Admitting: Family Medicine

## 2013-03-29 VITALS — BP 108/72 | HR 88 | Temp 99.7°F | Ht 67.0 in | Wt 211.5 lb

## 2013-03-29 DIAGNOSIS — J069 Acute upper respiratory infection, unspecified: Secondary | ICD-10-CM | POA: Insufficient documentation

## 2013-03-29 DIAGNOSIS — R05 Cough: Secondary | ICD-10-CM

## 2013-03-29 DIAGNOSIS — R509 Fever, unspecified: Secondary | ICD-10-CM

## 2013-03-29 LAB — POCT INFLUENZA A/B
Influenza A, POC: NEGATIVE
Influenza B, POC: NEGATIVE

## 2013-03-29 MED ORDER — HYDROCOD POLST-CHLORPHEN POLST 10-8 MG/5ML PO LQCR: 5.0000 mL | Freq: Two times a day (BID) | ORAL | Status: DC | PRN

## 2013-03-29 NOTE — Assessment & Plan Note (Signed)
Disc symptomatic care - see instructions on AVS  Given tussionex for cough prn with caution  Disc fluids and rest  Neg rapid flu test Update if not starting to improve in a week or if worsening   Off work 2 d

## 2013-03-29 NOTE — Progress Notes (Signed)
Subjective:    Patient ID: Pamela Anthony, female    DOB: Aug 12, 1956, 57 y.o.   MRN: [id number]  HPI Here with flu like symptoms Rapid flu test neg today  Started yesterday with a bad cough (was traveling in [place])  Drove home and she began to have bad chills and body aches -- had to get under the covers and rigors/ could not get warm Temp of 100 Stayed in bed- woke up sweating   Cough is rattly / chest feels thick- but not coughing anything up   Ears are sensitive  Headache-pressure  Not a lot of nasal symptoms  Some drip   2 people at work had bad cough  Patient Active Problem List  Diagnosis  . POLYP, COLON  . B12 DEFICIENCY  . VITAMIN D DEFICIENCY  . HYPERLIPIDEMIA  . STRESS REACTION, ACUTE, WITH EMOTIONAL DISTURBANCE  . ALLERGIC RHINITIS  . GERD  . HIATAL HERNIA  . IRRITABLE BOWEL SYNDROME  . ARTHRALGIA  . NECK PAIN  . LOW BACK PAIN  . SLEEP APNEA  . FATIGUE  . URINARY INCONTINENCE  . MIGRAINES, HX OF  . Routine general medical examination at a health care facility  . Other screening mammogram  . Post-menopausal  . Depression with anxiety   Past Medical History  Diagnosis Date  . Allergic rhinitis   . Iron deficiency anemia   . GERD (gastroesophageal reflux disease)     EGD 10/2004  . Hyperlipemia   . Low back pain   . Urinary incontinence   . Obesity   . B12 deficiency   . Vitamin D deficiency     mild  . Degenerative disc disease     back  . Fungal infection 2005    lamasil   . Hemorrhoids, internal     colonoscopy 02/2008   Past Surgical History  Procedure Laterality Date  . Cesarean section  1985  . Nasal sinus surgery  2000's    right maxillary  . Tonsillectomy and adenoidectomy    . Knee surgery  1970's    right  . Hand surgery  2000    nodule removed  . Dilation and curettage of uterus  2005   History  Substance Use Topics  . Smoking status: Never Smoker   . Smokeless tobacco: Never Used  . Alcohol Use: Yes     Comment:  occasional   Family History  Problem Relation Age of Onset  . Coronary artery disease Father     in his 76's  . Arthritis Father   . Stroke Father   . Heart disease Father     pacemaker   Allergies  Allergen Reactions  . Atorvastatin     REACTION: myalgias  . Oxybutynin     REACTION: rash   Current Outpatient Prescriptions on File Prior to Visit  Medication Sig Dispense Refill  . Calcium Carbonate-Vitamin D (CALTRATE 600+D) 600-400 MG-UNIT per tablet Take 1 tablet by mouth daily.        . cholecalciferol (VITAMIN D) 1000 UNITS tablet Takes 4 tablets daily       . cyanocobalamin (,VITAMIN B-12,) 1000 MCG/ML injection One injection every 6 weeks.       . cyclobenzaprine (FLEXERIL) 10 MG tablet Take 1 tablet (10 mg total) by mouth at bedtime as needed. For muscle spasm and pain  30 tablet  0  . eletriptan (RELPAX) 40 MG tablet One tablet by mouth at onset of headache. May repeat in 2 hours if headache persists  or recurs. may repeat in 2 hours if necessary  10 tablet  1  . ezetimibe-simvastatin (VYTORIN) 10-20 MG per tablet Take 1 tablet by mouth at bedtime.  90 tablet  3  . FLUoxetine (PROZAC) 20 MG tablet Take 2 tablets (40 mg total) by mouth daily.  60 tablet  11  . hydrochlorothiazide (HYDRODIURIL) 25 MG tablet TAKE ONE-HALF TO ONE TABLET BY MOUTH EVERY DAY AS NEEDED  30 tablet  11  . Omega-3 Fatty Acids (FISH OIL) 1200 MG CAPS Take 1 capsule by mouth daily.        Marland Kitchen omeprazole (PRILOSEC) 20 MG capsule Take 1 capsule (20 mg total) by mouth daily.  90 capsule  3  . solifenacin (VESICARE) 10 MG tablet Take 1 tablet (10 mg total) by mouth daily.  90 tablet  3  . topiramate (TOPAMAX) 100 MG tablet TAKE ONE TABLET BY MOUTH EVERY DAY  30 tablet  5   No current facility-administered medications on file prior to visit.       Review of Systems    Review of Systems  Constitutional: Negative for fever, appetite change, fatigue and unexpected weight change.  Eyes: Negative for pain and  visual disturbance.  ENT neg for ST or sinus pain  Respiratory: Negative for wheeze  and shortness of breath.   Cardiovascular: Negative for cp or palpitations    Gastrointestinal: Negative for nausea, diarrhea and constipation.  Genitourinary: Negative for urgency and frequency.  Skin: Negative for pallor or rash   Neurological: Negative for weakness, light-headedness, numbness and headaches.  Hematological: Negative for adenopathy. Does not bruise/bleed easily.  Psychiatric/Behavioral: Negative for dysphoric mood. The patient is not nervous/anxious.      Objective:   Physical Exam  Constitutional: She appears well-developed and well-nourished. No distress.  HENT:  Head: Normocephalic and atraumatic.  Right Ear: External ear normal.  Left Ear: External ear normal.  Mouth/Throat: Oropharynx is clear and moist.  Nares are boggy and injected No sinus tenderness  Eyes: Conjunctivae and EOM are normal. Pupils are equal, round, and reactive to light. Right eye exhibits no discharge. Left eye exhibits no discharge.  Neck: Normal range of motion. Neck supple.  Cardiovascular: Normal rate and regular rhythm.   Pulmonary/Chest: Effort normal and breath sounds normal. No respiratory distress. She has no wheezes. She has no rales.  Harsh bs throughout   Musculoskeletal: She exhibits no edema.  Lymphadenopathy:    She has no cervical adenopathy.  Neurological: She is alert. She has normal reflexes.  Skin: Skin is warm and dry. No rash noted. No erythema. No pallor.  Psychiatric: She has a normal mood and affect.          Assessment & Plan:

## 2013-03-29 NOTE — Patient Instructions (Addendum)
I think you have a viral upper respiratory infection  Try the tussionex for cough - with caution  Ibuprofen for fever/ headache Lots of fluids and rest  I would like you to stay out of work for several days and rest

## 2013-03-30 ENCOUNTER — Ambulatory Visit: Payer: BC Managed Care – PPO | Admitting: Family Medicine

## 2013-04-15 ENCOUNTER — Ambulatory Visit (INDEPENDENT_AMBULATORY_CARE_PROVIDER_SITE_OTHER): Payer: BC Managed Care – PPO | Admitting: *Deleted

## 2013-04-15 DIAGNOSIS — E538 Deficiency of other specified B group vitamins: Secondary | ICD-10-CM

## 2013-04-15 MED ORDER — CYANOCOBALAMIN 1000 MCG/ML IJ SOLN
1000.0000 ug | Freq: Once | INTRAMUSCULAR | Status: AC
  Administered 2013-04-15: 1000 ug via INTRAMUSCULAR

## 2013-04-19 ENCOUNTER — Ambulatory Visit: Payer: BC Managed Care – PPO | Admitting: Family Medicine

## 2013-04-20 ENCOUNTER — Ambulatory Visit (INDEPENDENT_AMBULATORY_CARE_PROVIDER_SITE_OTHER): Payer: BC Managed Care – PPO | Admitting: Family Medicine

## 2013-04-20 ENCOUNTER — Encounter: Payer: Self-pay | Admitting: Family Medicine

## 2013-04-20 VITALS — BP 112/76 | HR 80 | Temp 98.9°F | Ht 67.0 in | Wt 210.8 lb

## 2013-04-20 DIAGNOSIS — F418 Other specified anxiety disorders: Secondary | ICD-10-CM

## 2013-04-20 DIAGNOSIS — E538 Deficiency of other specified B group vitamins: Secondary | ICD-10-CM

## 2013-04-20 DIAGNOSIS — F341 Dysthymic disorder: Secondary | ICD-10-CM
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
## 2013-04-20 NOTE — Assessment & Plan Note (Signed)
Since injections are no longer avail- will have her get 1000 mcg tabs otc and take qd Will update if problems or symptoms

## 2013-04-20 NOTE — Assessment & Plan Note (Signed)
Doing much better on prozac 40 mg  Not much in the way of side eff - was a bit dizzy at first  Feels like emotions are blunted - but she does not mind- and no anhedonia  Disc stressors again - it sounds like she needs to seek alt employment  Will f/u in fall as planned

## 2013-04-20 NOTE — Patient Instructions (Addendum)
Stay on the prozac 40 mg for now  If problems let me know Get vitamin B12 1000 mcg and take once daily - while the injection is not avail Try to get some exercise  Schedule annual physical for fall (after sept 6) on the way out ,with labs prior

## 2013-04-26 ENCOUNTER — Other Ambulatory Visit: Payer: Self-pay | Admitting: Family Medicine

## 2013-05-02 ENCOUNTER — Other Ambulatory Visit: Payer: Self-pay | Admitting: Family Medicine

## 2013-08-24 ENCOUNTER — Telehealth: Payer: Self-pay | Admitting: Family Medicine

## 2013-08-24 DIAGNOSIS — Z0000 Encounter for general adult medical examination without abnormal findings: Secondary | ICD-10-CM

## 2013-08-24 DIAGNOSIS — E538 Deficiency of other specified B group vitamins: Secondary | ICD-10-CM

## 2013-08-24 DIAGNOSIS — E559 Vitamin D deficiency, unspecified: Secondary | ICD-10-CM

## 2013-08-24 NOTE — Telephone Encounter (Signed)
Message copied by [name] on Tue Aug 24, 2013  8:31 AM ------      Message from: Alvina Chou      Created: Thu Aug 19, 2013 11:14 AM      Regarding: Lab orders for Wednesday, 9.17.14       Patient is scheduled for CPX labs, please order future labs, Thanks , Terri       ------

## 2013-08-25 ENCOUNTER — Other Ambulatory Visit (INDEPENDENT_AMBULATORY_CARE_PROVIDER_SITE_OTHER): Payer: No Typology Code available for payment source

## 2013-08-25 DIAGNOSIS — E559 Vitamin D deficiency, unspecified: Secondary | ICD-10-CM

## 2013-08-25 DIAGNOSIS — E785 Hyperlipidemia, unspecified: Secondary | ICD-10-CM

## 2013-08-25 DIAGNOSIS — Z0000 Encounter for general adult medical examination without abnormal findings: Secondary | ICD-10-CM

## 2013-08-25 DIAGNOSIS — E538 Deficiency of other specified B group vitamins: Secondary | ICD-10-CM

## 2013-08-25 LAB — CBC WITH DIFFERENTIAL/PLATELET
Basophils Relative: 0.7 % (ref 0.0–3.0)
Eosinophils Relative: 1.3 % (ref 0.0–5.0)
Hemoglobin: 12.7 g/dL (ref 12.0–15.0)
Lymphocytes Relative: 25.4 % (ref 12.0–46.0)
Monocytes Relative: 7.2 % (ref 3.0–12.0)
Neutro Abs: 2.5 10*3/uL (ref 1.4–7.7)
RBC: 4.33 Mil/uL (ref 3.87–5.11)

## 2013-08-25 LAB — COMPREHENSIVE METABOLIC PANEL
ALT: 16 U/L (ref 0–35)
BUN: 17 mg/dL (ref 6–23)
CO2: 28 mEq/L (ref 19–32)
Calcium: 8.9 mg/dL (ref 8.4–10.5)
Chloride: 106 mEq/L (ref 96–112)
Creatinine, Ser: 0.8 mg/dL (ref 0.4–1.2)
GFR: 79.88 mL/min (ref >60.00)

## 2013-08-25 LAB — LIPID PANEL: HDL: 38.3 mg/dL — ABNORMAL LOW (ref >39.00)

## 2013-08-26 LAB — VITAMIN D 25 HYDROXY (VIT D DEFICIENCY, FRACTURES): Vit D, 25-Hydroxy: 41 ng/mL (ref 30–89)

## 2013-08-29 ENCOUNTER — Other Ambulatory Visit: Payer: Self-pay | Admitting: Family Medicine

## 2013-09-01 ENCOUNTER — Ambulatory Visit (INDEPENDENT_AMBULATORY_CARE_PROVIDER_SITE_OTHER): Payer: No Typology Code available for payment source | Admitting: Family Medicine

## 2013-09-01 ENCOUNTER — Encounter: Payer: Self-pay | Admitting: Family Medicine

## 2013-09-01 ENCOUNTER — Other Ambulatory Visit (HOSPITAL_COMMUNITY)
Admission: RE | Admit: 2013-09-01 | Discharge: 2013-09-01 | Disposition: A | Discharge status: Home / Self Care | Payer: No Typology Code available for payment source | Source: Ambulatory Visit | Attending: Family Medicine | Admitting: Family Medicine

## 2013-09-01 VITALS — BP 108/78 | HR 76 | Temp 98.5°F | Ht 66.75 in | Wt 211.2 lb

## 2013-09-01 DIAGNOSIS — Z23 Encounter for immunization: Secondary | ICD-10-CM

## 2013-09-01 DIAGNOSIS — Z0000 Encounter for general adult medical examination without abnormal findings: Secondary | ICD-10-CM

## 2013-09-01 DIAGNOSIS — Z01419 Encounter for gynecological examination (general) (routine) without abnormal findings: Secondary | ICD-10-CM | POA: Insufficient documentation

## 2013-09-01 DIAGNOSIS — Z1151 Encounter for screening for human papillomavirus (HPV): Secondary | ICD-10-CM | POA: Insufficient documentation

## 2013-09-01 DIAGNOSIS — E538 Deficiency of other specified B group vitamins: Secondary | ICD-10-CM

## 2013-09-01 DIAGNOSIS — E559 Vitamin D deficiency, unspecified: Secondary | ICD-10-CM

## 2013-09-01 DIAGNOSIS — E785 Hyperlipidemia, unspecified: Secondary | ICD-10-CM

## 2013-09-01 MED ORDER — TOPIRAMATE 100 MG PO TABS: 100.0000 mg | ORAL_TABLET | Freq: Every day | ORAL | Status: DC

## 2013-09-01 MED ORDER — ROSUVASTATIN CALCIUM 10 MG PO TABS: 10.0000 mg | ORAL_TABLET | Freq: Every day | ORAL | Status: DC

## 2013-09-01 MED ORDER — CYCLOBENZAPRINE HCL 10 MG PO TABS: 10.0000 mg | ORAL_TABLET | Freq: Every evening | ORAL | Status: DC | PRN

## 2013-09-01 NOTE — Assessment & Plan Note (Signed)
Pt can no longer afford vytorin Intol of atorvastatin Will try crestor 10 mg and update if side eff Disc goals for lipids and reasons to control them Rev labs with pt Rev low sat fat diet in detail  Re check in 6 weeks

## 2013-09-01 NOTE — Progress Notes (Signed)
Subjective:    Patient ID: Pamela Anthony, female    DOB: 09-02-1956, 57 y.o.   MRN: [id number]  HPI Here for health maintenance exam and to review chronic medical problems    Feels ok - nothing new going on - same health issues  Can no longer afford vytorin    Wt is up 1 lb with bmi of 33   Pap 5/11 nl  Does not see a gyn  No hx of abn pap  Time for 3 year pap   Flu vaccine will get that today  Td 8/05  Mammogram 9/13 normal- will get that scheduled  Self exam -no lumps or changes   colonosc 3/09 normal- 10 year f/u   dexa 9/13 normal Vit D level is 41 Does not take calcium   B12 is 589  Mood - is overall ok - some days better than others but increasing her fluoxetine helped a lot - uses her relaxation techniques and she stays motivated  Job is very stressful    Hyperlipidemia She had to start taking her vytorin - could better afford crestor  Lab Results  Component Value Date   CHOL 237* 08/25/2013   CHOL 153 09/25/2012   CHOL 252* 08/07/2012   Lab Results  Component Value Date   HDL 38.30* 08/25/2013   HDL 42.50 09/25/2012   HDL 43.20 08/07/2012   Lab Results  Component Value Date   LDLCALC 87 09/25/2012   LDLCALC 75 05/26/2009   LDLCALC 134* 03/08/2009   Lab Results  Component Value Date   TRIG 152.0* 08/25/2013   TRIG 118.0 09/25/2012   TRIG 178.0* 08/07/2012   Lab Results  Component Value Date   CHOLHDL 6 08/25/2013   CHOLHDL 4 09/25/2012   CHOLHDL 6 08/07/2012   Lab Results  Component Value Date   LDLDIRECT 182.2 08/25/2013   LDLDIRECT 183.6 08/07/2012   LDLDIRECT 170.8 04/23/2011  went way up off vytorin Diet is fairly good  Not enough exericse  Patient Active Problem List   Diagnosis Date Noted  . Encounter for routine gynecological examination 09/01/2013  . Depression with anxiety 01/18/2013  . Post-menopausal 08/14/2012  . Other screening mammogram 04/29/2011  . Routine general medical examination at a health care facility 04/22/2011   . ARTHRALGIA 02/28/2010  . VITAMIN D DEFICIENCY 03/08/2009  . IRRITABLE BOWEL SYNDROME 06/27/2008  . B12 DEFICIENCY 02/23/2008  . POLYP, COLON 03/06/2007  . HYPERLIPIDEMIA 03/06/2007  . ALLERGIC RHINITIS 03/06/2007  . GERD 03/06/2007  . HIATAL HERNIA 03/06/2007  . LOW BACK PAIN 03/06/2007  . SLEEP APNEA 03/06/2007  . URINARY INCONTINENCE 03/06/2007  . MIGRAINES, HX OF 03/06/2007   Past Medical History  Diagnosis Date  . Allergic rhinitis   . Iron deficiency anemia   . GERD (gastroesophageal reflux disease)     EGD 10/2004  . Hyperlipemia   . Low back pain   . Urinary incontinence   . Obesity   . B12 deficiency   . Vitamin D deficiency     mild  . Degenerative disc disease     back  . Fungal infection 2005    lamasil   . Hemorrhoids, internal     colonoscopy 02/2008   Past Surgical History  Procedure Laterality Date  . Cesarean section  1985  . Nasal sinus surgery  2000's    right maxillary  . Tonsillectomy and adenoidectomy    . Knee surgery  1970's    right  . Hand surgery  2000  nodule removed  . Dilation and curettage of uterus  2005   History  Substance Use Topics  . Smoking status: Never Smoker   . Smokeless tobacco: Never Used  . Alcohol Use: Yes     Comment: occasional   Family History  Problem Relation Age of Onset  . Coronary artery disease Father     in his 56's  . Arthritis Father   . Stroke Father   . Heart disease Father     pacemaker   Allergies  Allergen Reactions  . Atorvastatin     REACTION: myalgias  . Oxybutynin     REACTION: rash   Current Outpatient Prescriptions on File Prior to Visit  Medication Sig Dispense Refill  . eletriptan (RELPAX) 40 MG tablet One tablet by mouth at onset of headache. May repeat in 2 hours if headache persists or recurs. may repeat in 2 hours if necessary  10 tablet  1  . FLUoxetine (PROZAC) 20 MG tablet Take 2 tablets (40 mg total) by mouth daily.  60 tablet  11  . hydrochlorothiazide  (HYDRODIURIL) 25 MG tablet TAKE ONE-HALF TO ONE TABLET BY MOUTH EVERY DAY AS NEEDED  30 tablet  4  . Omega-3 Fatty Acids (FISH OIL) 1200 MG CAPS Take 1 capsule by mouth daily.        Marland Kitchen omeprazole (PRILOSEC) 20 MG capsule Take 1 capsule (20 mg total) by mouth daily.  90 capsule  3  . solifenacin (VESICARE) 10 MG tablet Take 1 tablet (10 mg total) by mouth daily.  90 tablet  3  . Calcium Carbonate-Vitamin D (CALTRATE 600+D) 600-400 MG-UNIT per tablet Take 1 tablet by mouth daily.        . chlorpheniramine-HYDROcodone (TUSSIONEX PENNKINETIC ER) 10-8 MG/5ML LQCR Take 5 mLs by mouth every 12 (twelve) hours as needed.  140 mL  0   No current facility-administered medications on file prior to visit.    Review of Systems Review of Systems  Constitutional: Negative for fever, appetite change,  and unexpected weight change. pos for chronic fatigue  Eyes: Negative for pain and visual disturbance.  Respiratory: Negative for cough and shortness of breath.   Cardiovascular: Negative for cp or palpitations    Gastrointestinal: Negative for nausea, diarrhea and constipation.  Genitourinary: Negative for urgency and frequency.  Skin: Negative for pallor or rash   Neurological: Negative for weakness, light-headedness, numbness and headaches.  Hematological: Negative for adenopathy. Does not bruise/bleed easily.  Psychiatric/Behavioral: Negative for dysphoric mood. The patient is not nervous/anxious.  (depression is currently controlled)       Objective:   Physical Exam  Constitutional: She appears well-developed and well-nourished. No distress.  obese and well appearing   HENT:  Head: Normocephalic and atraumatic.  Right Ear: External ear normal.  Left Ear: External ear normal.  Nose: Nose normal.  Mouth/Throat: Oropharynx is clear and moist.  Eyes: Conjunctivae and EOM are normal. Pupils are equal, round, and reactive to light. Right eye exhibits no discharge. Left eye exhibits no discharge. No  scleral icterus.  Neck: Normal range of motion. Neck supple. No JVD present. Carotid bruit is not present. No thyromegaly present.  Cardiovascular: Normal rate, regular rhythm, normal heart sounds and intact distal pulses.  Exam reveals no gallop.   Pulmonary/Chest: Effort normal and breath sounds normal. No respiratory distress. She has no wheezes. She exhibits no tenderness.  Abdominal: Soft. Bowel sounds are normal. She exhibits no distension, no abdominal bruit and no mass. There is no  tenderness.  Genitourinary: No breast swelling, tenderness, discharge or bleeding. There is no rash, tenderness or lesion on the right labia. There is no rash, tenderness or lesion on the left labia. Uterus is not enlarged and not tender. Cervix exhibits no motion tenderness, no discharge and no friability. Right adnexum displays no mass, no tenderness and no fullness. Left adnexum displays no mass, no tenderness and no fullness. No tenderness or bleeding around the vagina. No vaginal discharge found.  Breast exam: No mass, nodules, thickening, tenderness, bulging, retraction, inflamation, nipple discharge or skin changes noted.  No axillary or clavicular LA.  Chaperoned exam.    Pt states she will schedule her own mammogram   Musculoskeletal: She exhibits no edema and no tenderness.  Lymphadenopathy:    She has no cervical adenopathy.  Neurological: She is alert. She has normal reflexes. No cranial nerve deficit. She exhibits normal muscle tone. Coordination normal.  Skin: Skin is warm and dry. No rash noted. No erythema. No pallor.  Psychiatric: She has a normal mood and affect.          Assessment & Plan:

## 2013-09-01 NOTE — Assessment & Plan Note (Signed)
Exam done with 3 year pap No c/o Pt will schedule her own mammogram

## 2013-09-01 NOTE — Assessment & Plan Note (Signed)
Lab Results  Component Value Date   VITAMINB12 589 08/25/2013    Ok with current suppl

## 2013-09-01 NOTE — Assessment & Plan Note (Signed)
Reviewed health habits including diet and exercise and skin cancer prevention Also reviewed health mt list, fam hx and immunizations  Disc imp of wt loss Wellness labs rev She will schedule her own mammogram Flu vaccine today

## 2013-09-01 NOTE — Assessment & Plan Note (Signed)
D level in nl range now with otc supplementation  Disc imp for bone and overall health

## 2013-09-01 NOTE — Patient Instructions (Addendum)
Flu vaccine today  Change to crestor for cholesterol control Schedule fasting lab in 6 weeks Keep working on healthy diet and exercise  Pap done today

## 2013-09-07 ENCOUNTER — Encounter: Payer: Self-pay | Admitting: *Deleted

## 2013-09-11 ENCOUNTER — Other Ambulatory Visit: Payer: Self-pay | Admitting: Family Medicine

## 2013-09-12 IMAGING — CR DG LUMBAR SPINE COMPLETE 4+V
5 series · 5 of 5 positions shown · non-contrast
Comparison: None.

CLINICAL DATA: Lower back pain radiating into the right leg.

LUMBAR SPINE - COMPLETE 4+ VIEW

[view not recorded (1 of 5)]
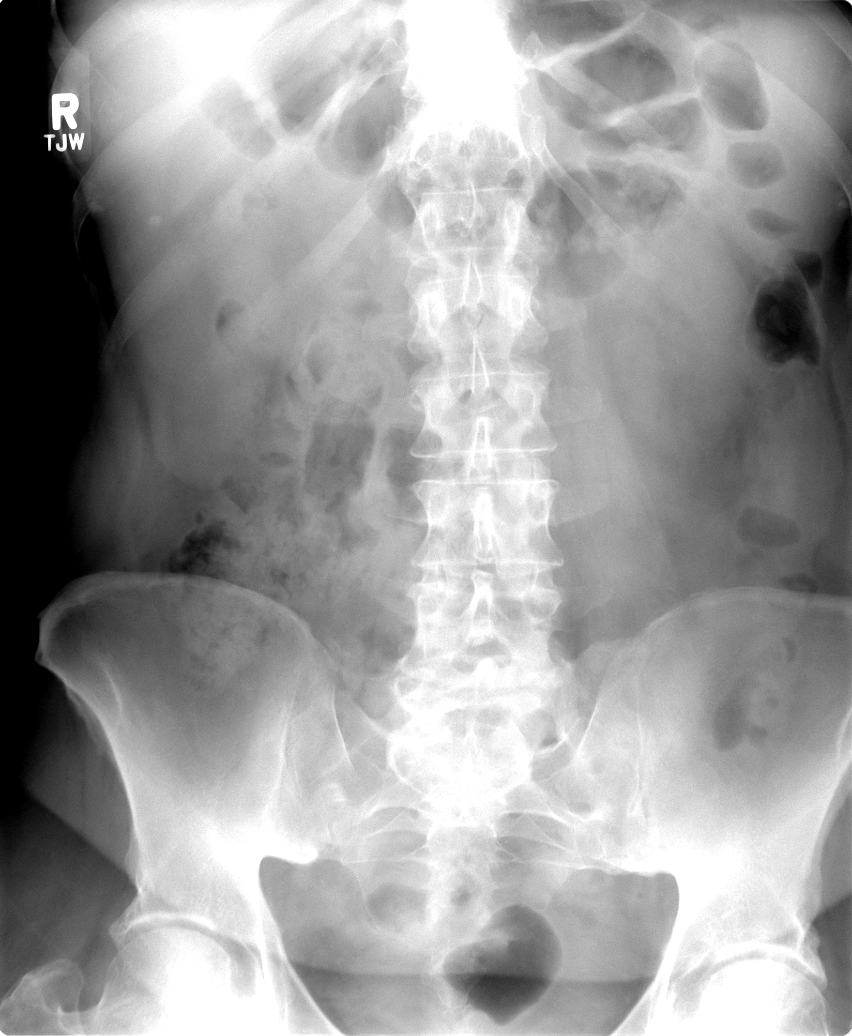

[view not recorded (2 of 5)]
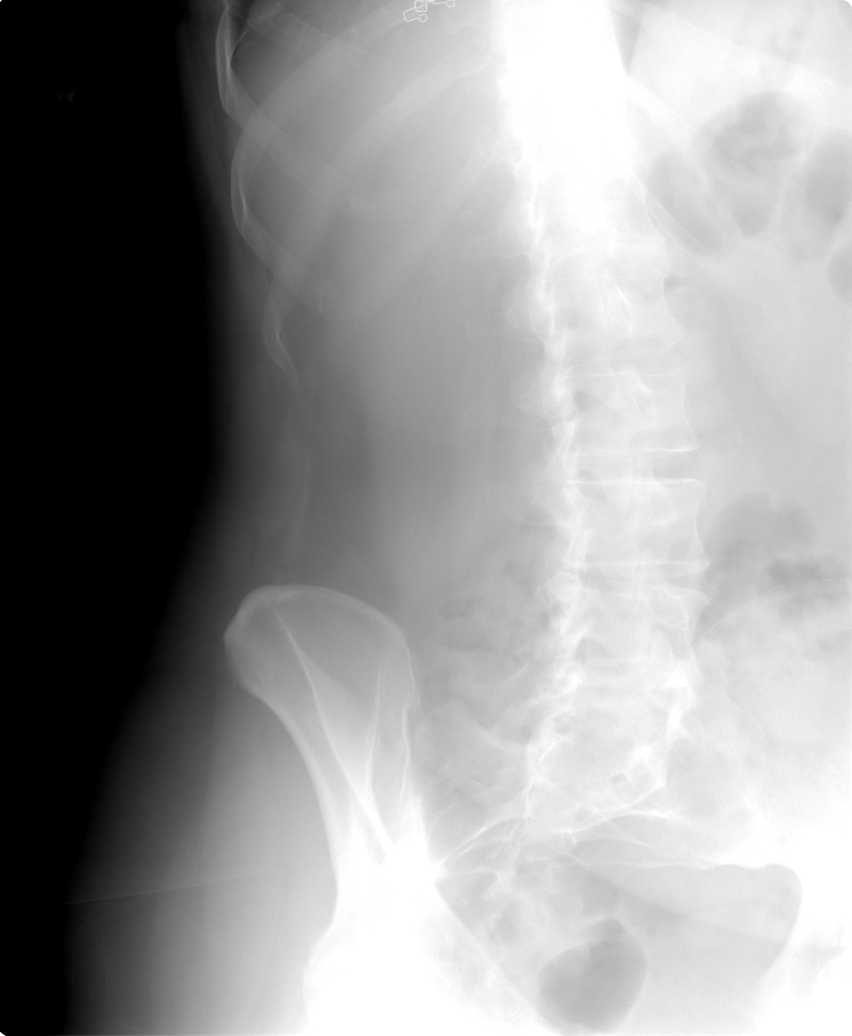

[view not recorded (3 of 5)]
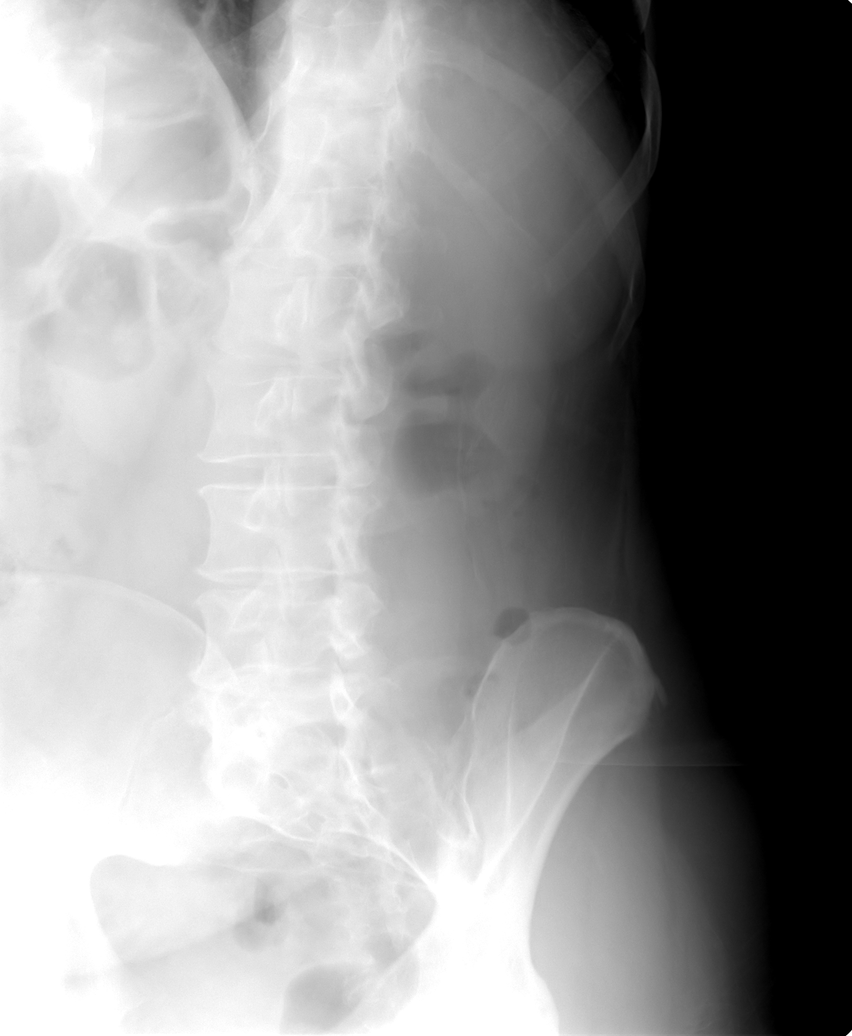

[view not recorded (4 of 5)]
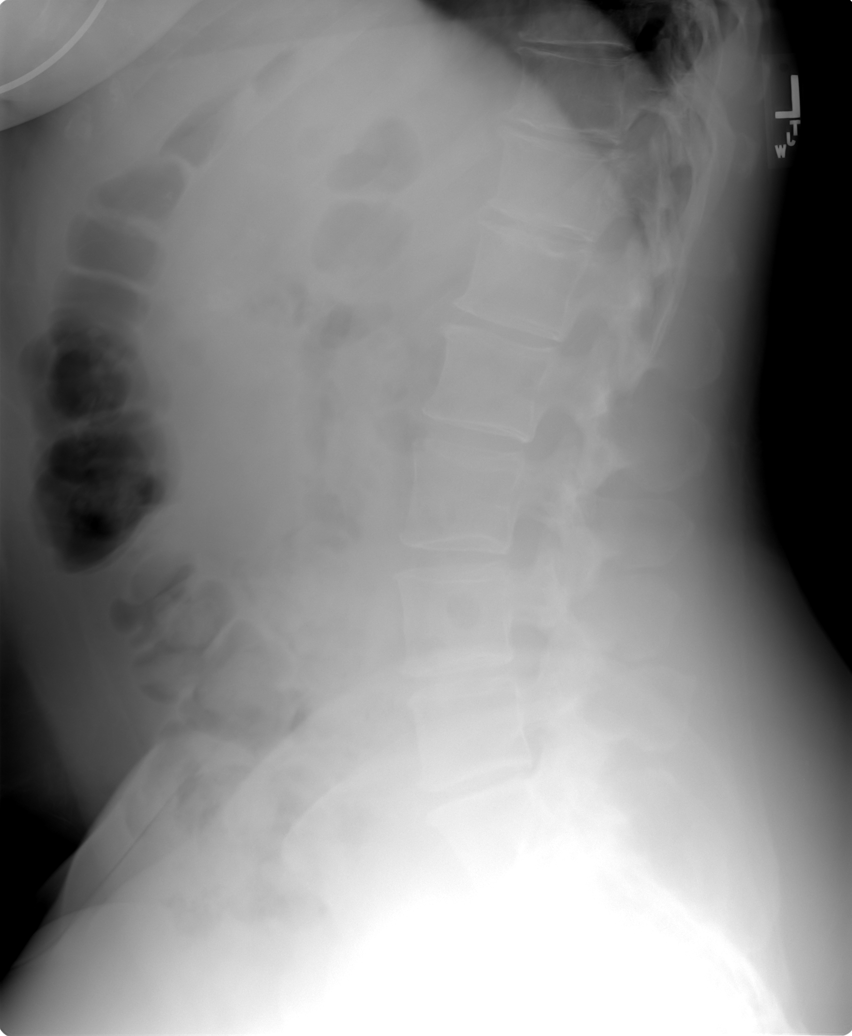

[view not recorded (5 of 5)]
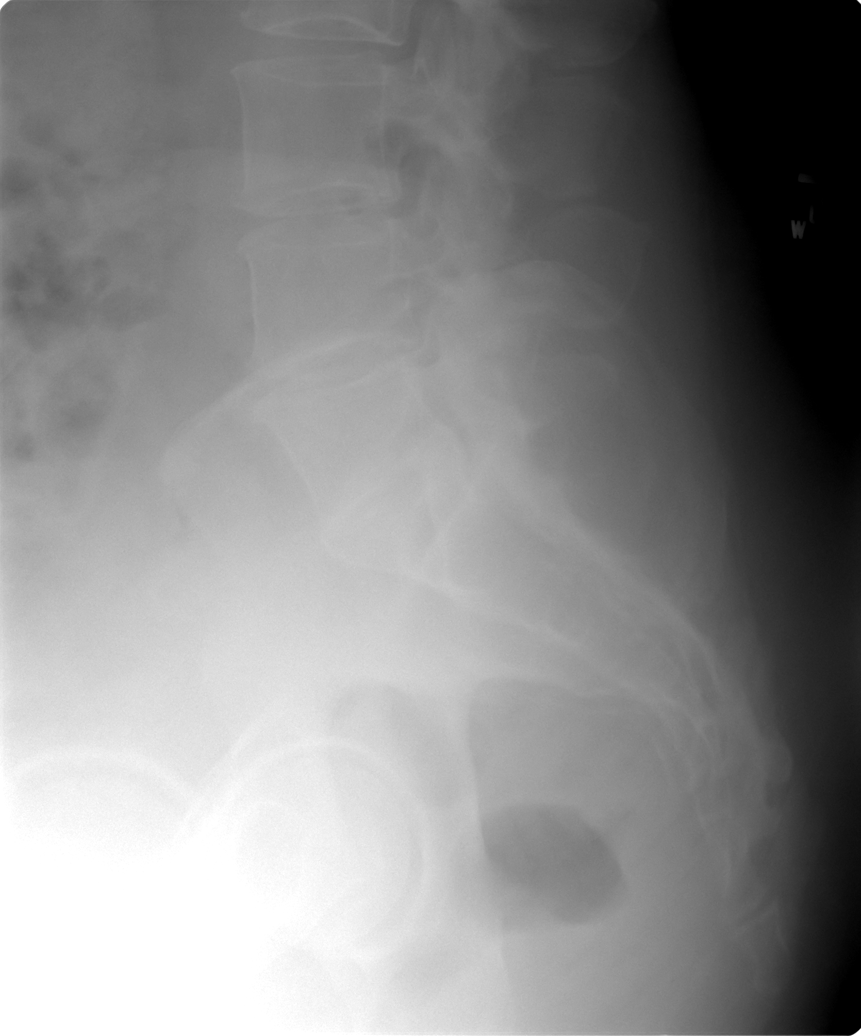

[5 of 5 positions shown; findings below may reference images not displayed]

FINDINGS: There are five non-rib bearing vertebral bodies.  There
is normal alignment.  There is no evidence for acute fracture or
subluxation.  No spondylolysis or spondylolisthesis identified.
There are mild degenerative changes in the lower thoracic levels,
T12-L1, and L3-L4.   The visualized portion of the pelvis has a
normal appearance.  Visualized bowel gas pattern is nonobstructive.
IMPRESSION: 1.  Mild degenerative changes.
2. No evidence for acute  abnormality.

## 2013-09-15 ENCOUNTER — Ambulatory Visit: Payer: Self-pay | Admitting: Family Medicine

## 2013-09-16 ENCOUNTER — Encounter: Payer: Self-pay | Admitting: Family Medicine

## 2013-09-17 ENCOUNTER — Encounter: Payer: Self-pay | Admitting: *Deleted

## 2013-10-12 ENCOUNTER — Other Ambulatory Visit (INDEPENDENT_AMBULATORY_CARE_PROVIDER_SITE_OTHER): Payer: No Typology Code available for payment source

## 2013-10-12 DIAGNOSIS — E785 Hyperlipidemia, unspecified: Secondary | ICD-10-CM

## 2013-10-12 LAB — LIPID PANEL
LDL Cholesterol: 60 mg/dL (ref 0–99)
Total CHOL/HDL Ratio: 3
VLDL: 21.2 mg/dL (ref 0.0–40.0)

## 2013-10-12 LAB — ALT: ALT: 17 U/L (ref 0–35)

## 2013-10-12 LAB — AST: AST: 18 U/L (ref 0–37)

## 2013-10-14 ENCOUNTER — Encounter: Payer: Self-pay | Admitting: *Deleted

## 2013-12-10 ENCOUNTER — Telehealth: Payer: Self-pay | Admitting: *Deleted

## 2013-12-10 NOTE — Telephone Encounter (Signed)
Received prior auth request for Relpax. Auth paperwork obtained and placed in your inbox.

## 2013-12-13 NOTE — Telephone Encounter (Signed)
I filled it out and did not have other formula agents tried - you may need to ask her as this may have been with the headache clinic in the past  In IN box

## 2013-12-20 MED ORDER — ZOLMITRIPTAN 5 MG PO TABS: 5.0000 mg | ORAL_TABLET | ORAL | Status: DC | PRN

## 2013-12-20 NOTE — Telephone Encounter (Signed)
Left voicemail letting pt know Rx sent and to let us know if she has any side eff or problems with Rx

## 2013-12-20 NOTE — Telephone Encounter (Signed)
Pt left v/m; pt received letter denying Relpax; in the past pt has tried Zomeg in nasal spray but pt is willing to try Zomeg in pill form. Pt will try whatever Dr Glori Bickers prescribes for migraine h/as.

## 2013-12-20 NOTE — Telephone Encounter (Signed)
Patient states Zomig (generic) was listed on the letter that she received as being covered.  She would like to try that.

## 2013-12-20 NOTE — Telephone Encounter (Signed)
I sent that to her pharmacy- if any problems or side effects please let me know

## 2013-12-20 NOTE — Telephone Encounter (Signed)
Have her find out if zomig is covered and I will px the pill form (other options are imitrex and maxalt and a few others)

## 2013-12-28 ENCOUNTER — Ambulatory Visit (INDEPENDENT_AMBULATORY_CARE_PROVIDER_SITE_OTHER): Payer: No Typology Code available for payment source | Admitting: Family Medicine

## 2013-12-28 ENCOUNTER — Encounter: Payer: Self-pay | Admitting: Family Medicine

## 2013-12-28 VITALS — BP 130/72 | HR 80 | Temp 98.2°F | Ht 66.75 in | Wt 215.0 lb

## 2013-12-28 DIAGNOSIS — J069 Acute upper respiratory infection, unspecified: Secondary | ICD-10-CM | POA: Insufficient documentation

## 2013-12-28 DIAGNOSIS — B9789 Other viral agents as the cause of diseases classified elsewhere: Principal | ICD-10-CM

## 2013-12-28 MED ORDER — HYDROCOD POLST-CHLORPHEN POLST 10-8 MG/5ML PO LQCR: 5.0000 mL | Freq: Two times a day (BID) | ORAL | Status: DC | PRN

## 2013-12-28 NOTE — Progress Notes (Signed)
Pre-visit discussion using our clinic review tool. No additional management support is needed unless otherwise documented below in the visit note.  

## 2013-12-28 NOTE — Patient Instructions (Signed)
Drink lots of fluids  Continue mucinex DM for day and tussionex at night - with caution Get as much rest as you can  Update if not starting to improve in a week or if worsening

## 2013-12-28 NOTE — Progress Notes (Signed)
Subjective:    Patient ID: Pamela Anthony, female    DOB: September 03, 1956, 58 y.o.   MRN: [id number]  HPI Here with uri symptoms Started sat with a cough  No fever   Congested/ nasal - but mostly chest Scratchy throat and itchy ears   No wheezing   Non prod cough - - a  Little bit of rattling   mucinex DM otc- then took some left over tussionex   Patient Active Problem List   Diagnosis Date Noted  . Encounter for routine gynecological examination 09/01/2013  . Depression with anxiety 01/18/2013  . Post-menopausal 08/14/2012  . Other screening mammogram 04/29/2011  . Routine general medical examination at a health care facility 04/22/2011  . ARTHRALGIA 02/28/2010  . VITAMIN D DEFICIENCY 03/08/2009  . IRRITABLE BOWEL SYNDROME 06/27/2008  . B12 DEFICIENCY 02/23/2008  . POLYP, COLON 03/06/2007  . HYPERLIPIDEMIA 03/06/2007  . ALLERGIC RHINITIS 03/06/2007  . GERD 03/06/2007  . HIATAL HERNIA 03/06/2007  . LOW BACK PAIN 03/06/2007  . SLEEP APNEA 03/06/2007  . URINARY INCONTINENCE 03/06/2007  . MIGRAINES, HX OF 03/06/2007   Past Medical History  Diagnosis Date  . Allergic rhinitis   . Iron deficiency anemia   . GERD (gastroesophageal reflux disease)     EGD 10/2004  . Hyperlipemia   . Low back pain   . Urinary incontinence   . Obesity   . B12 deficiency   . Vitamin D deficiency     mild  . Degenerative disc disease     back  . Fungal infection 2005    lamasil   . Hemorrhoids, internal     colonoscopy 02/2008   Past Surgical History  Procedure Laterality Date  . Cesarean section  1985  . Nasal sinus surgery  2000's    right maxillary  . Tonsillectomy and adenoidectomy    . Knee surgery  1970's    right  . Hand surgery  2000    nodule removed  . Dilation and curettage of uterus  2005   History  Substance Use Topics  . Smoking status: Never Smoker   . Smokeless tobacco: Never Used  . Alcohol Use: Yes     Comment: occasional   Family History  Problem  Relation Age of Onset  . Coronary artery disease Father     in his 30's  . Arthritis Father   . Stroke Father   . Heart disease Father     pacemaker   Allergies  Allergen Reactions  . Atorvastatin     REACTION: myalgias  . Oxybutynin     REACTION: rash   Current Outpatient Prescriptions on File Prior to Visit  Medication Sig Dispense Refill  . Calcium Carbonate-Vitamin D (CALTRATE 600+D) 600-400 MG-UNIT per tablet Take 1 tablet by mouth daily.        . cyclobenzaprine (FLEXERIL) 10 MG tablet Take 1 tablet (10 mg total) by mouth at bedtime as needed. For muscle spasm and pain  30 tablet  0  . FLUoxetine (PROZAC) 20 MG tablet Take 2 tablets (40 mg total) by mouth daily.  60 tablet  11  . hydrochlorothiazide (HYDRODIURIL) 25 MG tablet TAKE ONE-HALF TO ONE TABLET BY MOUTH EVERY DAY AS NEEDED  30 tablet  4  . omeprazole (PRILOSEC) 20 MG capsule TAKE ONE CAPSULE BY MOUTH EVERY DAY  90 capsule  3  . rosuvastatin (CRESTOR) 10 MG tablet Take 1 tablet (10 mg total) by mouth daily.  30 tablet  11  .  topiramate (TOPAMAX) 100 MG tablet Take 1 tablet (100 mg total) by mouth daily.  30 tablet  11  . VESICARE 10 MG tablet TAKE ONE TABLET BY MOUTH EVERY DAY  90 tablet  3  . zolmitriptan (ZOMIG) 5 MG tablet Take 1 tablet (5 mg total) by mouth as needed for migraine.  10 tablet  11  . [DISCONTINUED] solifenacin (VESICARE) 10 MG tablet Take 1 tablet (10 mg total) by mouth daily.  90 tablet  3   No current facility-administered medications on file prior to visit.      Review of Systems Review of Systems  Constitutional: Negative for fever, appetite change,  and unexpected weight change.  ENT pos for congestion / rhinorrhea and st , neg for facial pain  Eyes: Negative for pain and visual disturbance.  Respiratory: Negative for wheeze  and shortness of breath.   Cardiovascular: Negative for cp or palpitations    Gastrointestinal: Negative for nausea, diarrhea and constipation.  Genitourinary:  Negative for urgency and frequency.  Skin: Negative for pallor or rash   Neurological: Negative for weakness, light-headedness, numbness and headaches.  Hematological: Negative for adenopathy. Does not bruise/bleed easily.  Psychiatric/Behavioral: Negative for dysphoric mood. The patient is not nervous/anxious.         Objective:   Physical Exam  Constitutional: She appears well-developed and well-nourished. No distress.  obese and well appearing   HENT:  Head: Normocephalic and atraumatic.  Right Ear: External ear normal.  Left Ear: External ear normal.  Mouth/Throat: Oropharynx is clear and moist. No oropharyngeal exudate.  Nares are injected and congested  Clear rhinorrhea No facial tenderness Throat clear  Eyes: Conjunctivae and EOM are normal. Pupils are equal, round, and reactive to light. Right eye exhibits no discharge. Left eye exhibits no discharge.  Neck: Normal range of motion. Neck supple.  Cardiovascular: Normal rate and regular rhythm.   Pulmonary/Chest: Effort normal and breath sounds normal. No respiratory distress. She has no wheezes. She has no rales. She exhibits no tenderness.  Diffuse rhonchi without rales or wheeze   Lymphadenopathy:    She has no cervical adenopathy.  Neurological: She is alert.  Skin: Skin is warm and dry. No rash noted. No erythema.  Psychiatric: She has a normal mood and affect.          Assessment & Plan:

## 2013-12-28 NOTE — Assessment & Plan Note (Signed)
Disc symptomatic care - see instructions on AVS  Reassuring exam  tussionex prn pm - with caution Update if not starting to improve in a week or if worsening  -esp if wheeze or sob

## 2014-02-11 ENCOUNTER — Other Ambulatory Visit: Payer: Self-pay | Admitting: Family Medicine

## 2014-02-14 NOTE — Telephone Encounter (Signed)
done

## 2014-02-14 NOTE — Telephone Encounter (Signed)
Electronic refill request, please advise  

## 2014-02-14 NOTE — Telephone Encounter (Signed)
Please refill for a year  

## 2014-02-26 ENCOUNTER — Other Ambulatory Visit: Payer: Self-pay | Admitting: Family Medicine

## 2014-04-04 ENCOUNTER — Encounter: Payer: Self-pay | Admitting: Family Medicine

## 2014-04-04 ENCOUNTER — Ambulatory Visit (INDEPENDENT_AMBULATORY_CARE_PROVIDER_SITE_OTHER): Payer: No Typology Code available for payment source | Admitting: Family Medicine

## 2014-04-04 VITALS — BP 116/62 | HR 80 | Temp 98.1°F | Ht 66.75 in | Wt 214.5 lb

## 2014-04-04 DIAGNOSIS — J209 Acute bronchitis, unspecified: Secondary | ICD-10-CM | POA: Insufficient documentation

## 2014-04-04 MED ORDER — AZITHROMYCIN 250 MG PO TABS
ORAL_TABLET | ORAL | Status: DC
Start: 2014-04-04 — End: 2014-07-04

## 2014-04-04 MED ORDER — HYDROCOD POLST-CHLORPHEN POLST 10-8 MG/5ML PO LQCR: 5.0000 mL | Freq: Two times a day (BID) | ORAL | Status: DC | PRN

## 2014-04-04 NOTE — Assessment & Plan Note (Signed)
With likely early sinusitis that started as uri with cough Cover with zithromax  tussionex for cough with caution for sedation  Zyrtec otc for nasal symptoms also  Disc symptomatic care - see instructions on AVS  Update if not starting to improve in a week or if worsening

## 2014-04-04 NOTE — Patient Instructions (Signed)
Drink lots of fluids  Try zyrtec 10 mg over the counter daily for nasal symptoms Take the zpak as directed Use tussionex with caution - for severe cough- it will sedate  mucinex DM is ok during the day  Update if not starting to improve in a week or if worsening

## 2014-04-04 NOTE — Progress Notes (Signed)
Subjective:    Patient ID: Pamela Anthony, female    DOB: 1956-04-18, 59 y.o.   MRN: [id number]  HPI Here with uri symptoms  Started getting sick on 4/ 23 -- and she had been exposed to her parents who were sick  They had to have abx but ? Dx   Having head and chest symptoms  Congested and runny nose  Bad post nasal drip that makes her nauseated Ears itch  Cough is deep and bronchial- just a little productive  No fever  No wheeze   Taking mucinex DM and ibuprofen   Patient Active Problem List   Diagnosis Date Noted  . Viral URI with cough 12/28/2013  . Encounter for routine gynecological examination 09/01/2013  . Depression with anxiety 01/18/2013  . Post-menopausal 08/14/2012  . Other screening mammogram 04/29/2011  . Routine general medical examination at a health care facility 04/22/2011  . ARTHRALGIA 02/28/2010  . VITAMIN D DEFICIENCY 03/08/2009  . IRRITABLE BOWEL SYNDROME 06/27/2008  . B12 DEFICIENCY 02/23/2008  . POLYP, COLON 03/06/2007  . HYPERLIPIDEMIA 03/06/2007  . ALLERGIC RHINITIS 03/06/2007  . GERD 03/06/2007  . HIATAL HERNIA 03/06/2007  . LOW BACK PAIN 03/06/2007  . SLEEP APNEA 03/06/2007  . URINARY INCONTINENCE 03/06/2007  . MIGRAINES, HX OF 03/06/2007   Past Medical History  Diagnosis Date  . Allergic rhinitis   . Iron deficiency anemia   . GERD (gastroesophageal reflux disease)     EGD 10/2004  . Hyperlipemia   . Low back pain   . Urinary incontinence   . Obesity   . B12 deficiency   . Vitamin D deficiency     mild  . Degenerative disc disease     back  . Fungal infection 2005    lamasil   . Hemorrhoids, internal     colonoscopy 02/2008   Past Surgical History  Procedure Laterality Date  . Cesarean section  1985  . Nasal sinus surgery  2000's    right maxillary  . Tonsillectomy and adenoidectomy    . Knee surgery  1970's    right  . Hand surgery  2000    nodule removed  . Dilation and curettage of uterus  2005   History    Substance Use Topics  . Smoking status: Never Smoker   . Smokeless tobacco: Never Used  . Alcohol Use: Yes     Comment: occasional   Family History  Problem Relation Age of Onset  . Coronary artery disease Father     in his 84's  . Arthritis Father   . Stroke Father   . Heart disease Father     pacemaker   Allergies  Allergen Reactions  . Atorvastatin     REACTION: myalgias  . Oxybutynin     REACTION: rash   Current Outpatient Prescriptions on File Prior to Visit  Medication Sig Dispense Refill  . Calcium Carbonate-Vitamin D (CALTRATE 600+D) 600-400 MG-UNIT per tablet Take 1 tablet by mouth daily.        . chlorpheniramine-HYDROcodone (TUSSIONEX PENNKINETIC ER) 10-8 MG/5ML LQCR Take 5 mLs by mouth every 12 (twelve) hours as needed for cough.  140 mL  0  . Cholecalciferol (VITAMIN D-3 PO) Take 1 tablet by mouth daily.      . cyclobenzaprine (FLEXERIL) 10 MG tablet Take 1 tablet (10 mg total) by mouth at bedtime as needed. For muscle spasm and pain  30 tablet  0  . FLUoxetine (PROZAC) 20 MG tablet TAKE TWO TABLETS  BY MOUTH EVERY DAY  60 tablet  11  . hydrochlorothiazide (HYDRODIURIL) 25 MG tablet TAKE ONE-HALF TO ONE TABLET BY MOUTH ONCE DAILY AS NEEDED  30 tablet  1  . omeprazole (PRILOSEC) 20 MG capsule TAKE ONE CAPSULE BY MOUTH EVERY DAY  90 capsule  3  . rosuvastatin (CRESTOR) 10 MG tablet Take 1 tablet (10 mg total) by mouth daily.  30 tablet  11  . topiramate (TOPAMAX) 100 MG tablet Take 1 tablet (100 mg total) by mouth daily.  30 tablet  11  . VESICARE 10 MG tablet TAKE ONE TABLET BY MOUTH EVERY DAY  90 tablet  3  . zolmitriptan (ZOMIG) 5 MG tablet Take 1 tablet (5 mg total) by mouth as needed for migraine.  10 tablet  11  . [DISCONTINUED] solifenacin (VESICARE) 10 MG tablet Take 1 tablet (10 mg total) by mouth daily.  90 tablet  3   No current facility-administered medications on file prior to visit.      Review of Systems    Review of Systems  Constitutional:  Negative for fever, appetite change,  and unexpected weight change.  ENt pos for congestion /rhinorrhea/sinus pain and st/drainage  Eyes: Negative for pain and visual disturbance.  Respiratory: Negative for  shortness of breath.   Cardiovascular: Negative for cp or palpitations    Gastrointestinal: Negative for nausea, diarrhea and constipation.  Genitourinary: Negative for urgency and frequency.  Skin: Negative for pallor or rash   Neurological: Negative for weakness, light-headedness, numbness and headaches.  Hematological: Negative for adenopathy. Does not bruise/bleed easily.  Psychiatric/Behavioral: Negative for dysphoric mood. The patient is not nervous/anxious.      Objective:   Physical Exam  Constitutional: She appears well-developed and well-nourished. No distress.  HENT:  Head: Normocephalic and atraumatic.  Right Ear: External ear normal.  Left Ear: External ear normal.  Mouth/Throat: Oropharynx is clear and moist. No oropharyngeal exudate.  Nares are injected and congested  Bilateral frontal and maxillary sinus tenderness     Eyes: Conjunctivae and EOM are normal. Pupils are equal, round, and reactive to light. Right eye exhibits no discharge. Left eye exhibits no discharge.  Neck: Normal range of motion. Neck supple.  Cardiovascular: Normal rate and regular rhythm.   Pulmonary/Chest: Effort normal and breath sounds normal. No respiratory distress. She has no wheezes. She has no rales.  Harsh cough/ bronchial in nature No wheeze or rales or rhonchi   Lymphadenopathy:    She has no cervical adenopathy.  Neurological: She is alert.  Skin: Skin is warm and dry. No rash noted.  Psychiatric: She has a normal mood and affect.          Assessment & Plan:

## 2014-04-04 NOTE — Progress Notes (Signed)
Pre visit review using our clinic review tool, if applicable. No additional management support is needed unless otherwise documented below in the visit note. 

## 2014-04-13 ENCOUNTER — Telehealth: Payer: Self-pay

## 2014-04-13 DIAGNOSIS — J209 Acute bronchitis, unspecified: Secondary | ICD-10-CM

## 2014-04-13 NOTE — Addendum Note (Signed)
Addended by: Loura Pardon A on: 04/13/2014 07:19 PM   Modules accepted: Orders

## 2014-04-13 NOTE — Telephone Encounter (Signed)
Pt seen 04/04/14;finished antibiotic on 04/08/14; pt is slightly better but still non prod cough, head and chest congestion,no wheezing but feels like runs out of air while talking, No CP or fever. Pt wanted to know if could get refill on antibiotic or what to do? [facility] [address]. Pt request cb.

## 2014-04-13 NOTE — Telephone Encounter (Signed)
Please have her come in for a cxr when able -just the xray - I will advise when I get a report -thanks

## 2014-04-13 NOTE — Telephone Encounter (Signed)
Putting order in now for tomorrow's cxr

## 2014-04-13 NOTE — Telephone Encounter (Signed)
Pamela Anthony notified as instructed by telephone.  She will come to the office in the morning between 9-12 to get the x-ray.

## 2014-04-14 ENCOUNTER — Ambulatory Visit (INDEPENDENT_AMBULATORY_CARE_PROVIDER_SITE_OTHER)
Admission: RE | Admit: 2014-04-14 | Discharge: 2014-04-14 | Disposition: A | Discharge status: Home / Self Care | Payer: No Typology Code available for payment source | Source: Ambulatory Visit | Attending: Family Medicine | Admitting: Family Medicine

## 2014-04-14 DIAGNOSIS — J209 Acute bronchitis, unspecified: Secondary | ICD-10-CM

## 2014-06-28 ENCOUNTER — Other Ambulatory Visit: Payer: Self-pay | Admitting: Family Medicine

## 2014-07-04 ENCOUNTER — Ambulatory Visit (INDEPENDENT_AMBULATORY_CARE_PROVIDER_SITE_OTHER): Payer: No Typology Code available for payment source | Admitting: Family Medicine

## 2014-07-04 ENCOUNTER — Encounter: Payer: Self-pay | Admitting: Family Medicine

## 2014-07-04 VITALS — BP 110/78 | HR 89 | Temp 98.6°F | Ht 66.75 in | Wt 214.5 lb

## 2014-07-04 DIAGNOSIS — M542 Cervicalgia: Secondary | ICD-10-CM | POA: Insufficient documentation

## 2014-07-04 MED ORDER — CYCLOBENZAPRINE HCL 10 MG PO TABS: 5.0000 mg | ORAL_TABLET | Freq: Three times a day (TID) | ORAL | Status: DC | PRN

## 2014-07-04 MED ORDER — MELOXICAM 15 MG PO TABS: 15.0000 mg | ORAL_TABLET | Freq: Every day | ORAL | Status: DC | PRN

## 2014-07-04 NOTE — Progress Notes (Signed)
Pre visit review using our clinic review tool, if applicable. No additional management support is needed unless otherwise documented below in the visit note. 

## 2014-07-04 NOTE — Progress Notes (Signed)
Subjective:    Patient ID: Pamela Anthony, female    DOB: 1956/07/22, 58 y.o.   MRN: [id number]  HPI Here with neck pain  At the base of her skull - discomfort  Spasm when she turns her head / sore and tight at the same time Had a headache on and off for the past several week   Makes her uneasy and anxious   She can even feel a pulling sensation when she chews   Pillow - medium fill pillow (does not like memory foam)- just one pillow  Neck is not too stiff in the am (is always tight) Holds tension in her neck and shoulders  Stress level not as bad right now    Ortho looked at her neck in the past  6/10 xray CS - deg spondylosis , with kyphotic angulation (from spasm)  Patient Active Problem List   Diagnosis Date Noted  . Acute bronchitis 04/04/2014  . Viral URI with cough 12/28/2013  . Encounter for routine gynecological examination 09/01/2013  . Depression with anxiety 01/18/2013  . Post-menopausal 08/14/2012  . Other screening mammogram 04/29/2011  . Routine general medical examination at a health care facility 04/22/2011  . ARTHRALGIA 02/28/2010  . VITAMIN D DEFICIENCY 03/08/2009  . IRRITABLE BOWEL SYNDROME 06/27/2008  . B12 DEFICIENCY 02/23/2008  . POLYP, COLON 03/06/2007  . HYPERLIPIDEMIA 03/06/2007  . ALLERGIC RHINITIS 03/06/2007  . GERD 03/06/2007  . HIATAL HERNIA 03/06/2007  . LOW BACK PAIN 03/06/2007  . SLEEP APNEA 03/06/2007  . URINARY INCONTINENCE 03/06/2007  . MIGRAINES, HX OF 03/06/2007   Past Medical History  Diagnosis Date  . Allergic rhinitis   . Iron deficiency anemia   . GERD (gastroesophageal reflux disease)     EGD 10/2004  . Hyperlipemia   . Low back pain   . Urinary incontinence   . Obesity   . B12 deficiency   . Vitamin D deficiency     mild  . Degenerative disc disease     back  . Fungal infection 2005    lamasil   . Hemorrhoids, internal     colonoscopy 02/2008   Past Surgical History  Procedure Laterality Date  . Cesarean  section  1985  . Nasal sinus surgery  2000's    right maxillary  . Tonsillectomy and adenoidectomy    . Knee surgery  1970's    right  . Hand surgery  2000    nodule removed  . Dilation and curettage of uterus  2005   History  Substance Use Topics  . Smoking status: Never Smoker   . Smokeless tobacco: Never Used  . Alcohol Use: Yes     Comment: occasional   Family History  Problem Relation Age of Onset  . Coronary artery disease Father     in his 56's  . Arthritis Father   . Stroke Father   . Heart disease Father     pacemaker   Allergies  Allergen Reactions  . Atorvastatin     REACTION: myalgias  . Oxybutynin     REACTION: rash   Current Outpatient Prescriptions on File Prior to Visit  Medication Sig Dispense Refill  . Calcium Carbonate-Vitamin D (CALTRATE 600+D) 600-400 MG-UNIT per tablet Take 1 tablet by mouth daily.        . Cholecalciferol (VITAMIN D-3 PO) Take 1 tablet by mouth daily.      . cyclobenzaprine (FLEXERIL) 10 MG tablet Take 1 tablet (10 mg total) by mouth at bedtime  as needed. For muscle spasm and pain  30 tablet  0  . FLUoxetine (PROZAC) 20 MG tablet TAKE TWO TABLETS BY MOUTH EVERY DAY  60 tablet  11  . hydrochlorothiazide (HYDRODIURIL) 25 MG tablet TAKE ONE-HALF TO ONE TABLET BY MOUTH ONCE DAILY AS NEEDED  30 tablet  2  . omeprazole (PRILOSEC) 20 MG capsule TAKE ONE CAPSULE BY MOUTH EVERY DAY  90 capsule  3  . rosuvastatin (CRESTOR) 10 MG tablet Take 1 tablet (10 mg total) by mouth daily.  30 tablet  11  . topiramate (TOPAMAX) 100 MG tablet Take 1 tablet (100 mg total) by mouth daily.  30 tablet  11  . VESICARE 10 MG tablet TAKE ONE TABLET BY MOUTH EVERY DAY  90 tablet  3  . zolmitriptan (ZOMIG) 5 MG tablet Take 1 tablet (5 mg total) by mouth as needed for migraine.  10 tablet  11   No current facility-administered medications on file prior to visit.      Review of Systems Review of Systems  Constitutional: Negative for fever, appetite change,  fatigue and unexpected weight change.  Eyes: Negative for pain and visual disturbance.  Respiratory: Negative for cough and shortness of breath.   Cardiovascular: Negative for cp or palpitations    Gastrointestinal: Negative for nausea, diarrhea and constipation.  Genitourinary: Negative for urgency and frequency.  Skin: Negative for pallor or  MSK pos for neck pain and spasm  Neurological: Negative for weakness, light-headedness, numbness and pos for headaches.  Hematological: Negative for adenopathy. Does not bruise/bleed easily.  Psychiatric/Behavioral: Negative for dysphoric mood. The patient is not nervous/anxious.         Objective:   Physical Exam  Constitutional: She appears well-developed and well-nourished. No distress.  HENT:  Head: Normocephalic and atraumatic.  Eyes: Conjunctivae and EOM are normal. Pupils are equal, round, and reactive to light.  Neck: Normal range of motion. Neck supple.  Cardiovascular: Normal rate and regular rhythm.   Pulmonary/Chest: Effort normal and breath sounds normal.  Musculoskeletal: She exhibits tenderness. She exhibits no edema.  Neck is tender in R paracervical musculature , no bony tenderness  Pain to fully flex and rotate to the right  Also to tilt to the right     Lymphadenopathy:    She has no cervical adenopathy.  Neurological: She is alert. She has normal reflexes. She displays no atrophy. No cranial nerve deficit or sensory deficit. She exhibits normal muscle tone. Coordination and gait normal.  Skin: Skin is warm and dry. No rash noted. No erythema.  Psychiatric: She has a normal mood and affect.          Assessment & Plan:   Problem List Items Addressed This Visit     Other   Neck pain - Primary     Spasm/ recurrent (hx of spondylosis also)  No neuro changes  Disc stretching/use of heat/pillow  Flexeril 1/2 up to tid as tol and meloxicam for spasm if needed  Update if not starting to improve in a week or if  worsening

## 2014-07-04 NOTE — Patient Instructions (Signed)
Try heat on your neck - I think you have spasm  Also gentle stretching  Flexeril 1/2 pill up to three times daily (watch out for sedation)  For pain and headache -meloxicam with food  Update if not starting to improve in a week or if worsening

## 2014-07-04 NOTE — Assessment & Plan Note (Signed)
Spasm/ recurrent (hx of spondylosis also)  No neuro changes  Disc stretching/use of heat/pillow  Flexeril 1/2 up to tid as tol and meloxicam for spasm if needed  Update if not starting to improve in a week or if worsening

## 2014-07-13 ENCOUNTER — Telehealth: Payer: Self-pay

## 2014-07-13 DIAGNOSIS — M542 Cervicalgia: Secondary | ICD-10-CM

## 2014-07-13 NOTE — Telephone Encounter (Signed)
Pt left v/m; pt was seen on 07/04/14, pt has been taking antiinflammatory and muscle relaxant for neck pain without improvement. Pt request cb with next step.

## 2014-07-13 NOTE — Telephone Encounter (Signed)
If no improvement I want to get her back to orthopedics  Is there someone specifically she wants to see?

## 2014-07-14 ENCOUNTER — Telehealth: Payer: Self-pay | Admitting: Family Medicine

## 2014-07-14 NOTE — Telephone Encounter (Signed)
Returned your call, please call her back. Thank you.

## 2014-07-14 NOTE — Telephone Encounter (Signed)
Referral done

## 2014-07-14 NOTE — Telephone Encounter (Signed)
Grizel notified as instructed by telephone.  She states she would like to see Dr. Mack Guise at [facility].

## 2014-07-14 NOTE — Telephone Encounter (Signed)
Returned Kierstan's call.  See previous phone note.

## 2014-08-25 ENCOUNTER — Telehealth: Payer: Self-pay | Admitting: Family Medicine

## 2014-08-25 DIAGNOSIS — E559 Vitamin D deficiency, unspecified: Secondary | ICD-10-CM

## 2014-08-25 DIAGNOSIS — E538 Deficiency of other specified B group vitamins: Secondary | ICD-10-CM

## 2014-08-25 DIAGNOSIS — Z0000 Encounter for general adult medical examination without abnormal findings: Secondary | ICD-10-CM

## 2014-08-25 NOTE — Telephone Encounter (Signed)
Message copied by Abner Greenspan on Thu Aug 25, 2014  5:08 PM ------      Message from: Ellamae Sia      Created: Wed Aug 24, 2014  5:55 PM      Regarding: Lab orders for Friday, 9.18.15       Patient is scheduled for CPX labs, please order future labs, Thanks , Terri       ------

## 2014-08-26 ENCOUNTER — Other Ambulatory Visit: Payer: Self-pay | Admitting: Family Medicine

## 2014-08-26 ENCOUNTER — Other Ambulatory Visit (INDEPENDENT_AMBULATORY_CARE_PROVIDER_SITE_OTHER): Payer: No Typology Code available for payment source

## 2014-08-26 ENCOUNTER — Telehealth: Payer: Self-pay | Admitting: Family Medicine

## 2014-08-26 DIAGNOSIS — Z0000 Encounter for general adult medical examination without abnormal findings: Secondary | ICD-10-CM

## 2014-08-26 DIAGNOSIS — E559 Vitamin D deficiency, unspecified: Secondary | ICD-10-CM

## 2014-08-26 DIAGNOSIS — E538 Deficiency of other specified B group vitamins: Secondary | ICD-10-CM

## 2014-08-26 LAB — VITAMIN D 25 HYDROXY (VIT D DEFICIENCY, FRACTURES): VITD: 51.13 ng/mL (ref 30.00–100.00)

## 2014-08-26 LAB — LIPID PANEL
CHOLESTEROL: 117 mg/dL (ref 0–200)
HDL: 37.4 mg/dL — ABNORMAL LOW (ref >39.00)
LDL Cholesterol: 63 mg/dL (ref 0–99)
NonHDL: 79.6
Total CHOL/HDL Ratio: 3
Triglycerides: 82 mg/dL (ref 0.0–149.0)
VLDL: 16.4 mg/dL (ref 0.0–40.0)

## 2014-08-26 LAB — COMPREHENSIVE METABOLIC PANEL
ALT: 18 U/L (ref 0–35)
AST: 17 U/L (ref 0–37)
Albumin: 4.3 g/dL (ref 3.5–5.2)
Alkaline Phosphatase: 68 U/L (ref 39–117)
BUN: 16 mg/dL (ref 6–23)
CO2: 24 meq/L (ref 19–32)
Calcium: 8.8 mg/dL (ref 8.4–10.5)
Chloride: 107 mEq/L (ref 96–112)
Creatinine, Ser: 0.8 mg/dL (ref 0.4–1.2)
GFR: 84.51 mL/min (ref >60.00)
Glucose, Bld: 106 mg/dL — ABNORMAL HIGH (ref 70–99)
POTASSIUM: 3.4 meq/L — AB (ref 3.5–5.1)
SODIUM: 141 meq/L (ref 135–145)
TOTAL PROTEIN: 7.4 g/dL (ref 6.0–8.3)
Total Bilirubin: 0.6 mg/dL (ref 0.2–1.2)

## 2014-08-26 LAB — CBC WITH DIFFERENTIAL/PLATELET
Basophils Absolute: 0 10*3/uL (ref 0.0–0.1)
Basophils Relative: 0.6 % (ref 0.0–3.0)
EOS PCT: 1.2 % (ref 0.0–5.0)
Eosinophils Absolute: 0.1 10*3/uL (ref 0.0–0.7)
HEMATOCRIT: 38.7 % (ref 36.0–46.0)
Hemoglobin: 12.9 g/dL (ref 12.0–15.0)
LYMPHS ABS: 1.1 10*3/uL (ref 0.7–4.0)
Lymphocytes Relative: 24.3 % (ref 12.0–46.0)
MCHC: 33.5 g/dL (ref 30.0–36.0)
MCV: 88.6 fl (ref 78.0–100.0)
MONOS PCT: 7 % (ref 3.0–12.0)
Monocytes Absolute: 0.3 10*3/uL (ref 0.1–1.0)
NEUTROS PCT: 66.9 % (ref 43.0–77.0)
Neutro Abs: 3.1 10*3/uL (ref 1.4–7.7)
PLATELETS: 228 10*3/uL (ref 150.0–400.0)
RBC: 4.36 Mil/uL (ref 3.87–5.11)
RDW: 14 % (ref 11.5–15.5)
WBC: 4.6 10*3/uL (ref 4.0–10.5)

## 2014-08-26 LAB — VITAMIN B12: VITAMIN B 12: 553 pg/mL (ref 211–911)

## 2014-08-26 LAB — TSH: TSH: 2.33 u[IU]/mL (ref 0.35–4.50)

## 2014-08-26 NOTE — Telephone Encounter (Signed)
Pt scheduled to get flu shot on 9/23   She wants to get her pneumonia and tetanus shot at same time Is this ok

## 2014-08-26 NOTE — Telephone Encounter (Signed)
Yes, that if fine with me

## 2014-08-29 ENCOUNTER — Other Ambulatory Visit: Payer: Self-pay | Admitting: Family Medicine

## 2014-08-29 MED ORDER — MELOXICAM 15 MG PO TABS: 15.0000 mg | ORAL_TABLET | Freq: Every day | ORAL | Status: DC | PRN

## 2014-08-29 NOTE — Telephone Encounter (Signed)
done

## 2014-08-29 NOTE — Telephone Encounter (Signed)
appt scheduled and left voicemail letting pt know she can get all 3 vaccines

## 2014-08-29 NOTE — Telephone Encounter (Signed)
Please refill times 5 

## 2014-08-31 ENCOUNTER — Ambulatory Visit (INDEPENDENT_AMBULATORY_CARE_PROVIDER_SITE_OTHER): Payer: No Typology Code available for payment source | Admitting: *Deleted

## 2014-08-31 ENCOUNTER — Ambulatory Visit: Payer: No Typology Code available for payment source

## 2014-08-31 DIAGNOSIS — Z23 Encounter for immunization: Secondary | ICD-10-CM

## 2014-09-02 ENCOUNTER — Encounter: Payer: No Typology Code available for payment source | Admitting: Family Medicine

## 2014-09-10 ENCOUNTER — Other Ambulatory Visit: Payer: Self-pay | Admitting: Family Medicine

## 2014-09-21 ENCOUNTER — Telehealth: Payer: Self-pay | Admitting: Family Medicine

## 2014-09-21 NOTE — Telephone Encounter (Signed)
Pt called to see if it was ok for her to get shingles vaccine? She has checked with her insurance and they do cover at 100%.

## 2014-09-21 NOTE — Telephone Encounter (Signed)
After 10/23 is fine (I like to wait a mo after last imms) If covered here-make nurse appt  If in a pharmacy- let me know and I will send imm to the pharmacy she plans to go to

## 2014-09-22 NOTE — Telephone Encounter (Signed)
appt scheduled and pt will double check to make sure it's covered at the office and not at pharmacy

## 2014-09-27 ENCOUNTER — Other Ambulatory Visit: Payer: Self-pay | Admitting: Family Medicine

## 2014-09-27 NOTE — Telephone Encounter (Signed)
Please refill for 6 mo 

## 2014-09-27 NOTE — Telephone Encounter (Signed)
electronic refill request, please advise

## 2014-09-28 NOTE — Telephone Encounter (Signed)
done

## 2014-10-05 ENCOUNTER — Ambulatory Visit (INDEPENDENT_AMBULATORY_CARE_PROVIDER_SITE_OTHER): Payer: No Typology Code available for payment source

## 2014-10-05 DIAGNOSIS — Z23 Encounter for immunization: Secondary | ICD-10-CM

## 2014-10-13 ENCOUNTER — Other Ambulatory Visit: Payer: Self-pay | Admitting: *Deleted

## 2014-10-13 MED ORDER — CYCLOBENZAPRINE HCL 10 MG PO TABS: 5.0000 mg | ORAL_TABLET | Freq: Three times a day (TID) | ORAL | Status: DC | PRN

## 2014-10-13 NOTE — Telephone Encounter (Signed)
Fax refill request, please advise  

## 2014-10-13 NOTE — Telephone Encounter (Signed)
Will refill electronically  

## 2014-11-09 ENCOUNTER — Other Ambulatory Visit: Payer: Self-pay | Admitting: Family Medicine

## 2014-11-18 ENCOUNTER — Ambulatory Visit: Payer: Self-pay | Admitting: Family Medicine

## 2014-11-18 ENCOUNTER — Encounter: Payer: Self-pay | Admitting: Family Medicine

## 2015-01-16 ENCOUNTER — Encounter: Payer: Self-pay | Admitting: Family Medicine

## 2015-01-16 ENCOUNTER — Ambulatory Visit (INDEPENDENT_AMBULATORY_CARE_PROVIDER_SITE_OTHER): Payer: No Typology Code available for payment source | Admitting: Family Medicine

## 2015-01-16 VITALS — BP 114/80 | HR 81 | Temp 98.6°F | Ht 67.0 in | Wt 214.1 lb

## 2015-01-16 DIAGNOSIS — R400 Somnolence: Secondary | ICD-10-CM | POA: Insufficient documentation

## 2015-01-16 DIAGNOSIS — F430 Acute stress reaction: Secondary | ICD-10-CM | POA: Insufficient documentation

## 2015-01-16 DIAGNOSIS — R0683 Snoring: Secondary | ICD-10-CM | POA: Insufficient documentation

## 2015-01-16 NOTE — Progress Notes (Signed)
Pre visit review using our clinic review tool, if applicable. No additional management support is needed unless otherwise documented below in the visit note. 

## 2015-01-16 NOTE — Assessment & Plan Note (Signed)
Ongoing with day time somnolence  Suspect recurrent sleep apnea  Ref to sleep clinic for re eval

## 2015-01-16 NOTE — Assessment & Plan Note (Signed)
Reviewed stressors/ coping techniques/symptoms/ support sources/ tx options and side effects in detail today On SSRI Disc issues at home and son with ETOH ism  Enc counseling or alanon meetings  Enc self care

## 2015-01-16 NOTE — Patient Instructions (Signed)
Stop at check out for referral to the sleep clinic  Try to take care of yourself  If you become interested in counseling - let us know  You may also check into alanon meetings

## 2015-01-16 NOTE — Assessment & Plan Note (Signed)
This could be multifactorial  First concern is recurrence of sleep apnea  Ref to sleep clinic Disc risks of this  Also disc stressors and lack of self care

## 2015-01-16 NOTE — Progress Notes (Signed)
Subjective:    Patient ID: Pamela Anthony, female    DOB: 05-Mar-1956, 59 y.o.   MRN: [id number]  HPI Here with several issues   She is very very tired/ exhausted  She falls asleep easily - even after getting enough sleep   She was tested a long time ago for sleep apnea - did cpap for a while     She stopped taking crestor 8 days ago - to see if that was the cause   Still having head discomfort - sharp pain in her L temple at times   Dry mouth  Gets a frog in her throat- and she talks all day  Takes prilosec  Stressors- : husband has a lot of medical issues - neuropathy and bladder issues - worries about him , heart issues  Does not feel totally depressed- could use a "good cry" Son lives with her -is 78 and thinks he is an alcoholic  Not seeing a counselor  Is too tired to exercise   Results for orders placed or performed in visit on 08/26/14  CBC with Differential  Result Value Ref Range   WBC 4.6 4.0 - 10.5 K/uL   RBC 4.36 3.87 - 5.11 Mil/uL   Hemoglobin 12.9 12.0 - 15.0 g/dL   HCT 38.7 36.0 - 46.0 %   MCV 88.6 78.0 - 100.0 fl   MCHC 33.5 30.0 - 36.0 g/dL   RDW 14.0 11.5 - 15.5 %   Platelets 228.0 150.0 - 400.0 K/uL   Neutrophils Relative % 66.9 43.0 - 77.0 %   Lymphocytes Relative 24.3 12.0 - 46.0 %   Monocytes Relative 7.0 3.0 - 12.0 %   Eosinophils Relative 1.2 0.0 - 5.0 %   Basophils Relative 0.6 0.0 - 3.0 %   Neutro Abs 3.1 1.4 - 7.7 K/uL   Lymphs Abs 1.1 0.7 - 4.0 K/uL   Monocytes Absolute 0.3 0.1 - 1.0 K/uL   Eosinophils Absolute 0.1 0.0 - 0.7 K/uL   Basophils Absolute 0.0 0.0 - 0.1 K/uL  Comprehensive metabolic panel  Result Value Ref Range   Sodium 141 135 - 145 mEq/L   Potassium 3.4 (L) 3.5 - 5.1 mEq/L   Chloride 107 96 - 112 mEq/L   CO2 24 19 - 32 mEq/L   Glucose, Bld 106 (H) 70 - 99 mg/dL   BUN 16 6 - 23 mg/dL   Creatinine, Ser 0.8 0.4 - 1.2 mg/dL   Total Bilirubin 0.6 0.2 - 1.2 mg/dL   Alkaline Phosphatase 68 39 - 117 U/L   AST 17 0 - 37  U/L   ALT 18 0 - 35 U/L   Total Protein 7.4 6.0 - 8.3 g/dL   Albumin 4.3 3.5 - 5.2 g/dL   Calcium 8.8 8.4 - 10.5 mg/dL   GFR 84.51 >60.00 mL/min  Lipid panel  Result Value Ref Range   Cholesterol 117 0 - 200 mg/dL   Triglycerides 82.0 0.0 - 149.0 mg/dL   HDL 37.40 (L) >39.00 mg/dL   VLDL 16.4 0.0 - 40.0 mg/dL   LDL Cholesterol 63 0 - 99 mg/dL   Total CHOL/HDL Ratio 3    NonHDL 79.60   TSH  Result Value Ref Range   TSH 2.33 0.35 - 4.50 uIU/mL  Vit D  25 hydroxy (rtn osteoporosis monitoring)  Result Value Ref Range   VITD 51.13 30.00 - 100.00 ng/mL  Vitamin B12  Result Value Ref Range   Vitamin B-12 553 211 - 911 pg/mL  Patient Active Problem List   Diagnosis Date Noted  . Somnolence 01/16/2015  . Stress reaction 01/16/2015  . Snoring 01/16/2015  . Neck pain 07/04/2014  . Acute bronchitis 04/04/2014  . Viral URI with cough 12/28/2013  . Encounter for routine gynecological examination 09/01/2013  . Depression with anxiety 01/18/2013  . Post-menopausal 08/14/2012  . Other screening mammogram 04/29/2011  . Routine general medical examination at a health care facility 04/22/2011  . ARTHRALGIA 02/28/2010  . VITAMIN D DEFICIENCY 03/08/2009  . IRRITABLE BOWEL SYNDROME 06/27/2008  . B12 DEFICIENCY 02/23/2008  . POLYP, COLON 03/06/2007  . HYPERLIPIDEMIA 03/06/2007  . ALLERGIC RHINITIS 03/06/2007  . GERD 03/06/2007  . HIATAL HERNIA 03/06/2007  . LOW BACK PAIN 03/06/2007  . SLEEP APNEA 03/06/2007  . URINARY INCONTINENCE 03/06/2007  . MIGRAINES, HX OF 03/06/2007   Past Medical History  Diagnosis Date  . Allergic rhinitis   . Iron deficiency anemia   . GERD (gastroesophageal reflux disease)     EGD 10/2004  . Hyperlipemia   . Low back pain   . Urinary incontinence   . Obesity   . B12 deficiency   . Vitamin D deficiency     mild  . Degenerative disc disease     back  . Fungal infection 2005    lamasil   . Hemorrhoids, internal     colonoscopy 02/2008    Past Surgical History  Procedure Laterality Date  . Cesarean section  1985  . Nasal sinus surgery  2000's    right maxillary  . Tonsillectomy and adenoidectomy    . Knee surgery  1970's    right  . Hand surgery  2000    nodule removed  . Dilation and curettage of uterus  2005   History  Substance Use Topics  . Smoking status: Never Smoker   . Smokeless tobacco: Never Used  . Alcohol Use: Yes     Comment: occasional   Family History  Problem Relation Age of Onset  . Coronary artery disease Father     in his 69's  . Arthritis Father   . Stroke Father   . Heart disease Father     pacemaker   Allergies  Allergen Reactions  . Atorvastatin     REACTION: myalgias  . Oxybutynin     REACTION: rash   Current Outpatient Prescriptions on File Prior to Visit  Medication Sig Dispense Refill  . Calcium Carbonate-Vitamin D (CALTRATE 600+D) 600-400 MG-UNIT per tablet Take 1 tablet by mouth daily.      . Cholecalciferol (VITAMIN D-3 PO) Take 1 tablet by mouth daily.    . cyclobenzaprine (FLEXERIL) 10 MG tablet Take 0.5 tablets (5 mg total) by mouth 3 (three) times daily as needed. For muscle spasm and pain 30 tablet 2  . FLUoxetine (PROZAC) 20 MG tablet TAKE TWO TABLETS BY MOUTH EVERY DAY 60 tablet 11  . hydrochlorothiazide (HYDRODIURIL) 25 MG tablet TAKE ONE-HALF TO ONE TABLET BY MOUTH ONCE DAILY AS NEEDED 30 tablet 5  . meloxicam (MOBIC) 15 MG tablet Take 1 tablet (15 mg total) by mouth daily as needed for pain (with food). 30 tablet 4  . omeprazole (PRILOSEC) 20 MG capsule TAKE ONE CAPSULE BY MOUTH ONCE DAILY 90 capsule 1  . topiramate (TOPAMAX) 100 MG tablet TAKE ONE TABLET BY MOUTH ONCE DAILY 30 tablet 5  . VESICARE 10 MG tablet TAKE ONE TABLET BY MOUTH ONCE DAILY 90 tablet 1  . zolmitriptan (ZOMIG) 5 MG tablet Take 1  tablet (5 mg total) by mouth as needed for migraine. 10 tablet 11  . CRESTOR 10 MG tablet TAKE ONE TABLET BY MOUTH ONCE DAILY (Patient not taking: Reported on  01/16/2015) 30 tablet 5   No current facility-administered medications on file prior to visit.    Review of Systems Review of Systems  Constitutional: Negative for fever, appetite change,  and unexpected weight change.  Eyes: Negative for pain and visual disturbance.  Respiratory: Negative for cough and shortness of breath.   Cardiovascular: Negative for cp or palpitations    Gastrointestinal: Negative for nausea, diarrhea and constipation.  Genitourinary: Negative for urgency and frequency.  Skin: Negative for pallor or rash   Neurological: Negative for weakness, light-headedness, numbness and pos for  headaches.  Hematological: Negative for adenopathy. Does not bruise/bleed easily.  Psychiatric/Behavioral: pos  for dysphoric mood. The patient is not nervous/anxious.         Objective:   Physical Exam  Constitutional: She appears well-developed and well-nourished. No distress.  obese and well appearing   HENT:  Head: Normocephalic and atraumatic.  Right Ear: External ear normal.  Left Ear: External ear normal.  Mouth/Throat: Oropharynx is clear and moist.  Eyes: Conjunctivae and EOM are normal. Pupils are equal, round, and reactive to light. No scleral icterus.  Neck: Normal range of motion. Neck supple. No JVD present. Carotid bruit is not present. No thyromegaly present.  Cardiovascular: Normal rate, regular rhythm, normal heart sounds and intact distal pulses.  Exam reveals no gallop.   Pulmonary/Chest: Effort normal and breath sounds normal. No respiratory distress. She has no wheezes. She exhibits no tenderness.  Abdominal: Soft. Bowel sounds are normal. She exhibits no distension, no abdominal bruit and no mass. There is no tenderness.  Musculoskeletal: Normal range of motion. She exhibits no edema.  Lymphadenopathy:    She has no cervical adenopathy.  Neurological: She is alert. She has normal reflexes. No cranial nerve deficit. She exhibits normal muscle tone. Coordination  normal.  Skin: Skin is warm and dry. No rash noted. No erythema. No pallor.  Psychiatric: Her speech is normal and behavior is normal. Thought content normal. Her affect is blunt.  Affect is slightly blunted           Assessment & Plan:   Problem List Items Addressed This Visit      Other   Snoring    Ongoing with day time somnolence  Suspect recurrent sleep apnea  Ref to sleep clinic for re eval      Relevant Orders   Ambulatory referral to Pulmonology   Somnolence - Primary    This could be multifactorial  First concern is recurrence of sleep apnea  Ref to sleep clinic Disc risks of this  Also disc stressors and lack of self care       Relevant Orders   Ambulatory referral to Pulmonology   Stress reaction    Reviewed stressors/ coping techniques/symptoms/ support sources/ tx options and side effects in detail today On SSRI Disc issues at home and son with ETOH ism  Enc counseling or alanon meetings  Enc self care

## 2015-02-11 ENCOUNTER — Other Ambulatory Visit: Payer: Self-pay | Admitting: Family Medicine

## 2015-02-24 ENCOUNTER — Encounter: Payer: Self-pay | Admitting: Pulmonary Disease

## 2015-02-24 ENCOUNTER — Ambulatory Visit (INDEPENDENT_AMBULATORY_CARE_PROVIDER_SITE_OTHER): Payer: No Typology Code available for payment source | Admitting: Pulmonary Disease

## 2015-02-24 DIAGNOSIS — G4733 Obstructive sleep apnea (adult) (pediatric): Secondary | ICD-10-CM

## 2015-02-24 NOTE — Progress Notes (Signed)
Subjective:    Patient ID: Pamela Anthony, female    DOB: 05/01/1956, 59 y.o.   MRN: [id number]  HPI The patient is a 59 year old female who I've been asked to see for possible obstructive sleep apnea. She has been noted to have loud snoring by her bed partner, but has not had witnessed apneas. She does have frequent awakenings at night, and is not rested in the mornings upon arising. She notes significant sleep pressure during the day with inactivity, especially in the afternoons. She also will fall asleep very easily in the evenings watching television or movies. Finally, she does admit to having sleep pressure driving longer distances. She thinks that she had a study approximately 10 years ago, and is unsure whether she had sleep apnea at that time are not. She states that her weight is neutral over the last 2 years, and her Epworth score today is abnormal at 12   Sleep Questionnaire What time do you typically go to bed?( Between what hours) 10-11p 10-11p at 1542 on 02/24/15 by Inge Rise, CMA How long does it take you to fall asleep? 5 minutes 5 minutes at 1542 on 02/24/15 by Inge Rise, CMA How many times during the night do you wake up? 2 2 at 1542 on 02/24/15 by Inge Rise, CMA What time do you get out of bed to start your day? 0630 0630 at 1542 on 02/24/15 by Inge Rise, CMA Do you drive or operate heavy machinery in your occupation? No No at 1542 on 02/24/15 by Inge Rise, CMA How much has your weight changed (up or down) over the past two years? (In pounds) 10 lb (4.536 kg) 10 lb (4.536 kg) at 1542 on 02/24/15 by Inge Rise, CMA Have you ever had a sleep study before? Yes Yes at 1542 on 02/24/15 by Inge Rise, CMA If yes, location of study? [facility] at 5374 on 02/24/15 by Inge Rise, CMA If yes, date of study? over 10 years ago over 10 years ago at 1542 on 02/24/15 by Inge Rise, CMA Do you currently use CPAP? No No at 1542 on 02/24/15 by  Inge Rise, CMA Do you wear oxygen at any time? No No at 1542 on 02/24/15 by Inge Rise, CMA   Review of Systems  Constitutional: Negative for fever, chills and unexpected weight change.  HENT: Positive for congestion. Negative for dental problem, ear pain, nosebleeds, postnasal drip, rhinorrhea, sinus pressure, sneezing, sore throat, trouble swallowing and voice change.   Eyes: Negative for redness, itching and visual disturbance.  Respiratory: Negative for cough, choking, chest tightness, shortness of breath and wheezing.   Cardiovascular: Negative for chest pain, palpitations and leg swelling.  Gastrointestinal: Negative for nausea, vomiting, abdominal pain and diarrhea.  Genitourinary: Negative for dysuria and difficulty urinating.  Musculoskeletal: Negative for joint swelling and arthralgias.  Skin: Negative for rash.  Neurological: Positive for headaches. Negative for tremors and syncope.  Hematological: Does not bruise/bleed easily.  Psychiatric/Behavioral: Negative for dysphoric mood. The patient is not nervous/anxious.        Objective:   Physical Exam Constitutional:  Obese female, no acute distress  HENT:  Nares patent without discharge, deviated septum to left with narrowing.  Oropharynx without exudate, palate and uvula are moderately elongated.  Eyes:  Perrla, eomi, no scleral icterus  Neck:  No JVD, no TMG  Cardiovascular:  Normal rate, regular rhythm, no rubs or gallops.  No murmurs  Intact distal pulses  Pulmonary :  Normal breath sounds, no stridor or respiratory distress   No rales, rhonchi, or wheezing  Abdominal:  Soft, nondistended, bowel sounds present.  No tenderness noted.   Musculoskeletal:  No lower extremity edema noted.  Lymph Nodes:  No cervical lymphadenopathy noted  Skin:  No cyanosis noted  Neurologic:  Alert, appropriate, moves all 4 extremities without obvious deficit.         Assessment & Plan:

## 2015-02-24 NOTE — Assessment & Plan Note (Signed)
The patient's history is very suspicious for clinically significant sleep disordered breathing. I have reviewed the pathophysiology of sleep apnea with her in detail, and answered all of her questions. I have also reviewed the various impacts that untreated sleep apnea can have on cardiovascular health and quality of life. She will obviously need to have a sleep study in order to establish a diagnosis, she is an excellent candidate for home sleep testing. The patient is agreeable to this approach.

## 2015-02-24 NOTE — Patient Instructions (Signed)
Will schedule for a home sleep study, and will call once the results are available.  Work on weight reduction

## 2015-03-13 ENCOUNTER — Other Ambulatory Visit: Payer: Self-pay | Admitting: Family Medicine

## 2015-03-15 ENCOUNTER — Encounter: Payer: Self-pay | Admitting: Family Medicine

## 2015-03-15 ENCOUNTER — Ambulatory Visit (INDEPENDENT_AMBULATORY_CARE_PROVIDER_SITE_OTHER): Payer: No Typology Code available for payment source | Admitting: Family Medicine

## 2015-03-15 VITALS — BP 106/74 | HR 73 | Temp 98.1°F | Resp 18 | Wt 214.4 lb

## 2015-03-15 DIAGNOSIS — E785 Hyperlipidemia, unspecified: Secondary | ICD-10-CM

## 2015-03-15 DIAGNOSIS — E538 Deficiency of other specified B group vitamins: Secondary | ICD-10-CM | POA: Diagnosis not present

## 2015-03-15 DIAGNOSIS — Z0000 Encounter for general adult medical examination without abnormal findings: Secondary | ICD-10-CM | POA: Diagnosis not present

## 2015-03-15 DIAGNOSIS — E559 Vitamin D deficiency, unspecified: Secondary | ICD-10-CM | POA: Diagnosis not present

## 2015-03-15 DIAGNOSIS — R739 Hyperglycemia, unspecified: Secondary | ICD-10-CM

## 2015-03-15 NOTE — Patient Instructions (Signed)
Your blood glucose is borderline high  Work on decreasing sweets and simple sugars  Aim for exercise 5 days per week  Work on weight loss to prevent diabetes Go forward with sleep apnea evaluation   Schedule fasting labs for 6 mo   Call back and make an appt with Dr Lorelei Pont for left leg pain - I suspect bursitis of the hip

## 2015-03-15 NOTE — Progress Notes (Signed)
Subjective:    Patient ID: Pamela Anthony, female    DOB: March 16, 1956, 59 y.o.   MRN: [id number]  HPI Here for health maintenance exam and to review chronic medical problems    Wt is up 2 lb with bmi of 79  Saw pulmonary for sleep evaluation  Working on order for sleep study  She is thinking about getting her deviated septum fixed   HepC/ HIV screening - declines/not high risk   Flu shot 9/15   Mammogram 12/15 - normal  Self exam -no lumps   Pap 2014  Normal  No gyn problems  No vaginal bleeding  No hx of HPV or abn paps  No new partners   Colonoscopy 3/09 - 10 year recall/no polyps  No family hx of colon cancer    Td 9/15    D level is good at 51  dexa in nl range 2013  No fractures  No falls in the past year    (had one several years ago)  No regular exercise - but planning to start going to a women's gym     Chemistry      Component Value Date/Time   NA 141 08/26/2014 0801   K 3.4* 08/26/2014 0801   CL 107 08/26/2014 0801   CO2 24 08/26/2014 0801   BUN 16 08/26/2014 0801   CREATININE 0.8 08/26/2014 0801      Component Value Date/Time   CALCIUM 8.8 08/26/2014 0801   ALKPHOS 68 08/26/2014 0801   AST 17 08/26/2014 0801   ALT 18 08/26/2014 0801   BILITOT 0.6 08/26/2014 0801     she does eat bananas for potassium / sweet potatoes   Glucose 106 - fasting  No excessive thirst or urination (nothing more than usual)  Is obese  She does eat a lot of sugar - admits this  She can decrease her sugar intake   Lipids  On crestor Lab Results  Component Value Date   CHOL 117 08/26/2014   CHOL 127 10/12/2013   CHOL 237* 08/25/2013   Lab Results  Component Value Date   HDL 37.40* 08/26/2014   HDL 45.50 10/12/2013   HDL 38.30* 08/25/2013   Lab Results  Component Value Date   LDLCALC 63 08/26/2014   LDLCALC 60 10/12/2013   LDLCALC 87 09/25/2012   Lab Results  Component Value Date   TRIG 82.0 08/26/2014   TRIG 106.0 10/12/2013   TRIG 152.0*  08/25/2013   Lab Results  Component Value Date   CHOLHDL 3 08/26/2014   CHOLHDL 3 10/12/2013   CHOLHDL 6 08/25/2013   Lab Results  Component Value Date   LDLDIRECT 182.2 08/25/2013   LDLDIRECT 183.6 08/07/2012   LDLDIRECT 170.8 04/23/2011    Needs to increase exercise to inc HDL again  Diet is fair   Lab Results  Component Value Date   TSH 2.33 08/26/2014    Lab Results  Component Value Date   VITAMINB12 553 08/26/2014   Still getting pain in the back of her head - tightness and sensation wraps around to temples  ? Neck tension    L leg hurts all the time  Had a fall several years ago  achey shin to the hip- not worse in any one place  Lying on that side makes it worse  Feels better to extend her leg while sitting  Stairs are bad  Moving after inactivity is bad   Ache from top of foot to her hip - ?  What it is  Feels tight but not visually swollen  Some noise from knee   Patient Active Problem List   Diagnosis Date Noted  . Somnolence 01/16/2015  . Stress reaction 01/16/2015  . Snoring 01/16/2015  . Neck pain 07/04/2014  . Acute bronchitis 04/04/2014  . Viral URI with cough 12/28/2013  . Encounter for routine gynecological examination 09/01/2013  . Depression with anxiety 01/18/2013  . Post-menopausal 08/14/2012  . Other screening mammogram 04/29/2011  . Routine general medical examination at a health care facility 04/22/2011  . ARTHRALGIA 02/28/2010  . Vitamin D deficiency 03/08/2009  . IRRITABLE BOWEL SYNDROME 06/27/2008  . B12 deficiency 02/23/2008  . POLYP, COLON 03/06/2007  . Hyperlipidemia 03/06/2007  . ALLERGIC RHINITIS 03/06/2007  . GERD 03/06/2007  . HIATAL HERNIA 03/06/2007  . LOW BACK PAIN 03/06/2007  . OSA (obstructive sleep apnea) 03/06/2007  . URINARY INCONTINENCE 03/06/2007  . MIGRAINES, HX OF 03/06/2007   Past Medical History  Diagnosis Date  . Allergic rhinitis   . Iron deficiency anemia   . GERD (gastroesophageal reflux  disease)     EGD 10/2004  . Hyperlipemia   . Low back pain   . Urinary incontinence   . Obesity   . B12 deficiency   . Vitamin D deficiency     mild  . Degenerative disc disease     back  . Fungal infection 2005    lamasil   . Hemorrhoids, internal     colonoscopy 02/2008   Past Surgical History  Procedure Laterality Date  . Cesarean section  1985  . Nasal sinus surgery  2000's    right maxillary  . Tonsillectomy and adenoidectomy    . Knee surgery  1970's    right  . Hand surgery  2000    nodule removed  . Dilation and curettage of uterus  2005   History  Substance Use Topics  . Smoking status: Never Smoker   . Smokeless tobacco: Never Used  . Alcohol Use: 0.0 oz/week    0 Standard drinks or equivalent per week     Comment: occasional   Family History  Problem Relation Age of Onset  . Coronary artery disease Father     in his 45's  . Arthritis Father   . Stroke Father   . Heart disease Father     pacemaker  . Prostate cancer Father   . Breast cancer Maternal Grandmother    Allergies  Allergen Reactions  . Atorvastatin     REACTION: myalgias  . Oxybutynin     REACTION: rash   Current Outpatient Prescriptions on File Prior to Visit  Medication Sig Dispense Refill  . Calcium Carbonate-Vitamin D (CALTRATE 600+D) 600-400 MG-UNIT per tablet Take 1 tablet by mouth daily.      . Cholecalciferol (VITAMIN D-3 PO) Take 1 tablet by mouth daily.    . CRESTOR 10 MG tablet TAKE ONE TABLET BY MOUTH ONCE DAILY 30 tablet 5  . Cyanocobalamin (B-12 PO) Take by mouth daily.    . cyclobenzaprine (FLEXERIL) 10 MG tablet Take 0.5 tablets (5 mg total) by mouth 3 (three) times daily as needed. For muscle spasm and pain 30 tablet 2  . FLUoxetine (PROZAC) 20 MG tablet TAKE TWO TABLETS BY MOUTH ONCE DAILY 180 tablet 3  . hydrochlorothiazide (HYDRODIURIL) 25 MG tablet TAKE ONE-HALF TO ONE TABLET BY MOUTH ONCE DAILY AS NEEDED 30 tablet 5  . meloxicam (MOBIC) 15 MG tablet Take 1 tablet  (15 mg total)  by mouth daily as needed for pain (with food). 30 tablet 4  . omeprazole (PRILOSEC) 20 MG capsule TAKE ONE CAPSULE BY MOUTH ONCE DAILY 90 capsule 3  . topiramate (TOPAMAX) 100 MG tablet TAKE ONE TABLET BY MOUTH ONCE DAILY 30 tablet 5  . VESICARE 10 MG tablet TAKE ONE TABLET BY MOUTH ONCE DAILY 90 tablet 3  . zolmitriptan (ZOMIG) 5 MG tablet Take 1 tablet (5 mg total) by mouth as needed for migraine. 10 tablet 11   No current facility-administered medications on file prior to visit.       Review of Systems Review of Systems  Constitutional: Negative for fever, appetite change, and unexpected weight change. pos for fatigue  Eyes: Negative for pain and visual disturbance.  Respiratory: Negative for cough and shortness of breath.   Cardiovascular: Negative for cp or palpitations    Gastrointestinal: Negative for nausea, diarrhea and constipation.  Genitourinary: Negative for urgency and frequency.  Skin: Negative for pallor or rash  MSK pos for L leg pain/ache  Neurological: Negative for weakness, light-headedness, numbness and headaches.  Hematological: Negative for adenopathy. Does not bruise/bleed easily.  Psychiatric/Behavioral: Negative for dysphoric mood. The patient is not nervous/anxious.         Objective:   Physical Exam  Constitutional: She appears well-developed and well-nourished. No distress.  obese and well appearing   HENT:  Head: Normocephalic and atraumatic.  Right Ear: External ear normal.  Left Ear: External ear normal.  Mouth/Throat: Oropharynx is clear and moist.  Eyes: Conjunctivae and EOM are normal. Pupils are equal, round, and reactive to light. No scleral icterus.  Neck: Normal range of motion. Neck supple. No JVD present. Carotid bruit is not present. No thyromegaly present.  Cardiovascular: Normal rate, regular rhythm, normal heart sounds and intact distal pulses.  Exam reveals no gallop.   Pulmonary/Chest: Effort normal and breath sounds  normal. No respiratory distress. She has no wheezes. She exhibits no tenderness.  Abdominal: Soft. Bowel sounds are normal. She exhibits no distension, no abdominal bruit and no mass. There is no tenderness.  Genitourinary: No breast swelling, tenderness, discharge or bleeding.  Breast exam: No mass, nodules, thickening, tenderness, bulging, retraction, inflamation, nipple discharge or skin changes noted.  No axillary or clavicular LA.      Musculoskeletal: Normal range of motion. She exhibits no edema or tenderness.  Tender R greater trochanter Nl rom hip/knee and ankle No edema   Lymphadenopathy:    She has no cervical adenopathy.  Neurological: She is alert. She has normal reflexes. No cranial nerve deficit. She exhibits normal muscle tone. Coordination normal.  Skin: Skin is warm and dry. No rash noted. No erythema. No pallor.  Psychiatric: She has a normal mood and affect.          Assessment & Plan:   Problem List Items Addressed This Visit      Digestive   B12 deficiency - Primary    Lab Results  Component Value Date   [id number] 825 08/26/2014   Continue to follow         Other   Hyperlipidemia    Disc goals for lipids and reasons to control them Rev labs with pt At goal with crestor and diet  Rev low sat fat diet in detail       Routine general medical examination at a health care facility    Reviewed health habits including diet and exercise and skin cancer prevention Reviewed appropriate screening tests for age  Also reviewed health mt list, fam hx and immunization status , as well as social and family history   Labs rev from fall  See HPI Recommend wt loss and exercise  Suspect tx sleep apnea will help energy level and motivation       Vitamin D deficiency    52 with current supplementation  Disc imp to bone and overall health Will continue current dose

## 2015-03-16 DIAGNOSIS — R7303 Prediabetes: Secondary | ICD-10-CM | POA: Insufficient documentation

## 2015-03-16 DIAGNOSIS — R739 Hyperglycemia, unspecified: Secondary | ICD-10-CM | POA: Insufficient documentation

## 2015-03-16 NOTE — Assessment & Plan Note (Signed)
Disc goals for lipids and reasons to control them Rev labs with pt At goal with crestor and diet  Rev low sat fat diet in detail

## 2015-03-16 NOTE — Assessment & Plan Note (Signed)
Lab Results  Component Value Date   [id number] 08/26/2014   Continue to follow

## 2015-03-16 NOTE — Assessment & Plan Note (Signed)
In obese pt  At risk for DM Disc lifestyle change  Low glycemic diet Handout given Lab and f/u 6 mo for A1c

## 2015-03-16 NOTE — Assessment & Plan Note (Signed)
52 with current supplementation  Disc imp to bone and overall health Will continue current dose

## 2015-03-16 NOTE — Assessment & Plan Note (Signed)
Reviewed health habits including diet and exercise and skin cancer prevention Reviewed appropriate screening tests for age  Also reviewed health mt list, fam hx and immunization status , as well as social and family history   Labs rev from fall  See HPI Recommend wt loss and exercise  Suspect tx sleep apnea will help energy level and motivation

## 2015-03-22 ENCOUNTER — Ambulatory Visit (INDEPENDENT_AMBULATORY_CARE_PROVIDER_SITE_OTHER): Payer: No Typology Code available for payment source | Admitting: Family Medicine

## 2015-03-22 ENCOUNTER — Encounter: Payer: Self-pay | Admitting: Family Medicine

## 2015-03-22 VITALS — BP 100/70 | HR 82 | Temp 97.7°F | Ht 68.0 in | Wt 213.8 lb

## 2015-03-22 DIAGNOSIS — M7062 Trochanteric bursitis, left hip: Secondary | ICD-10-CM | POA: Diagnosis not present

## 2015-03-22 DIAGNOSIS — G5702 Lesion of sciatic nerve, left lower limb: Secondary | ICD-10-CM

## 2015-03-22 MED ORDER — METHYLPREDNISOLONE ACETATE 40 MG/ML IJ SUSP
80.0000 mg | Freq: Once | INTRAMUSCULAR | Status: AC
  Administered 2015-03-22: 80 mg via INTRA_ARTICULAR

## 2015-03-22 NOTE — Progress Notes (Signed)
Pre visit review using our clinic review tool, if applicable. No additional management support is needed unless otherwise documented below in the visit note. 

## 2015-03-22 NOTE — Progress Notes (Signed)
Dr. Frederico Hamman T. Cheyenne Schumm, MD, [facility] [place], [address] Phone: [phone] Fax: [phone]  03/22/2015  Patient: Pamela Anthony, MRN: [id number], DOB: Nov 15, 1956, 59 y.o.  Primary Physician:  Loura Pardon, MD  Chief Complaint: Leg Pain  Subjective:   Pamela Anthony is a 59 y.o. very pleasant female patient who presents with the following:  Patient complains of both lateral left-sided hip pain as well as radicular leg pain that goes down to her foot intermittently. It is worse when she is sitting and she sits most of the time at work, virtually all day. She has a relatively sedentary lifestyle, and she does not do any kind of regular physical activity. This is been going on for many months, and intermittently she will have flareups.  Left leg, achy from foot all the way to the hip. Cannot sleep on the left side.  Couple of years ago, fell on the b knees Front of leg.  No activty - sitting mostly at work.   Past Medical History, Surgical History, Social History, Family History, Problem List, Medications, and Allergies have been reviewed and updated if relevant.  GEN: No fevers, chills. Nontoxic. Primarily MSK c/o today. MSK: Detailed in the HPI GI: tolerating PO intake without difficulty Neuro: No numbness, parasthesias, or tingling associated. Otherwise the pertinent positives of the ROS are noted above.   Objective:   BP 100/70 mmHg  Pulse 82  Temp(Src) 97.7 F (36.5 C) (Oral)  Ht 5\' 8"  (1.727 m)  Wt 213 lb 12 oz (96.956 kg)  BMI 32.51 kg/m2  LMP 04/17/2011   GEN: WDWN, NAD, Non-toxic, Alert & Oriented x 3 HEENT: Atraumatic, Normocephalic.  Ears and Nose: No external deformity. EXTR: No clubbing/cyanosis/edema NEURO: Normal gait.  PSYCH: Normally interactive. Conversant. Not depressed or anxious appearing.  Calm demeanor.   HIP EXAM: SIDE: L ROM: Abduction, Flexion, Internal and External range of  motion: full Pain with terminal IROM and EROM: mild GTB: TTP SLR: NEG Knees: No effusion FABER: NT REVERSE FABER: NT, neg Piriformis: TTP on the L Str: flexion: 5/5 abduction: 5/5 adduction: 5/5 Strength testing non-tender   Radiology: No results found.  Assessment and Plan:   Piriformis syndrome of left side  Greater trochanteric bursitis, left - Plan: methylPREDNISolone acetate (DEPO-MEDROL) injection 80 mg  Using an anatomical model, reviewed with the patient the structures involved and how they related to diagnosis. The patient indicated understanding.   A rehabilitation program from the [employer] was reviewed with the patient face to face for their condition.  Also given a handout with more extensive Piriformis stretching, hip flexor and abductor strengthening, ham stretching  Rec deep massage, explained self-massage with ball   Trochanteric bursa injections have clearly been shown to help with acute bursitis, and the patient would benefit.    Trochanteric Bursitis Injection, LEFT Verbal consent obtained. Risks (including infection, potential atrophy), benefits, and alternatives reviewed. Greater trochanter sterilely prepped with Chloraprep. Ethyl Chloride used for anesthesia. 8 cc of Lidocaine 1% injected with Depo-Medrol 80 mg into trochanteric bursa at area of maximal tenderness at greater trochanter and in 2 places at glute medius insertion. Needle taken to bone to troch bursa, flows easily. Bursa massaged. No bleeding and no complications. Decreased pain after injection. Needle: 22 gauge spinal needle   Follow-up: No Follow-up on file.  Patient Instructions  PIRIFORMIS SYNDROME REHAB 1. Work on pretzel stretching, shoulder back and leg draped in  front. 3-5 sets, 30 sec.. 2. hip abductor rotations. standing, hip flexion and rotation outward then inward. 3 sets, 15 reps. when can do comfortably, add ankle weights starting at 2 pounds.    3. cross over stretching - shoulder back to ground, same side leg crossover. 3-5 sets for 30 min..  4. SINK STRETCH - YOU CAN DO THIS WHENEVER YOU WANT DURING THE DAY  Tennis ball underneath area in buttocks - on a hard surface underneath Can also massage this area with an Counsellor or hand     Signed,  Shyheim Tanney T. Leticia Mcdiarmid, MD   Patient's Medications  New Prescriptions   No medications on file  Previous Medications   CALCIUM CARBONATE-VITAMIN D (CALTRATE 600+D) 600-400 MG-UNIT PER TABLET    Take 1 tablet by mouth daily.     CHOLECALCIFEROL (VITAMIN D-3 PO)    Take 1 tablet by mouth daily.   CRESTOR 10 MG TABLET    TAKE ONE TABLET BY MOUTH ONCE DAILY   CYANOCOBALAMIN (B-12 PO)    Take by mouth daily.   CYCLOBENZAPRINE (FLEXERIL) 10 MG TABLET    Take 0.5 tablets (5 mg total) by mouth 3 (three) times daily as needed. For muscle spasm and pain   FLUOXETINE (PROZAC) 20 MG TABLET    TAKE TWO TABLETS BY MOUTH ONCE DAILY   HYDROCHLOROTHIAZIDE (HYDRODIURIL) 25 MG TABLET    TAKE ONE-HALF TO ONE TABLET BY MOUTH ONCE DAILY AS NEEDED   MELOXICAM (MOBIC) 15 MG TABLET    Take 1 tablet (15 mg total) by mouth daily as needed for pain (with food).   OMEPRAZOLE (PRILOSEC) 20 MG CAPSULE    TAKE ONE CAPSULE BY MOUTH ONCE DAILY   TOPIRAMATE (TOPAMAX) 100 MG TABLET    TAKE ONE TABLET BY MOUTH ONCE DAILY   VESICARE 10 MG TABLET    TAKE ONE TABLET BY MOUTH ONCE DAILY   ZOLMITRIPTAN (ZOMIG) 5 MG TABLET    Take 1 tablet (5 mg total) by mouth as needed for migraine.  Modified Medications   No medications on file  Discontinued Medications   No medications on file

## 2015-03-22 NOTE — Patient Instructions (Signed)
PIRIFORMIS SYNDROME REHAB 1. Work on pretzel stretching, shoulder back and leg draped in front. 3-5 sets, 30 sec.. 2. hip abductor rotations. standing, hip flexion and rotation outward then inward. 3 sets, 15 reps. when can do comfortably, add ankle weights starting at 2 pounds.  3. cross over stretching - shoulder back to ground, same side leg crossover. 3-5 sets for 30 min..  4. SINK STRETCH - YOU CAN DO THIS WHENEVER YOU WANT DURING THE DAY  Tennis ball underneath area in buttocks - on a hard surface underneath Can also massage this area with an Counsellor or hand

## 2015-03-23 DIAGNOSIS — G4733 Obstructive sleep apnea (adult) (pediatric): Secondary | ICD-10-CM | POA: Diagnosis not present

## 2015-03-27 ENCOUNTER — Other Ambulatory Visit: Payer: Self-pay | Admitting: Family Medicine

## 2015-03-27 DIAGNOSIS — G4733 Obstructive sleep apnea (adult) (pediatric): Secondary | ICD-10-CM | POA: Diagnosis not present

## 2015-03-27 NOTE — Telephone Encounter (Signed)
Received refill request electronically from pharmacy. See allergy/contraindication on Crestor. Last refill on Topamax 10/221/15 #30/5. Is it okay to refill medications?

## 2015-03-27 NOTE — Telephone Encounter (Signed)
Please refill both for 6 months

## 2015-03-27 NOTE — Telephone Encounter (Signed)
rx sent to pharmacy by e-script  

## 2015-03-29 ENCOUNTER — Other Ambulatory Visit: Payer: Self-pay | Admitting: *Deleted

## 2015-03-29 DIAGNOSIS — G4733 Obstructive sleep apnea (adult) (pediatric): Secondary | ICD-10-CM

## 2015-04-06 ENCOUNTER — Encounter: Payer: Self-pay | Admitting: Pulmonary Disease

## 2015-04-06 ENCOUNTER — Ambulatory Visit (INDEPENDENT_AMBULATORY_CARE_PROVIDER_SITE_OTHER): Payer: No Typology Code available for payment source | Admitting: Pulmonary Disease

## 2015-04-06 VITALS — BP 116/72 | HR 83 | Temp 97.9°F | Ht 68.0 in | Wt 212.0 lb

## 2015-04-06 DIAGNOSIS — G4733 Obstructive sleep apnea (adult) (pediatric): Secondary | ICD-10-CM

## 2015-04-06 NOTE — Assessment & Plan Note (Signed)
The patient has severe obstructive sleep apnea by her recent home sleep testing, and would benefit from a trial of C Pap while working on weight loss. The patient is agreeable to this approach. I will set the patient up on cpap at a moderate pressure level to allow for desensitization, and will troubleshoot the device over the next 4-6weeks if needed.  The pt is to call me if having issues with tolerance.  Will then optimize the pressure once patient is able to wear cpap on a consistent basis.

## 2015-04-06 NOTE — Patient Instructions (Signed)
Will start on cpap.  Please call if having issues with tolerance. Work on weight loss followup again in 12 weeks.

## 2015-04-06 NOTE — Progress Notes (Signed)
   Subjective:    Patient ID: Pamela Anthony, female    DOB: 1956-02-12, 59 y.o.   MRN: [id number]  HPI The patient comes in today for follow-up of her recent home sleep test. She was found to have severe obstructive sleep apnea, with an AHI of 40 events per hour and oxygen desaturation as low as 80%. Reviewed the study with her in detail, and answered all of her questions.   Review of Systems  Constitutional: Negative for fever and unexpected weight change.  HENT: Negative for congestion, dental problem, ear pain, nosebleeds, postnasal drip, rhinorrhea, sinus pressure, sneezing, sore throat and trouble swallowing.   Eyes: Negative for redness and itching.  Respiratory: Negative for cough, chest tightness, shortness of breath and wheezing.   Cardiovascular: Negative for palpitations and leg swelling.  Gastrointestinal: Negative for nausea and vomiting.  Genitourinary: Negative for dysuria.  Musculoskeletal: Negative for joint swelling.  Skin: Negative for rash.  Neurological: Negative for headaches.  Hematological: Does not bruise/bleed easily.  Psychiatric/Behavioral: Negative for dysphoric mood. The patient is not nervous/anxious.        Objective:   Physical Exam Overweight female in no acute distress Nose without purulence or discharge noted Neck without lymphadenopathy or thyromegaly Lower extremities without significant edema, no cyanosis Alert and oriented, moves all 4 extremities.       Assessment & Plan:

## 2015-06-02 ENCOUNTER — Other Ambulatory Visit: Payer: Self-pay | Admitting: Family Medicine

## 2015-06-20 ENCOUNTER — Ambulatory Visit (INDEPENDENT_AMBULATORY_CARE_PROVIDER_SITE_OTHER): Payer: No Typology Code available for payment source | Admitting: Family Medicine

## 2015-06-20 ENCOUNTER — Encounter: Payer: Self-pay | Admitting: Family Medicine

## 2015-06-20 ENCOUNTER — Ambulatory Visit (INDEPENDENT_AMBULATORY_CARE_PROVIDER_SITE_OTHER)
Admission: RE | Admit: 2015-06-20 | Discharge: 2015-06-20 | Disposition: A | Discharge status: Home / Self Care | Payer: No Typology Code available for payment source | Source: Ambulatory Visit | Attending: Family Medicine | Admitting: Family Medicine

## 2015-06-20 VITALS — BP 118/70 | HR 81 | Temp 98.3°F | Ht 68.0 in | Wt 215.2 lb

## 2015-06-20 DIAGNOSIS — R002 Palpitations: Secondary | ICD-10-CM

## 2015-06-20 DIAGNOSIS — K219 Gastro-esophageal reflux disease without esophagitis: Secondary | ICD-10-CM

## 2015-06-20 DIAGNOSIS — R198 Other specified symptoms and signs involving the digestive system and abdomen: Secondary | ICD-10-CM | POA: Insufficient documentation

## 2015-06-20 DIAGNOSIS — M546 Pain in thoracic spine: Secondary | ICD-10-CM

## 2015-06-20 DIAGNOSIS — R0789 Other chest pain: Secondary | ICD-10-CM | POA: Diagnosis not present

## 2015-06-20 DIAGNOSIS — R0989 Other specified symptoms and signs involving the circulatory and respiratory systems: Secondary | ICD-10-CM | POA: Insufficient documentation

## 2015-06-20 DIAGNOSIS — F458 Other somatoform disorders: Secondary | ICD-10-CM | POA: Diagnosis not present

## 2015-06-20 NOTE — Patient Instructions (Signed)
Cut back or stop the ibuprofen  If needed - meloxicam with food instead (but nothing is pref to either)  Chest xray today  Get claritin over the counter 10 mg daily for post nasal drip  Increase prilosec to twice daily for acid reflux that could cause your throat symptoms  However - if you develop cold symptoms - that could be the cause  Update me in 1-2 weeks with how throat is (if not better- need to get you to ENT) Stop at check out for ref to cardiology for palpitations and chest discomfort

## 2015-06-20 NOTE — Progress Notes (Signed)
Subjective:    Patient ID: Pamela Anthony, female    DOB: Apr 11, 1956, 59 y.o.   MRN: [id number]  HPI Pt here with several acute issues incl throat problems and palpitations and upper back pain   Throat feels swollen and is hoarse (not sore but scratchy) - the last few days  Unsure if she is coming down with a cold  Clears throat constantly -unsure if post nasal drip  (wakes up with gunk)  Some cough at night   GERD has been "ok" No heartburn  No stomach pain or burning  No regurg of acid  Using prilosec   Wt is up 3 lb     Of note severe OSA - using cpap (it has a humidifier)  It is helping some   Wt is up 3 lb with bmi of 32  BP Readings from Last 3 Encounters:  06/20/15 118/70  04/06/15 116/72  03/22/15 100/70     EKG today NSR with rate of 73 and some flattening of T waves in lateral leads  Having heart palpitations (first times in years)- now happening frequently  Feels like she skips a beat occasionally through the day - that started since the cpap  Avoids caffeine  Avoids chocolate Exercising more  Sometimes she feels some pressure in chest - when walking at the gym (no nausea or faint feeling or pallor) , perhaps a little dizzy  Last cardiol visit was years ago for a dizzy spell    Lab Results  Component Value Date   CHOL 117 08/26/2014   HDL 37.40* 08/26/2014   LDLCALC 63 08/26/2014   LDLDIRECT 182.2 08/25/2013   TRIG 82.0 08/26/2014   CHOLHDL 3 08/26/2014     Back is achey between shoulder blades  Sometimes twisting helps a bit  Comes and goes    More headaches than normal   Fingers have been tingly   Last colonosc 09 Hx of GERD Takes prilosec 20   meloxicam - not taking at all right now  Loading up on ibuprofen    Patient Active Problem List   Diagnosis Date Noted  . Palpitations 06/20/2015  . Globus sensation 06/20/2015  . Thoracic back pain 06/20/2015  . Chest discomfort 06/20/2015  . Hyperglycemia 03/16/2015  . Somnolence  01/16/2015  . Stress reaction 01/16/2015  . Snoring 01/16/2015  . Neck pain 07/04/2014  . Acute bronchitis 04/04/2014  . Viral URI with cough 12/28/2013  . Encounter for routine gynecological examination 09/01/2013  . Depression with anxiety 01/18/2013  . Post-menopausal 08/14/2012  . Other screening mammogram 04/29/2011  . Routine general medical examination at a health care facility 04/22/2011  . ARTHRALGIA 02/28/2010  . Vitamin D deficiency 03/08/2009  . IRRITABLE BOWEL SYNDROME 06/27/2008  . B12 deficiency 02/23/2008  . POLYP, COLON 03/06/2007  . Hyperlipidemia 03/06/2007  . ALLERGIC RHINITIS 03/06/2007  . GERD 03/06/2007  . HIATAL HERNIA 03/06/2007  . LOW BACK PAIN 03/06/2007  . OSA (obstructive sleep apnea) 03/06/2007  . URINARY INCONTINENCE 03/06/2007  . MIGRAINES, HX OF 03/06/2007   Past Medical History  Diagnosis Date  . Allergic rhinitis   . Iron deficiency anemia   . GERD (gastroesophageal reflux disease)     EGD 10/2004  . Hyperlipemia   . Low back pain   . Urinary incontinence   . Obesity   . B12 deficiency   . Vitamin D deficiency     mild  . Degenerative disc disease     back  . Fungal  infection 2005    lamasil   . Hemorrhoids, internal     colonoscopy 02/2008   Past Surgical History  Procedure Laterality Date  . Cesarean section  1985  . Nasal sinus surgery  2000's    right maxillary  . Tonsillectomy and adenoidectomy    . Knee surgery  1970's    right  . Hand surgery  2000    nodule removed  . Dilation and curettage of uterus  2005   History  Substance Use Topics  . Smoking status: Never Smoker   . Smokeless tobacco: Never Used  . Alcohol Use: 0.0 oz/week    0 Standard drinks or equivalent per week     Comment: occasional   Family History  Problem Relation Age of Onset  . Coronary artery disease Father     in his 74's  . Arthritis Father   . Stroke Father   . Heart disease Father     pacemaker  . Prostate cancer Father   .  Breast cancer Maternal Grandmother    Allergies  Allergen Reactions  . Atorvastatin     REACTION: myalgias  . Oxybutynin     REACTION: rash   Current Outpatient Prescriptions on File Prior to Visit  Medication Sig Dispense Refill  . Calcium Carbonate-Vitamin D (CALTRATE 600+D) 600-400 MG-UNIT per tablet Take 1 tablet by mouth daily.      . Cholecalciferol (VITAMIN D-3 PO) Take 1 tablet by mouth daily.    . CRESTOR 10 MG tablet TAKE ONE TABLET BY MOUTH ONCE DAILY 30 tablet 6  . Cyanocobalamin (B-12 PO) Take by mouth daily.    . cyclobenzaprine (FLEXERIL) 10 MG tablet Take 0.5 tablets (5 mg total) by mouth 3 (three) times daily as needed. For muscle spasm and pain 30 tablet 2  . FLUoxetine (PROZAC) 20 MG tablet TAKE TWO TABLETS BY MOUTH ONCE DAILY 180 tablet 3  . hydrochlorothiazide (HYDRODIURIL) 25 MG tablet TAKE ONE-HALF TO ONE TABLET BY MOUTH ONCE DAILY AS NEEDED 30 tablet 5  . meloxicam (MOBIC) 15 MG tablet Take 1 tablet (15 mg total) by mouth daily as needed for pain (with food). 30 tablet 4  . omeprazole (PRILOSEC) 20 MG capsule TAKE ONE CAPSULE BY MOUTH ONCE DAILY 90 capsule 3  . topiramate (TOPAMAX) 100 MG tablet TAKE ONE TABLET BY MOUTH ONCE DAILY 30 tablet 6  . VESICARE 10 MG tablet TAKE ONE TABLET BY MOUTH ONCE DAILY 90 tablet 3  . zolmitriptan (ZOMIG) 5 MG tablet TAKE ONE TABLET BY MOUTH AS NEEDED FOR MIGRAINE (MAX 2/DAY) 10 tablet 1   No current facility-administered medications on file prior to visit.    Review of Systems Review of Systems  Constitutional: Negative for fever, appetite change, and unexpected weight change.  Eyes: Negative for pain and visual disturbance.  ENT pos for throat clearing and post nasal drip / neg for ST Respiratory: Negative for cough and shortness of breath.  neg for wheezing  Cardiovascular: Neg for PND/orthopnea or pedal edema     Gastrointestinal: Negative for nausea, diarrhea and constipation.  Genitourinary: Negative for urgency and  frequency.  Skin: Negative for pallor or rash   Neurological: Negative for weakness, light-headedness, numbness and headaches.  Hematological: Negative for adenopathy. Does not bruise/bleed easily.  Psychiatric/Behavioral: Negative for dysphoric mood. The patient is not nervous/anxious.         Objective:   Physical Exam  Constitutional: She appears well-developed and well-nourished. No distress.  obese and well appearing  HENT:  Head: Normocephalic and atraumatic.  Mouth/Throat: Oropharynx is clear and moist.  Eyes: Conjunctivae and EOM are normal. Pupils are equal, round, and reactive to light.  Neck: Normal range of motion. Neck supple. No JVD present. Carotid bruit is not present. No thyromegaly present.  Cardiovascular: Normal rate, regular rhythm, normal heart sounds and intact distal pulses.  Exam reveals no gallop.   Pulmonary/Chest: Effort normal and breath sounds normal. No respiratory distress. She has no wheezes. She has no rales. She exhibits no tenderness.  No crackles  Abdominal: Soft. Bowel sounds are normal. She exhibits no distension, no abdominal bruit and no mass. There is no hepatosplenomegaly. There is tenderness in the epigastric area. There is no rigidity, no guarding, no CVA tenderness, no tenderness at McBurney's point and negative Murphy's sign.  Musculoskeletal: She exhibits no edema.  Lymphadenopathy:    She has no cervical adenopathy.  Neurological: She is alert. She has normal reflexes.  Skin: Skin is warm and dry. No rash noted.  Psychiatric:  Mildly anxious           Assessment & Plan:   Problem List Items Addressed This Visit    Chest discomfort    With some flattened T waves on EKG No discomfort now  Ref to cardiology for this and palpitations  inst to go to ED if symptoms return or persist cxr today      Relevant Orders   Ambulatory referral to Cardiology   GERD    Disc poss that this is the cause of globus sensation  Will stop  nsaid if able and inc ppi to bid and update        Globus sensation    Suspect multifactorial  Disc use of antihistamine to minimize post nasal drip  Watch for s/s of uri or virus  Inc ppi for gerd  If no improvement plan on ENT ref  Will update       Palpitations - Primary    Nl rhythm on EKG today  Having cp with walking as well Ref to cardiology for eval and tx       Relevant Orders   Ambulatory referral to Cardiology   Thoracic back pain    Suspect msk in origin but will check cxr as well       Relevant Orders   DG Chest 2 View (Completed)    Other Visit Diagnoses    Heart palpitations        Relevant Orders    EKG 12-Lead (Completed)

## 2015-06-20 NOTE — Progress Notes (Signed)
Pre visit review using our clinic review tool, if applicable. No additional management support is needed unless otherwise documented below in the visit note. 

## 2015-06-22 NOTE — Assessment & Plan Note (Signed)
With some flattened T waves on EKG No discomfort now  Ref to cardiology for this and palpitations  inst to go to ED if symptoms return or persist cxr today

## 2015-06-22 NOTE — Assessment & Plan Note (Signed)
Nl rhythm on EKG today  Having cp with walking as well Ref to cardiology for eval and tx

## 2015-06-22 NOTE — Assessment & Plan Note (Signed)
Suspect multifactorial  Disc use of antihistamine to minimize post nasal drip  Watch for s/s of uri or virus  Inc ppi for gerd  If no improvement plan on ENT ref  Will update

## 2015-06-22 NOTE — Assessment & Plan Note (Addendum)
Disc poss that this is the cause of globus sensation  Will stop nsaid if able and inc ppi to bid and update

## 2015-06-22 NOTE — Assessment & Plan Note (Signed)
Suspect msk in origin but will check cxr as well

## 2015-07-06 ENCOUNTER — Encounter: Payer: Self-pay | Admitting: Internal Medicine

## 2015-07-06 ENCOUNTER — Ambulatory Visit (INDEPENDENT_AMBULATORY_CARE_PROVIDER_SITE_OTHER): Payer: Managed Care, Other (non HMO) | Admitting: Internal Medicine

## 2015-07-06 VITALS — BP 122/80 | HR 98 | Ht 68.0 in | Wt 216.0 lb

## 2015-07-06 DIAGNOSIS — G4733 Obstructive sleep apnea (adult) (pediatric): Secondary | ICD-10-CM

## 2015-07-06 NOTE — Progress Notes (Signed)
Subjective:    Patient ID: Pamela Anthony, female    DOB: Jan 25, 1956, 59 y.o.   MRN: [id number]  HPI  04/06/15- Dr Gwenette Greet The patient comes in today for follow-up of her recent home sleep test. She was found to have severe obstructive sleep apnea, with an AHI of 40 events per hour and oxygen desaturation as low as 80%. Reviewed the study with her in detail, and answered all of her questions.  07/06/15-  59 yoF never smoker  Followed for OSA, complicated by allergic rhinitis, GERD Follow Up CPAP - wears 6-8 hrs, States she thinks it is helping - sleeps through the night. DME - [facility] New machine, auto 5-15. Gets globus sensation and dry cough. Does sleep through the night now and is more comfortable with CPAP than without it. PCP did chest x-ray "clear". HST 03/2015: AHI 40/hr.   ROS-see HPI   Negative unless "+" Constitutional:    weight loss, night sweats, fevers, chills, fatigue, lassitude. HEENT:    headaches, difficulty swallowing, tooth/dental problems, sore throat,       sneezing, itching, ear ache, nasal congestion, post nasal drip, snoring CV:    chest pain, orthopnea, PND, swelling in lower extremities, anasarca,                                                            dizziness, palpitations Resp:   shortness of breath with exertion or at rest.                productive cough,   non-productive cough, coughing up of blood.              change in color of mucus.  wheezing.   Skin:    rash or lesions. GI:  No-   heartburn, indigestion, abdominal pain, nausea, vomiting,  GU: . MS:   joint pain, Neuro-     nothing unusual Psych:  change in mood or affect.  depression or anxiety.   memory loss.   Objective:   OBJ- Physical Exam General- Alert, Oriented, Affect-appropriate, Distress- none acute, + overweight Skin- rash-none, lesions- none, excoriation- none Lymphadenopathy- none Head- atraumatic            Eyes- Gross vision intact, PERRLA, conjunctivae and secretions clear           Ears- Hearing, canals-normal            Nose- Clear, no-Septal dev, mucus, polyps, erosion, perforation             Throat- Mallampati III , mucosa clear , drainage- none, tonsils- atrophic Neck- flexible , trachea midline, no stridor , thyroid nl, carotid no bruit Chest - symmetrical excursion , unlabored           Heart/CV- RRR , no murmur , no gallop  , no rub, nl s1 s2                           - JVD- none , edema- none, stasis changes- none, varices- none           Lung- clear to P&A, wheeze- none, cough- none , dullness-none, rub- none           Chest wall-  Abd-  Br/ Gen/ Rectal- Not  done, not indicated Extrem- cyanosis- none, clubbing, none, atrophy- none, strength- nl Neuro- grossly intact to observation     Assessment & Plan:

## 2015-07-06 NOTE — Patient Instructions (Signed)
Order- DME [facility]   Continue CPAP auto, change pressure range to 10-15, mask of choice, humidifier, supplies                 Dx OSA  Please call as needed

## 2015-07-11 ENCOUNTER — Encounter: Payer: Self-pay | Admitting: Gastroenterology

## 2015-07-11 ENCOUNTER — Telehealth: Payer: Self-pay

## 2015-07-11 NOTE — Telephone Encounter (Signed)
Pt left v/m; pt has new ins and needs prior auth for [facility]are; [facility] [address].

## 2015-07-14 ENCOUNTER — Encounter: Payer: Self-pay | Admitting: Cardiovascular Disease

## 2015-07-14 ENCOUNTER — Ambulatory Visit (INDEPENDENT_AMBULATORY_CARE_PROVIDER_SITE_OTHER): Payer: Managed Care, Other (non HMO) | Admitting: Cardiovascular Disease

## 2015-07-14 VITALS — BP 100/68 | HR 75 | Ht 68.0 in | Wt 217.0 lb

## 2015-07-14 DIAGNOSIS — R002 Palpitations: Secondary | ICD-10-CM | POA: Diagnosis not present

## 2015-07-14 DIAGNOSIS — R0789 Other chest pain: Secondary | ICD-10-CM

## 2015-07-14 NOTE — Telephone Encounter (Signed)
PA was approved from 07/14/15-07/13/16, pt and pharmacy aware, approval letter placed in Dr. Marliss Coots inbox for signing

## 2015-07-14 NOTE — Patient Instructions (Signed)
Medication Instructions:  Your physician recommends that you continue on your current medications as directed. Please refer to the Current Medication list given to you today.   Labwork: none  Testing/Procedures: none  Follow-Up: Your physician recommends that you schedule a follow-up appointment as needed with Dr. Acie Fredrickson.    Any Other Special Instructions Will Be Listed Below (If Applicable).

## 2015-07-14 NOTE — Telephone Encounter (Signed)
PA done on [email or web address]

## 2015-07-14 NOTE — Progress Notes (Signed)
Cardiology Office Note   Date:  07/14/2015   ID:  Pamela Anthony, Pamela Anthony 1956-05-05, MRN [id number]  PCP:  Loura Pardon, MD  Cardiologist:   Acie Fredrickson Wonda Cheng, MD   Chief Complaint  Patient presents with  . other    Ref  by Dr. Glori Bickers for palpitations. Pt. c/o heart fluttering with chest heaviness.    Problem list 1. Chest pains 2.  Hyperlipidemia 3. Obstructive sleep apnea   History of Present Illness: Pamela Anthony is a 59 y.o. female who presents for palpitations and some chest pain  + family hx of CAD in her father's side. Has fairly frequent episodes of chest pressure  - a pushing sensation Not related to exertion or breathing.  Has difficulty taking a deep breath.  Not related to eating or drinking   Was walking on the treadmill some but has not been in a while.  Starting getting lightheadedness.  Works in Therapist, art at [employer] )   Also has palpitations  Have been present several months,  intermittent flutters .  She thinks its been since she started the CPAP for her OSA    Past Medical History  Diagnosis Date  . Allergic rhinitis   . Iron deficiency anemia   . GERD (gastroesophageal reflux disease)     EGD 10/2004  . Hyperlipemia   . Low back pain   . Urinary incontinence   . Obesity   . B12 deficiency   . Vitamin D deficiency     mild  . Degenerative disc disease     back  . Fungal infection 2005    lamasil   . Hemorrhoids, internal     colonoscopy 02/2008    Past Surgical History  Procedure Laterality Date  . Cesarean section  1985  . Nasal sinus surgery  2000's    right maxillary  . Tonsillectomy and adenoidectomy    . Knee surgery  1970's    right  . Hand surgery  2000    nodule removed  . Dilation and curettage of uterus  2005     Current Outpatient Prescriptions  Medication Sig Dispense Refill  . Calcium Carbonate-Vitamin D (CALTRATE 600+D) 600-400 MG-UNIT per tablet Take 1 tablet by mouth daily.      . cetirizine  (ZYRTEC) 5 MG tablet Take 5 mg by mouth daily.    . Cholecalciferol (VITAMIN D-3 PO) Take 1 tablet by mouth daily.    . CRESTOR 10 MG tablet TAKE ONE TABLET BY MOUTH ONCE DAILY 30 tablet 6  . Cyanocobalamin (B-12 PO) Take by mouth daily.    . cyclobenzaprine (FLEXERIL) 10 MG tablet Take 0.5 tablets (5 mg total) by mouth 3 (three) times daily as needed. For muscle spasm and pain 30 tablet 2  . FLUoxetine (PROZAC) 20 MG tablet TAKE TWO TABLETS BY MOUTH ONCE DAILY 180 tablet 3  . hydrochlorothiazide (HYDRODIURIL) 25 MG tablet TAKE ONE-HALF TO ONE TABLET BY MOUTH ONCE DAILY AS NEEDED 30 tablet 5  . meloxicam (MOBIC) 15 MG tablet Take 1 tablet (15 mg total) by mouth daily as needed for pain (with food). 30 tablet 4  . omeprazole (PRILOSEC) 20 MG capsule TAKE ONE CAPSULE BY MOUTH ONCE DAILY 90 capsule 3  . topiramate (TOPAMAX) 100 MG tablet TAKE ONE TABLET BY MOUTH ONCE DAILY 30 tablet 6  . VESICARE 10 MG tablet TAKE ONE TABLET BY MOUTH ONCE DAILY 90 tablet 3  . zolmitriptan (ZOMIG) 5 MG tablet TAKE ONE TABLET BY  MOUTH AS NEEDED FOR MIGRAINE (MAX 2/DAY) 10 tablet 1   No current facility-administered medications for this visit.    Allergies:   Atorvastatin and Oxybutynin    Social History:  The patient  reports that she has never smoked. She has never used smokeless tobacco. She reports that she drinks alcohol. She reports that she does not use illicit drugs.   Family History:  The patient's family history includes Arthritis in her father; Breast cancer in her maternal grandmother; Coronary artery disease in her father; Heart disease in her father; Prostate cancer in her father; Stroke in her father.    ROS:  Please see the history of present illness.    Review of Systems: Constitutional:  denies fever, chills, diaphoresis, appetite change and fatigue.  HEENT: denies photophobia, eye pain, redness, hearing loss, ear pain, congestion, sore throat, rhinorrhea, sneezing, neck pain, neck stiffness  and tinnitus.  Respiratory: denies SOB, DOE, cough, chest tightness, and wheezing.  Cardiovascular: admits to   palpitations    Gastrointestinal: denies nausea, vomiting, abdominal pain, diarrhea, constipation, blood in stool.  Genitourinary: denies dysuria, urgency, frequency, hematuria, flank pain and difficulty urinating.  Musculoskeletal: denies  myalgias, back pain, joint swelling, arthralgias and gait problem.   Skin: denies pallor, rash and wound.  Neurological: denies dizziness, seizures, syncope, weakness, light-headedness, numbness and headaches.   Hematological: denies adenopathy, easy bruising, personal or family bleeding history.  Psychiatric/ Behavioral: denies suicidal ideation, mood changes, confusion, nervousness, sleep disturbance and agitation.       All other systems are reviewed and negative.    PHYSICAL EXAM: VS:  BP 100/68 mmHg  Pulse 75  Ht 5\' 8"  (1.727 m)  Wt 98.431 kg (217 lb)  BMI 33.00 kg/m2  LMP 04/17/2011 , BMI Body mass index is 33 kg/(m^2). GEN: Well nourished, well developed, in no acute distress HEENT: normal Neck: no JVD, carotid bruits, or masses Cardiac: RRR; no murmurs, rubs, or gallops,no edema  Respiratory:  clear to auscultation bilaterally, normal work of breathing GI: soft, nontender, nondistended, + BS MS: no deformity or atrophy Skin: warm and dry, no rash Neuro:  Strength and sensation are intact Psych: normal   EKG:  EKG is ordered today. The ekg ordered today demonstrates NSR at 75.  Normal ECG    Recent Labs: 08/26/2014: ALT 18; BUN 16; Creatinine, Ser 0.8; Hemoglobin 12.9; Platelets 228.0; Potassium 3.4*; Sodium 141; TSH 2.33    Lipid Panel    Component Value Date/Time   CHOL 117 08/26/2014 0801   TRIG 82.0 08/26/2014 0801   HDL 37.40* 08/26/2014 0801   CHOLHDL 3 08/26/2014 0801   VLDL 16.4 08/26/2014 0801   LDLCALC 63 08/26/2014 0801   LDLDIRECT 182.2 08/25/2013 0814      Wt Readings from Last 3 Encounters:    07/14/15 98.431 kg (217 lb)  07/06/15 97.977 kg (216 lb)  06/20/15 97.637 kg (215 lb 4 oz)      Other studies Reviewed: Additional studies/ records that were reviewed today include: . Review of the above records demonstrates:    ASSESSMENT AND PLAN:  1.  Chest pressure: Her episodes of chest pressure or very atypical. They do not sound cardiac in origin. They last for hours at a time. I do not think that a stress Myoview study would help [place]. I've encouraged her to start a regular exercise program. If she's able to advance in her exercise program then I think that it is very unlikely that she would have any cardiac  problems. We'll have her come back and see me if she has any worsening chest pain or if she's not able to increase her exercise.   Current medicines are reviewed at length with the patient today.  The patient does not have concerns regarding medicines.  The following changes have been made:  no change  Labs/ tests ordered today include:  Orders Placed This Encounter  Procedures  . EKG 12-Lead     Disposition:   FU with me as needed.       Nahser, Wonda Cheng, MD  07/14/2015 11:30 AM    [facility] [place], [place], [place]  [address] Phone: [phone]; Fax: [phone]   [facility]  [address] [place], [place]  [address] [phone]   Fax [phone]

## 2015-07-26 ENCOUNTER — Ambulatory Visit: Payer: No Typology Code available for payment source | Admitting: Cardiovascular Disease

## 2015-08-18 ENCOUNTER — Encounter: Payer: Self-pay | Admitting: Internal Medicine

## 2015-08-18 NOTE — Assessment & Plan Note (Signed)
Doing fairly well with CPAP auto 5-15/[facility]. It sounds as if low pressure is sometimes insufficient for comfort. Plan-change pressure to auto 10-15

## 2015-09-14 ENCOUNTER — Other Ambulatory Visit (INDEPENDENT_AMBULATORY_CARE_PROVIDER_SITE_OTHER): Payer: Managed Care, Other (non HMO)

## 2015-09-14 DIAGNOSIS — Z23 Encounter for immunization: Secondary | ICD-10-CM

## 2015-09-14 DIAGNOSIS — R739 Hyperglycemia, unspecified: Secondary | ICD-10-CM

## 2015-09-14 LAB — HEMOGLOBIN A1C
Hgb A1c MFr Bld: 5.5 % (ref <5.7)
Mean Plasma Glucose: 111 mg/dL (ref <117)

## 2015-10-20 ENCOUNTER — Ambulatory Visit: Payer: Managed Care, Other (non HMO) | Admitting: Internal Medicine

## 2015-10-23 ENCOUNTER — Other Ambulatory Visit: Payer: Self-pay | Admitting: Family Medicine

## 2015-10-24 NOTE — Telephone Encounter (Signed)
Last refilled on 03/27/15 #30 with 6 additional refills, please advise

## 2015-10-24 NOTE — Telephone Encounter (Signed)
Please refill for a year  

## 2015-10-24 NOTE — Telephone Encounter (Signed)
done

## 2015-11-01 ENCOUNTER — Other Ambulatory Visit: Payer: Self-pay | Admitting: Family Medicine

## 2015-12-05 ENCOUNTER — Other Ambulatory Visit: Payer: Self-pay | Admitting: Family Medicine

## 2016-02-12 ENCOUNTER — Other Ambulatory Visit: Payer: Self-pay | Admitting: Family Medicine

## 2016-02-21 ENCOUNTER — Other Ambulatory Visit: Payer: Self-pay | Admitting: Family Medicine

## 2016-03-02 ENCOUNTER — Other Ambulatory Visit: Payer: Self-pay | Admitting: Family Medicine

## 2016-03-11 ENCOUNTER — Other Ambulatory Visit: Payer: Self-pay | Admitting: Family Medicine

## 2016-07-14 ENCOUNTER — Telehealth: Payer: Self-pay | Admitting: Family Medicine

## 2016-07-14 DIAGNOSIS — E538 Deficiency of other specified B group vitamins: Secondary | ICD-10-CM

## 2016-07-14 DIAGNOSIS — E559 Vitamin D deficiency, unspecified: Secondary | ICD-10-CM

## 2016-07-14 DIAGNOSIS — R739 Hyperglycemia, unspecified: Secondary | ICD-10-CM

## 2016-07-14 DIAGNOSIS — Z0000 Encounter for general adult medical examination without abnormal findings: Secondary | ICD-10-CM

## 2016-07-14 NOTE — Telephone Encounter (Signed)
-----   Message from Ellamae Sia sent at 07/09/2016  3:16 PM EDT ----- Regarding: Lab orders for Tuesday, 8.8.17 Patient is scheduled for CPX labs, please order future labs, Thanks , Karna Christmas

## 2016-07-16 ENCOUNTER — Other Ambulatory Visit (INDEPENDENT_AMBULATORY_CARE_PROVIDER_SITE_OTHER): Payer: Managed Care, Other (non HMO)

## 2016-07-16 DIAGNOSIS — E559 Vitamin D deficiency, unspecified: Secondary | ICD-10-CM | POA: Diagnosis not present

## 2016-07-16 DIAGNOSIS — E538 Deficiency of other specified B group vitamins: Secondary | ICD-10-CM

## 2016-07-16 DIAGNOSIS — Z0000 Encounter for general adult medical examination without abnormal findings: Secondary | ICD-10-CM

## 2016-07-16 DIAGNOSIS — R739 Hyperglycemia, unspecified: Secondary | ICD-10-CM

## 2016-07-16 LAB — LIPID PANEL
CHOLESTEROL: 123 mg/dL (ref 0–200)
HDL: 42.7 mg/dL (ref >39.00)
LDL CALC: 54 mg/dL (ref 0–99)
NonHDL: 80.19
TRIGLYCERIDES: 129 mg/dL (ref 0.0–149.0)
Total CHOL/HDL Ratio: 3
VLDL: 25.8 mg/dL (ref 0.0–40.0)

## 2016-07-16 LAB — COMPREHENSIVE METABOLIC PANEL
ALBUMIN: 4.2 g/dL (ref 3.5–5.2)
ALK PHOS: 66 U/L (ref 39–117)
ALT: 13 U/L (ref 0–35)
AST: 15 U/L (ref 0–37)
BILIRUBIN TOTAL: 0.6 mg/dL (ref 0.2–1.2)
BUN: 15 mg/dL (ref 6–23)
CALCIUM: 9 mg/dL (ref 8.4–10.5)
CO2: 28 mEq/L (ref 19–32)
CREATININE: 0.75 mg/dL (ref 0.40–1.20)
Chloride: 105 mEq/L (ref 96–112)
GFR: 83.96 mL/min (ref >60.00)
Glucose, Bld: 100 mg/dL — ABNORMAL HIGH (ref 70–99)
Potassium: 3.4 mEq/L — ABNORMAL LOW (ref 3.5–5.1)
Sodium: 142 mEq/L (ref 135–145)
TOTAL PROTEIN: 6.7 g/dL (ref 6.0–8.3)

## 2016-07-16 LAB — CBC WITH DIFFERENTIAL/PLATELET
BASOS ABS: 0 10*3/uL (ref 0.0–0.1)
Basophils Relative: 0.4 % (ref 0.0–3.0)
EOS PCT: 1.7 % (ref 0.0–5.0)
Eosinophils Absolute: 0.1 10*3/uL (ref 0.0–0.7)
HCT: 36.5 % (ref 36.0–46.0)
Hemoglobin: 12.4 g/dL (ref 12.0–15.0)
LYMPHS ABS: 1.2 10*3/uL (ref 0.7–4.0)
Lymphocytes Relative: 25.5 % (ref 12.0–46.0)
MCHC: 33.9 g/dL (ref 30.0–36.0)
MCV: 86 fl (ref 78.0–100.0)
MONOS PCT: 6.5 % (ref 3.0–12.0)
Monocytes Absolute: 0.3 10*3/uL (ref 0.1–1.0)
NEUTROS ABS: 3 10*3/uL (ref 1.4–7.7)
NEUTROS PCT: 65.9 % (ref 43.0–77.0)
PLATELETS: 216 10*3/uL (ref 150.0–400.0)
RBC: 4.24 Mil/uL (ref 3.87–5.11)
RDW: 14.3 % (ref 11.5–15.5)
WBC: 4.6 10*3/uL (ref 4.0–10.5)

## 2016-07-16 LAB — VITAMIN B12: VITAMIN B 12: 1008 pg/mL — AB (ref 211–911)

## 2016-07-16 LAB — HEMOGLOBIN A1C: Hgb A1c MFr Bld: 5.1 % (ref 4.6–6.5)

## 2016-07-16 LAB — VITAMIN D 25 HYDROXY (VIT D DEFICIENCY, FRACTURES): VITD: 40.68 ng/mL (ref 30.00–100.00)

## 2016-07-16 LAB — TSH: TSH: 2.26 u[IU]/mL (ref 0.35–4.50)

## 2016-07-19 ENCOUNTER — Telehealth: Payer: Self-pay | Admitting: *Deleted

## 2016-07-19 NOTE — Telephone Encounter (Signed)
PA for [facility] done over the phone and was approved. It was approved from today until 07/2017, they will fax over a confirmation/approval letter

## 2016-07-23 ENCOUNTER — Encounter: Payer: Self-pay | Admitting: Family Medicine

## 2016-07-23 ENCOUNTER — Ambulatory Visit (INDEPENDENT_AMBULATORY_CARE_PROVIDER_SITE_OTHER): Payer: Managed Care, Other (non HMO) | Admitting: Family Medicine

## 2016-07-23 VITALS — BP 110/72 | HR 69 | Temp 98.7°F | Ht 66.5 in | Wt 213.5 lb

## 2016-07-23 DIAGNOSIS — E538 Deficiency of other specified B group vitamins: Secondary | ICD-10-CM

## 2016-07-23 DIAGNOSIS — E785 Hyperlipidemia, unspecified: Secondary | ICD-10-CM | POA: Diagnosis not present

## 2016-07-23 DIAGNOSIS — E559 Vitamin D deficiency, unspecified: Secondary | ICD-10-CM | POA: Diagnosis not present

## 2016-07-23 DIAGNOSIS — Z0000 Encounter for general adult medical examination without abnormal findings: Secondary | ICD-10-CM | POA: Diagnosis not present

## 2016-07-23 DIAGNOSIS — R739 Hyperglycemia, unspecified: Secondary | ICD-10-CM

## 2016-07-23 MED ORDER — CYCLOBENZAPRINE HCL 10 MG PO TABS: 5.0000 mg | ORAL_TABLET | Freq: Three times a day (TID) | ORAL | 5 refills | Status: DC | PRN

## 2016-07-23 NOTE — Progress Notes (Signed)
Subjective:    Patient ID: Pamela Anthony, female    DOB: 1956/01/13, 60 y.o.   MRN: [id number]  HPI Here for health maintenance exam and to review chronic medical problems    Doing ok health wise   Lots of stress- mother and father are ill and father has alzheimers  Wt Readings from Last 3 Encounters:  07/23/16 213 lb 8 oz (96.8 kg)  07/14/15 217 lb (98.4 kg)  07/06/15 216 lb (98 kg)  down 4 lb  bmi is 33.9 -obese  Eating healthy  A little exercise -not enough  Frustrated by weight  No time for self care    Mammogram 12/15-normal - needs one at [facility] breast exam-no lumps  Flu shots- gets them every fall   Hep C/HIV screen- declined in the past   Pap 9/14-normal  No gyn provider  Declines pap  No new partners No symptoms   Colonoscopy 3/09-normal   Tetanus shot 9/15  Vit D level is 40.6-taking it  dexa 9/13 in normal range   Hx of B12 def Lab Results  Component Value Date   VITAMINB12 1,008 (H) 07/16/2016   Hx of hyperlipidemia Lab Results  Component Value Date   CHOL 123 07/16/2016   CHOL 117 08/26/2014   CHOL 127 10/12/2013   Lab Results  Component Value Date   HDL 42.70 07/16/2016   HDL 37.40 (L) 08/26/2014   HDL 45.50 10/12/2013   Lab Results  Component Value Date   LDLCALC 54 07/16/2016   LDLCALC 63 08/26/2014   LDLCALC 60 10/12/2013   Lab Results  Component Value Date   TRIG 129.0 07/16/2016   TRIG 82.0 08/26/2014   TRIG 106.0 10/12/2013   Lab Results  Component Value Date   CHOLHDL 3 07/16/2016   CHOLHDL 3 08/26/2014   CHOLHDL 3 10/12/2013   Lab Results  Component Value Date   LDLDIRECT 182.2 08/25/2013   LDLDIRECT 183.6 08/07/2012   LDLDIRECT 170.8 04/23/2011  on crestor and diet  Very well controlled   Hx of hyperlipidemia Lab Results  Component Value Date   HGBA1C 5.1 07/16/2016  doing well with diet control    Chemistry      Component Value Date/Time   NA 142 07/16/2016 0816   K 3.4 (L) 07/16/2016  0816   CL 105 07/16/2016 0816   CO2 28 07/16/2016 0816   BUN 15 07/16/2016 0816   CREATININE 0.75 07/16/2016 0816      Component Value Date/Time   CALCIUM 9.0 07/16/2016 0816   ALKPHOS 66 07/16/2016 0816   AST 15 07/16/2016 0816   ALT 13 07/16/2016 0816   BILITOT 0.6 07/16/2016 0816     eats fruit and green veg for K   Lab Results  Component Value Date   WBC 4.6 07/16/2016   HGB 12.4 07/16/2016   HCT 36.5 07/16/2016   MCV 86.0 07/16/2016   PLT 216.0 07/16/2016     Lab Results  Component Value Date   TSH 2.26 07/16/2016     Patient Active Problem List   Diagnosis Date Noted  . Palpitations 06/20/2015  . Globus sensation 06/20/2015  . Thoracic back pain 06/20/2015  . Chest discomfort 06/20/2015  . Hyperglycemia 03/16/2015  . Somnolence 01/16/2015  . Stress reaction 01/16/2015  . Neck pain 07/04/2014  . Encounter for routine gynecological examination 09/01/2013  . Depression with anxiety 01/18/2013  . Post-menopausal 08/14/2012  . Other screening mammogram 04/29/2011  . Routine general medical examination at  a health care facility 04/22/2011  . ARTHRALGIA 02/28/2010  . Vitamin D deficiency 03/08/2009  . IRRITABLE BOWEL SYNDROME 06/27/2008  . B12 deficiency 02/23/2008  . POLYP, COLON 03/06/2007  . Hyperlipidemia 03/06/2007  . ALLERGIC RHINITIS 03/06/2007  . GERD 03/06/2007  . HIATAL HERNIA 03/06/2007  . LOW BACK PAIN 03/06/2007  . OSA (obstructive sleep apnea) 03/06/2007  . URINARY INCONTINENCE 03/06/2007  . MIGRAINES, HX OF 03/06/2007   Past Medical History:  Diagnosis Date  . Allergic rhinitis   . B12 deficiency   . Degenerative disc disease    back  . Fungal infection 2005   lamasil   . GERD (gastroesophageal reflux disease)    EGD 10/2004  . Hemorrhoids, internal    colonoscopy 02/2008  . Hyperlipemia   . Iron deficiency anemia   . Low back pain   . Obesity   . Urinary incontinence   . Vitamin D deficiency    mild   Past Surgical History:    Procedure Laterality Date  . CESAREAN SECTION  1985  . DILATION AND CURETTAGE OF UTERUS  2005  . HAND SURGERY  2000   nodule removed  . KNEE SURGERY  1970's   right  . NASAL SINUS SURGERY  2000's   right maxillary  . TONSILLECTOMY AND ADENOIDECTOMY     Social History  Substance Use Topics  . Smoking status: Never Smoker  . Smokeless tobacco: Never Used  . Alcohol use 0.0 oz/week     Comment: occasional   Family History  Problem Relation Age of Onset  . Coronary artery disease Father     in his 59's  . Arthritis Father   . Stroke Father   . Heart disease Father     pacemaker  . Prostate cancer Father   . Breast cancer Maternal Grandmother    Allergies  Allergen Reactions  . Atorvastatin     REACTION: myalgias  . Oxybutynin     REACTION: rash   Current Outpatient Prescriptions on File Prior to Visit  Medication Sig Dispense Refill  . Calcium Carbonate-Vitamin D (CALTRATE 600+D) 600-400 MG-UNIT per tablet Take 1 tablet by mouth daily.      . cetirizine (ZYRTEC) 5 MG tablet Take 5 mg by mouth daily.    . Cholecalciferol (VITAMIN D-3 PO) Take 1 tablet by mouth daily.    . Cyanocobalamin (B-12 PO) Take by mouth daily.    . cyclobenzaprine (FLEXERIL) 10 MG tablet Take 0.5 tablets (5 mg total) by mouth 3 (three) times daily as needed. For muscle spasm and pain 30 tablet 2  . FLUoxetine (PROZAC) 20 MG tablet TAKE TWO TABLETS BY MOUTH ONCE DAILY 180 tablet 1  . hydrochlorothiazide (HYDRODIURIL) 25 MG tablet TAKE ONE-HALF TO ONE TABLET BY MOUTH ONCE DAILY AS NEEDED 30 tablet 5  . meloxicam (MOBIC) 15 MG tablet Take 1 tablet (15 mg total) by mouth daily as needed for pain (with food). 30 tablet 4  . omeprazole (PRILOSEC) 20 MG capsule TAKE ONE CAPSULE BY MOUTH ONCE DAILY 90 capsule 1  . rosuvastatin (CRESTOR) 10 MG tablet TAKE ONE TABLET BY MOUTH ONCE DAILY 30 tablet 5  . VESICARE 10 MG tablet TAKE ONE TABLET BY MOUTH ONCE DAILY 90 tablet 1  . zolmitriptan (ZOMIG) 5 MG tablet  TAKE ONE TABLET BY MOUTH AS NEEDED FOR MIGRAINE (MAX 2/DAY) 10 tablet 1   No current facility-administered medications on file prior to visit.     Review of Systems    Review  of Systems  Constitutional: Negative for fever, appetite change,  and unexpected weight change.  Eyes: Negative for pain and visual disturbance.  Respiratory: Negative for cough and shortness of breath.   Cardiovascular: Negative for cp or palpitations    Gastrointestinal: Negative for nausea, diarrhea and constipation.  Genitourinary: Negative for urgency and frequency.  Skin: Negative for pallor or rash   Neurological: Negative for weakness, light-headedness, numbness and headaches.  Hematological: Negative for adenopathy. Does not bruise/bleed easily.  Psychiatric/Behavioral: Negative for dysphoric mood. The patient is not nervous/anxious.      Objective:   Physical Exam  Constitutional: She appears well-developed and well-nourished. No distress.  obese and well appearing   HENT:  Head: Normocephalic and atraumatic.  Right Ear: External ear normal.  Left Ear: External ear normal.  Mouth/Throat: Oropharynx is clear and moist.  Nares are boggy  Eyes: Conjunctivae and EOM are normal. Pupils are equal, round, and reactive to light. No scleral icterus.  Neck: Normal range of motion. Neck supple. No JVD present. Carotid bruit is not present. No thyromegaly present.  Cardiovascular: Normal rate, regular rhythm, normal heart sounds and intact distal pulses.  Exam reveals no gallop.   Pulmonary/Chest: Effort normal and breath sounds normal. No respiratory distress. She has no wheezes. She exhibits no tenderness.  Abdominal: Soft. Bowel sounds are normal. She exhibits no distension, no abdominal bruit and no mass. There is no tenderness.  Genitourinary: No breast swelling, tenderness, discharge or bleeding.  Genitourinary Comments: Breast exam: No mass, nodules, thickening, tenderness, bulging, retraction,  inflamation, nipple discharge or skin changes noted.  No axillary or clavicular LA.      Musculoskeletal: Normal range of motion. She exhibits no edema or tenderness.  Lymphadenopathy:    She has no cervical adenopathy.  Neurological: She is alert. She has normal reflexes. No cranial nerve deficit. She exhibits normal muscle tone. Coordination normal.  Skin: Skin is warm and dry. No rash noted. No erythema. No pallor.  Lentigines diffusely  Psychiatric: She has a normal mood and affect.          Assessment & Plan:   Problem List Items Addressed This Visit      Digestive   B12 deficiency - Primary    Lab Results  Component Value Date   VITAMINB12 1,008 (H) 07/16/2016   Will continue current supplementation         Other   Vitamin D deficiency    Vitamin D level is therapeutic with current supplementation Disc importance of this to bone and overall health       Routine general medical examination at a health care facility    Reviewed health habits including diet and exercise and skin cancer prevention Reviewed appropriate screening tests for age  Also reviewed health mt list, fam hx and immunization status , as well as social and family history   See HPI Labs reviewed Wt loss enc Don't forget to schedule your mammogram - you are overdue  Get your flu shot in the fall Take care of yourself  Try to fit in exercise regularly as much as you can       Hyperlipidemia    Disc goals for lipids and reasons to control them Rev labs with pt Rev low sat fat diet in detail Continue crestor       Hyperglycemia    Lab Results  Component Value Date   HGBA1C 5.1 07/16/2016   Doing well with diet control Low glycemic diet and wt  loss enc       Other Visit Diagnoses   None.

## 2016-07-23 NOTE — Patient Instructions (Addendum)
Don't forget to schedule your mammogram - you are overdue  Get your flu shot in the fall Take care of yourself  Try to fit in exercise regularly as much as you can

## 2016-07-23 NOTE — Progress Notes (Signed)
Pre visit review using our clinic review tool, if applicable. No additional management support is needed unless otherwise documented below in the visit note. 

## 2016-07-24 ENCOUNTER — Other Ambulatory Visit: Payer: Self-pay | Admitting: Family Medicine

## 2016-07-24 DIAGNOSIS — Z1231 Encounter for screening mammogram for malignant neoplasm of breast: Secondary | ICD-10-CM

## 2016-07-25 NOTE — Assessment & Plan Note (Signed)
Disc goals for lipids and reasons to control them Rev labs with pt Rev low sat fat diet in detail Continue crestor

## 2016-07-25 NOTE — Assessment & Plan Note (Signed)
Vitamin D level is therapeutic with current supplementation Disc importance of this to bone and overall health  

## 2016-07-25 NOTE — Assessment & Plan Note (Signed)
Lab Results  Component Value Date   VITAMINB12 1,008 (H) 07/16/2016   Will continue current supplementation

## 2016-07-25 NOTE — Assessment & Plan Note (Signed)
Lab Results  Component Value Date   HGBA1C 5.1 07/16/2016   Doing well with diet control Low glycemic diet and wt loss enc

## 2016-07-25 NOTE — Assessment & Plan Note (Signed)
Reviewed health habits including diet and exercise and skin cancer prevention Reviewed appropriate screening tests for age  Also reviewed health mt list, fam hx and immunization status , as well as social and family history   See HPI Labs reviewed Wt loss enc Don't forget to schedule your mammogram - you are overdue  Get your flu shot in the fall Take care of yourself  Try to fit in exercise regularly as much as you can

## 2016-08-08 ENCOUNTER — Ambulatory Visit: Payer: Managed Care, Other (non HMO)

## 2016-08-21 ENCOUNTER — Ambulatory Visit
Admission: RE | Admit: 2016-08-21 | Discharge: 2016-08-21 | Disposition: A | Discharge status: Home / Self Care | Payer: Managed Care, Other (non HMO) | Source: Ambulatory Visit | Attending: Family Medicine | Admitting: Family Medicine

## 2016-08-21 DIAGNOSIS — Z1231 Encounter for screening mammogram for malignant neoplasm of breast: Secondary | ICD-10-CM | POA: Diagnosis present

## 2016-08-23 ENCOUNTER — Other Ambulatory Visit: Payer: Self-pay | Admitting: Family Medicine

## 2016-09-05 ENCOUNTER — Other Ambulatory Visit: Payer: Self-pay | Admitting: Family Medicine

## 2016-09-10 ENCOUNTER — Ambulatory Visit (INDEPENDENT_AMBULATORY_CARE_PROVIDER_SITE_OTHER): Payer: Managed Care, Other (non HMO)

## 2016-09-10 DIAGNOSIS — Z23 Encounter for immunization: Secondary | ICD-10-CM | POA: Diagnosis not present

## 2016-09-12 ENCOUNTER — Other Ambulatory Visit: Payer: Self-pay | Admitting: Family Medicine

## 2016-12-23 ENCOUNTER — Telehealth: Payer: Self-pay | Admitting: *Deleted

## 2016-12-23 MED ORDER — DARIFENACIN HYDROBROMIDE ER 15 MG PO TB24: 15.0000 mg | ORAL_TABLET | Freq: Every day | ORAL | 11 refills | Status: DC

## 2016-12-23 NOTE — Telephone Encounter (Signed)
Patient called stating that her insurance will no longer cover Vesicare and she needs a refill. Patient stated that it looks like her insurance will cover Darifenacin ER 15 mg.

## 2016-12-23 NOTE — Telephone Encounter (Signed)
(  per DPR) left voicemail letting pt know Rx sent and to update [place] of any issues or side eff

## 2016-12-23 NOTE — Telephone Encounter (Signed)
I sent it to her [facility] on file Update [place] if any problems or side effects thanks

## 2017-02-12 ENCOUNTER — Other Ambulatory Visit: Payer: Self-pay | Admitting: Family Medicine

## 2017-06-04 ENCOUNTER — Other Ambulatory Visit: Payer: Self-pay | Admitting: Family Medicine

## 2017-07-21 ENCOUNTER — Telehealth: Payer: Self-pay | Admitting: Family Medicine

## 2017-07-21 DIAGNOSIS — E559 Vitamin D deficiency, unspecified: Secondary | ICD-10-CM

## 2017-07-21 DIAGNOSIS — E7800 Pure hypercholesterolemia, unspecified: Secondary | ICD-10-CM

## 2017-07-21 DIAGNOSIS — E538 Deficiency of other specified B group vitamins: Secondary | ICD-10-CM

## 2017-07-21 DIAGNOSIS — Z0000 Encounter for general adult medical examination without abnormal findings: Secondary | ICD-10-CM

## 2017-07-21 DIAGNOSIS — R739 Hyperglycemia, unspecified: Secondary | ICD-10-CM

## 2017-07-21 NOTE — Telephone Encounter (Signed)
-----   Message from Ellamae Sia sent at 07/17/2017 10:16 AM EDT ----- Regarding: Lab orders for Tuesday, 8.14.18 Patient is scheduled for CPX labs, please order future labs, Thanks , Karna Christmas

## 2017-07-22 ENCOUNTER — Other Ambulatory Visit (INDEPENDENT_AMBULATORY_CARE_PROVIDER_SITE_OTHER): Payer: 59

## 2017-07-22 DIAGNOSIS — Z0000 Encounter for general adult medical examination without abnormal findings: Secondary | ICD-10-CM | POA: Diagnosis not present

## 2017-07-22 DIAGNOSIS — E7800 Pure hypercholesterolemia, unspecified: Secondary | ICD-10-CM

## 2017-07-22 DIAGNOSIS — R739 Hyperglycemia, unspecified: Secondary | ICD-10-CM | POA: Diagnosis not present

## 2017-07-22 DIAGNOSIS — E538 Deficiency of other specified B group vitamins: Secondary | ICD-10-CM

## 2017-07-22 DIAGNOSIS — E559 Vitamin D deficiency, unspecified: Secondary | ICD-10-CM | POA: Diagnosis not present

## 2017-07-22 LAB — CBC WITH DIFFERENTIAL/PLATELET
BASOS ABS: 0 10*3/uL (ref 0.0–0.1)
BASOS PCT: 1 % (ref 0.0–3.0)
EOS ABS: 0.1 10*3/uL (ref 0.0–0.7)
Eosinophils Relative: 1.6 % (ref 0.0–5.0)
HCT: 38.4 % (ref 36.0–46.0)
HEMOGLOBIN: 12.8 g/dL (ref 12.0–15.0)
Lymphocytes Relative: 25 % (ref 12.0–46.0)
Lymphs Abs: 1.1 10*3/uL (ref 0.7–4.0)
MCHC: 33.2 g/dL (ref 30.0–36.0)
MCV: 88.6 fl (ref 78.0–100.0)
MONO ABS: 0.3 10*3/uL (ref 0.1–1.0)
Monocytes Relative: 7.5 % (ref 3.0–12.0)
Neutro Abs: 2.8 10*3/uL (ref 1.4–7.7)
Neutrophils Relative %: 64.9 % (ref 43.0–77.0)
Platelets: 241 10*3/uL (ref 150.0–400.0)
RBC: 4.34 Mil/uL (ref 3.87–5.11)
RDW: 13.8 % (ref 11.5–15.5)
WBC: 4.2 10*3/uL (ref 4.0–10.5)

## 2017-07-22 LAB — VITAMIN D 25 HYDROXY (VIT D DEFICIENCY, FRACTURES): VITD: 34.23 ng/mL (ref 30.00–100.00)

## 2017-07-22 LAB — COMPREHENSIVE METABOLIC PANEL
ALBUMIN: 4.5 g/dL (ref 3.5–5.2)
ALK PHOS: 69 U/L (ref 39–117)
ALT: 19 U/L (ref 0–35)
AST: 19 U/L (ref 0–37)
BILIRUBIN TOTAL: 0.6 mg/dL (ref 0.2–1.2)
BUN: 15 mg/dL (ref 6–23)
CO2: 28 mEq/L (ref 19–32)
CREATININE: 0.76 mg/dL (ref 0.40–1.20)
Calcium: 9.2 mg/dL (ref 8.4–10.5)
Chloride: 105 mEq/L (ref 96–112)
GFR: 82.4 mL/min (ref >60.00)
Glucose, Bld: 103 mg/dL — ABNORMAL HIGH (ref 70–99)
Potassium: 3.6 mEq/L (ref 3.5–5.1)
SODIUM: 142 meq/L (ref 135–145)
TOTAL PROTEIN: 6.5 g/dL (ref 6.0–8.3)

## 2017-07-22 LAB — VITAMIN B12: Vitamin B-12: 1473 pg/mL — ABNORMAL HIGH (ref 211–911)

## 2017-07-22 LAB — LIPID PANEL
CHOLESTEROL: 130 mg/dL (ref 0–200)
HDL: 46.2 mg/dL (ref >39.00)
LDL Cholesterol: 58 mg/dL (ref 0–99)
NonHDL: 83.69
Total CHOL/HDL Ratio: 3
Triglycerides: 126 mg/dL (ref 0.0–149.0)
VLDL: 25.2 mg/dL (ref 0.0–40.0)

## 2017-07-22 LAB — HEMOGLOBIN A1C: HEMOGLOBIN A1C: 5.1 % (ref 4.6–6.5)

## 2017-07-22 LAB — TSH: TSH: 3.82 u[IU]/mL (ref 0.35–4.50)

## 2017-07-29 ENCOUNTER — Ambulatory Visit (INDEPENDENT_AMBULATORY_CARE_PROVIDER_SITE_OTHER): Payer: 59 | Admitting: Family Medicine

## 2017-07-29 ENCOUNTER — Encounter: Payer: Self-pay | Admitting: Family Medicine

## 2017-07-29 VITALS — BP 128/70 | HR 73 | Temp 98.2°F | Ht 66.25 in | Wt 219.5 lb

## 2017-07-29 DIAGNOSIS — E7800 Pure hypercholesterolemia, unspecified: Secondary | ICD-10-CM

## 2017-07-29 DIAGNOSIS — E538 Deficiency of other specified B group vitamins: Secondary | ICD-10-CM

## 2017-07-29 DIAGNOSIS — F418 Other specified anxiety disorders: Secondary | ICD-10-CM

## 2017-07-29 DIAGNOSIS — R32 Unspecified urinary incontinence: Secondary | ICD-10-CM | POA: Diagnosis not present

## 2017-07-29 DIAGNOSIS — Z0000 Encounter for general adult medical examination without abnormal findings: Secondary | ICD-10-CM | POA: Diagnosis not present

## 2017-07-29 DIAGNOSIS — E559 Vitamin D deficiency, unspecified: Secondary | ICD-10-CM | POA: Diagnosis not present

## 2017-07-29 DIAGNOSIS — R739 Hyperglycemia, unspecified: Secondary | ICD-10-CM

## 2017-07-29 MED ORDER — ROSUVASTATIN CALCIUM 10 MG PO TABS: 10.0000 mg | ORAL_TABLET | Freq: Every day | ORAL | 3 refills | Status: DC

## 2017-07-29 MED ORDER — OMEPRAZOLE 20 MG PO CPDR: 20.0000 mg | DELAYED_RELEASE_CAPSULE | Freq: Every day | ORAL | 3 refills | Status: DC

## 2017-07-29 MED ORDER — OXYBUTYNIN CHLORIDE ER 10 MG PO TB24: 10.0000 mg | ORAL_TABLET | Freq: Every day | ORAL | 3 refills | Status: DC

## 2017-07-29 MED ORDER — FLUOXETINE HCL 20 MG PO TABS: 40.0000 mg | ORAL_TABLET | Freq: Every day | ORAL | 3 refills | Status: DC

## 2017-07-29 MED ORDER — HYDROCHLOROTHIAZIDE 25 MG PO TABS: ORAL_TABLET | ORAL | 3 refills | Status: DC

## 2017-07-29 NOTE — Patient Instructions (Addendum)
For weight loss and better health  Try to get most of your carbohydrates from produce (with the exception of white potatoes)  Eat less bread/pasta/rice/snack foods/cereals/sweets and other items from the middle of the grocery store (processed carbs)  Don't forget to get your flu shot in the fall   You are due for a mammogram next month-do not forget to schedule that   Your colonoscopy is due 3/19 - do not forget to schedule that / you should get a letter but let us know if you need help scheduling it   Take care of yourself

## 2017-07-29 NOTE — Progress Notes (Signed)
Subjective:    Patient ID: Pamela Anthony, female    DOB: 06/12/1956, 61 y.o.   MRN: [id number]  HPI Here for health maintenance exam and to review chronic medical problems    Summer was ok  Feeling ok   Dad passed away in 2023-02-06 - it has been rough  Mother is doing ok overall  A little less on her plate except for grief  She has a lot of support  Dep/anx is stable for the most part  Still takes fluoxetine  Outlook has changed     Wt Readings from Last 3 Encounters:  07/29/17 219 lb 8 oz (99.6 kg)  07/23/16 213 lb 8 oz (96.8 kg)  07/14/15 217 lb (98.4 kg)  exercise - walking  Diet -eating healthy / usually does  35.16 kg/m  Pap 9/14- she has not been sexually active (husband had medical problems)  No hx of abn pap No symptoms  No new partners  She would rather not do a pap or gyn exam   Flu shot-will get in the fall   Mammogram 9/17 Self breast exam -no breast lumps   Colonoscopy 3/09 Recall is 3/19 -nl   Tdap 9/15  zostavax 10/15 Interested in shingrix when available   Urine incontinence  She takes enablex  Now her ins changed coverage again  oxybutinin is the only one covered    Vit D def - level is 34.2    B12 def Lab Results  Component Value Date   VITAMINB12 1,473 (H) 07/22/2017    BP Readings from Last 3 Encounters:  07/29/17 128/70  07/23/16 110/72  07/14/15 100/68   Pulse Readings from Last 3 Encounters:  07/29/17 73  07/23/16 69  07/14/15 75    Hyperglycemia Lab Results  Component Value Date   HGBA1C 5.1 07/22/2017   Hyperlipidemia Lab Results  Component Value Date   CHOL 130 07/22/2017   CHOL 123 07/16/2016   CHOL 117 08/26/2014   Lab Results  Component Value Date   HDL 46.20 07/22/2017   HDL 42.70 07/16/2016   HDL 37.40 (L) 08/26/2014   Lab Results  Component Value Date   LDLCALC 58 07/22/2017   LDLCALC 54 07/16/2016   LDLCALC 63 08/26/2014   Lab Results  Component Value Date   TRIG 126.0 07/22/2017   TRIG 129.0 07/16/2016   TRIG 82.0 08/26/2014   Lab Results  Component Value Date   CHOLHDL 3 07/22/2017   CHOLHDL 3 07/16/2016   CHOLHDL 3 08/26/2014   Lab Results  Component Value Date   LDLDIRECT 182.2 08/25/2013   LDLDIRECT 183.6 08/07/2012   LDLDIRECT 170.8 04/23/2011   crestor and diet  Very well controlled   Lab Results  Component Value Date   WBC 4.2 07/22/2017   HGB 12.8 07/22/2017   HCT 38.4 07/22/2017   MCV 88.6 07/22/2017   PLT 241.0 07/22/2017    Lab Results  Component Value Date   CREATININE 0.76 07/22/2017   BUN 15 07/22/2017   NA 142 07/22/2017   K 3.6 07/22/2017   CL 105 07/22/2017   CO2 28 07/22/2017   Lab Results  Component Value Date   ALT 19 07/22/2017   AST 19 07/22/2017   ALKPHOS 69 07/22/2017   BILITOT 0.6 07/22/2017    Lab Results  Component Value Date   TSH 3.82 07/22/2017     Review of Systems Review of Systems  Constitutional: Negative for fever, appetite change, fatigue and unexpected weight change.  Eyes:  Negative for pain and visual disturbance.  Respiratory: Negative for cough and shortness of breath.   Cardiovascular: Negative for cp or palpitations    Gastrointestinal: Negative for nausea, diarrhea and constipation.  Genitourinary: Negative for urgency and frequency.  Skin: Negative for pallor or rash   MSK achey and stiff hands -worse the more she does/ no swelling , her legs ache occ as well  Neurological: Negative for weakness, light-headedness, numbness and headaches.  Hematological: Negative for adenopathy. Does not bruise/bleed easily.  Psychiatric/Behavioral: Negative for dysphoric mood. The patient is not nervous/anxious.         Objective:   Physical Exam  Constitutional: She appears well-developed and well-nourished. No distress.  obese and well appearing   HENT:  Head: Normocephalic and atraumatic.  Right Ear: External ear normal.  Left Ear: External ear normal.  Mouth/Throat: Oropharynx is clear and  moist.  Eyes: Pupils are equal, round, and reactive to light. Conjunctivae and EOM are normal. No scleral icterus.  Neck: Normal range of motion. Neck supple. No JVD present. Carotid bruit is not present. No thyromegaly present.  Cardiovascular: Normal rate, regular rhythm, normal heart sounds and intact distal pulses.  Exam reveals no gallop.   Pulmonary/Chest: Effort normal and breath sounds normal. No respiratory distress. She has no wheezes. She exhibits no tenderness.  Abdominal: Soft. Bowel sounds are normal. She exhibits no distension, no abdominal bruit and no mass. There is no tenderness.  Genitourinary: No breast swelling, tenderness, discharge or bleeding.  Genitourinary Comments: Breast exam: No mass, nodules, thickening, tenderness, bulging, retraction, inflamation, nipple discharge or skin changes noted.  No axillary or clavicular LA.      Musculoskeletal: Normal range of motion. She exhibits no edema or tenderness.  No kyphosis   Lymphadenopathy:    She has no cervical adenopathy.  Neurological: She is alert. She has normal reflexes. No cranial nerve deficit. She exhibits normal muscle tone. Coordination normal.  Skin: Skin is warm and dry. No rash noted. No erythema. No pallor.  Solar lentigines diffusely   Psychiatric: She has a normal mood and affect.          Assessment & Plan:   Problem List Items Addressed This Visit      Other   B12 deficiency - Primary    Lab Results  Component Value Date   VITAMINB12 1,473 (H) 07/22/2017   She will cut back on oral dosing       Depression with anxiety    Given grief and los of father actually doing pretty well  Do not advise getting off fluoxetine at this time Reviewed stressors/ coping techniques/symptoms/ support sources/ tx options and side effects in detail today Good support  Declines counseling      Hyperglycemia    Lab Results  Component Value Date   HGBA1C 5.1 07/22/2017   Stable disc imp of low  glycemic diet and wt loss to prevent DM2       Hyperlipidemia    Disc goals for lipids and reasons to control them Rev labs with pt Rev low sat fat diet in detail Well controlled on crestor and diet       Relevant Medications   rosuvastatin (CRESTOR) 10 MG tablet   hydrochlorothiazide (HYDRODIURIL) 25 MG tablet   Routine general medical examination at a health care facility    Reviewed health habits including diet and exercise and skin cancer prevention Reviewed appropriate screening tests for age  Also reviewed health mt list, fam hx  and immunization status , as well as social and family history   See HPI Labs reviewed  Enc wt loss/good health havits Doing well with grief  Colonoscopy due 3/19 Flu shot in the fall  Mammogram next mo-she will schedule She declines gyn exam today      URINARY INCONTINENCE    Due to ins change from enablex to oxybutinin 10 xl daily  Update re: resp and side eff      Relevant Medications   oxybutynin (DITROPAN-XL) 10 MG 24 hr tablet   Vitamin D deficiency    Vitamin D level is therapeutic with current supplementation Disc importance of this to bone and overall health

## 2017-07-29 NOTE — Assessment & Plan Note (Signed)
Given grief and los of father actually doing pretty well  Do not advise getting off fluoxetine at this time Reviewed stressors/ coping techniques/symptoms/ support sources/ tx options and side effects in detail today Good support  Declines counseling

## 2017-07-29 NOTE — Assessment & Plan Note (Signed)
Reviewed health habits including diet and exercise and skin cancer prevention Reviewed appropriate screening tests for age  Also reviewed health mt list, fam hx and immunization status , as well as social and family history   See HPI Labs reviewed  Enc wt loss/good health havits Doing well with grief  Colonoscopy due 3/19 Flu shot in the fall  Mammogram next mo-she will schedule She declines gyn exam today

## 2017-07-29 NOTE — Assessment & Plan Note (Signed)
Lab Results  Component Value Date   VITAMINB12 1,473 (H) 07/22/2017   She will cut back on oral dosing

## 2017-07-29 NOTE — Assessment & Plan Note (Signed)
Due to ins change from enablex to oxybutinin 10 xl daily  Update re: resp and side eff

## 2017-07-29 NOTE — Assessment & Plan Note (Signed)
Vitamin D level is therapeutic with current supplementation Disc importance of this to bone and overall health  

## 2017-07-29 NOTE — Assessment & Plan Note (Signed)
Disc goals for lipids and reasons to control them Rev labs with pt Rev low sat fat diet in detail Well controlled on crestor and diet

## 2017-07-29 NOTE — Assessment & Plan Note (Signed)
Lab Results  Component Value Date   HGBA1C 5.1 07/22/2017   Stable disc imp of low glycemic diet and wt loss to prevent DM2

## 2017-08-22 ENCOUNTER — Other Ambulatory Visit: Payer: Self-pay | Admitting: Family Medicine

## 2017-08-22 DIAGNOSIS — Z1231 Encounter for screening mammogram for malignant neoplasm of breast: Secondary | ICD-10-CM

## 2017-09-03 ENCOUNTER — Ambulatory Visit
Admission: RE | Admit: 2017-09-03 | Discharge: 2017-09-03 | Disposition: A | Discharge status: Home / Self Care | Payer: 59 | Source: Ambulatory Visit | Attending: Family Medicine | Admitting: Family Medicine

## 2017-09-03 DIAGNOSIS — Z1231 Encounter for screening mammogram for malignant neoplasm of breast: Secondary | ICD-10-CM | POA: Diagnosis present

## 2017-09-09 ENCOUNTER — Ambulatory Visit (INDEPENDENT_AMBULATORY_CARE_PROVIDER_SITE_OTHER): Payer: 59

## 2017-09-09 DIAGNOSIS — Z23 Encounter for immunization: Secondary | ICD-10-CM | POA: Diagnosis not present

## 2018-03-11 ENCOUNTER — Telehealth: Payer: Self-pay | Admitting: Internal Medicine

## 2018-03-11 NOTE — Telephone Encounter (Signed)
lmtcb x1 for pt. 

## 2018-03-11 NOTE — Telephone Encounter (Signed)
Called patient, she states that she is wanting her records of all information with [facility] sent over to [facility] as well as an ENT specialist in [place].    I have advised patient that she can contact our medical records to get all information she needs so that it can be given to them. Number for medical records was given. Nothing further needed.

## 2018-03-11 NOTE — Telephone Encounter (Signed)
Pt is calling back [phone]

## 2018-03-16 ENCOUNTER — Telehealth: Payer: Self-pay | Admitting: Internal Medicine

## 2018-03-16 NOTE — Telephone Encounter (Signed)
I do not see where anyone from our office has contacted this patient.    lmtcb

## 2018-03-17 NOTE — Telephone Encounter (Signed)
This was an old message from the previous message in the chart. This was already handled. Nothing further is needed.

## 2018-03-18 ENCOUNTER — Encounter: Payer: Self-pay | Admitting: Gastroenterology

## 2018-03-18 ENCOUNTER — Encounter: Payer: Self-pay | Admitting: Family Medicine

## 2018-03-18 ENCOUNTER — Ambulatory Visit: Payer: 59 | Admitting: Family Medicine

## 2018-03-18 VITALS — BP 126/82 | HR 72 | Temp 98.0°F | Ht 66.25 in | Wt 218.8 lb

## 2018-03-18 DIAGNOSIS — Z1211 Encounter for screening for malignant neoplasm of colon: Secondary | ICD-10-CM | POA: Diagnosis not present

## 2018-03-18 DIAGNOSIS — G4733 Obstructive sleep apnea (adult) (pediatric): Secondary | ICD-10-CM

## 2018-03-18 DIAGNOSIS — E538 Deficiency of other specified B group vitamins: Secondary | ICD-10-CM | POA: Diagnosis not present

## 2018-03-18 DIAGNOSIS — R739 Hyperglycemia, unspecified: Secondary | ICD-10-CM

## 2018-03-18 DIAGNOSIS — R5382 Chronic fatigue, unspecified: Secondary | ICD-10-CM

## 2018-03-18 DIAGNOSIS — R002 Palpitations: Secondary | ICD-10-CM

## 2018-03-18 DIAGNOSIS — R5383 Other fatigue: Secondary | ICD-10-CM | POA: Insufficient documentation

## 2018-03-18 LAB — COMPREHENSIVE METABOLIC PANEL
ALBUMIN: 4.4 g/dL (ref 3.5–5.2)
ALK PHOS: 65 U/L (ref 39–117)
ALT: 20 U/L (ref 0–35)
AST: 21 U/L (ref 0–37)
BILIRUBIN TOTAL: 0.6 mg/dL (ref 0.2–1.2)
BUN: 17 mg/dL (ref 6–23)
CALCIUM: 9.2 mg/dL (ref 8.4–10.5)
CO2: 30 meq/L (ref 19–32)
Chloride: 102 mEq/L (ref 96–112)
Creatinine, Ser: 0.7 mg/dL (ref 0.40–1.20)
GFR: 90.4 mL/min (ref >60.00)
Glucose, Bld: 80 mg/dL (ref 70–99)
Potassium: 3.5 mEq/L (ref 3.5–5.1)
Sodium: 140 mEq/L (ref 135–145)
Total Protein: 7.1 g/dL (ref 6.0–8.3)

## 2018-03-18 LAB — CBC WITH DIFFERENTIAL/PLATELET
BASOS ABS: 0 10*3/uL (ref 0.0–0.1)
BASOS PCT: 0.8 % (ref 0.0–3.0)
Eosinophils Absolute: 0.1 10*3/uL (ref 0.0–0.7)
Eosinophils Relative: 1.8 % (ref 0.0–5.0)
HCT: 37.6 % (ref 36.0–46.0)
HEMOGLOBIN: 12.7 g/dL (ref 12.0–15.0)
Lymphocytes Relative: 25.7 % (ref 12.0–46.0)
Lymphs Abs: 1.3 10*3/uL (ref 0.7–4.0)
MCHC: 33.9 g/dL (ref 30.0–36.0)
MCV: 87.2 fl (ref 78.0–100.0)
MONOS PCT: 7.1 % (ref 3.0–12.0)
Monocytes Absolute: 0.4 10*3/uL (ref 0.1–1.0)
NEUTROS ABS: 3.3 10*3/uL (ref 1.4–7.7)
Neutrophils Relative %: 64.6 % (ref 43.0–77.0)
PLATELETS: 234 10*3/uL (ref 150.0–400.0)
RBC: 4.31 Mil/uL (ref 3.87–5.11)
RDW: 14.2 % (ref 11.5–15.5)
WBC: 5.1 10*3/uL (ref 4.0–10.5)

## 2018-03-18 LAB — TSH: TSH: 1.7 u[IU]/mL (ref 0.35–4.50)

## 2018-03-18 LAB — HEMOGLOBIN A1C: HEMOGLOBIN A1C: 5.2 % (ref 4.6–6.5)

## 2018-03-18 LAB — VITAMIN B12: VITAMIN B 12: 594 pg/mL (ref 211–911)

## 2018-03-18 NOTE — Assessment & Plan Note (Signed)
Worse/with malaise  Also some palpitations /sinus issues and shaky episodes Labs today  Stop herbal supplement drink  Plan from there

## 2018-03-18 NOTE — Assessment & Plan Note (Signed)
Trying nasal pillows with next mask  Also considering nasal surgery- for dev septum

## 2018-03-18 NOTE — Assessment & Plan Note (Signed)
A1C today  Pt is fatigued  Diet is fair  No reg exercise

## 2018-03-18 NOTE — Assessment & Plan Note (Signed)
Inc in fatigue Level today

## 2018-03-18 NOTE — Patient Instructions (Signed)
Labs today   Refer for colonoscopy and cardiac visit   Follow thought with ENT plan

## 2018-03-18 NOTE — Assessment & Plan Note (Signed)
Due for colonoscopy (states she did not get a reminder) Ref for this

## 2018-03-18 NOTE — Progress Notes (Signed)
Subjective:    Patient ID: Pamela Anthony, female    DOB: 11/07/1956, 62 y.o.   MRN: [id number]  HPI Here for fatigue/malaise and palpitations  Also facial pressure   Wt Readings from Last 3 Encounters:  03/18/18 218 lb 12 oz (99.2 kg)  07/29/17 219 lb 8 oz (99.6 kg)  07/23/16 213 lb 8 oz (96.8 kg)   35.04 kg/m   Would like to do some blood work  She is very concerned  Feels odd and different Very tired  For about 2 months worse    She saw Dr Tami Ribas for her sinuses  Was thinking about fixing her dev septum - and will have a consult with Dr Nadeen Landau for a special procedure  This may help her breathe   She falls asleep easily  Has OSA_ cpap is working- getting a new mask soon  May also try nasal pillow to open her up   Did wake up with severe acid reflux one night -then that makes her cough  She takes prilosec 20 mg  Taking apple cider vinegar tabs- and that helps  Happens twice per month average  Does not correlate with diet at all or time that she eats   occ spasm in the back of her head when she turns her head - lasting moments /fleeting  No migraines lately   She feels a flutter - from L chest to her throat  Seems like all the time  She saw cardiology for palpitations in the past-but this feels different Would like to go back  Chest "area" feels tight on and off/occasionally -not exertional/no pain   Stress- no more than usual / same as always  No change in mood   no medicine changes  Some walking-no change in exercise   She is due for a colonoscopy -never got the reminder  brb from hemorroids occ   Gets shaky at times  Has to sit down  About once per day       Wt Readings from Last 3 Encounters:  03/18/18 218 lb 12 oz (99.2 kg)  07/29/17 219 lb 8 oz (99.6 kg)  07/23/16 213 lb 8 oz (96.8 kg)   35.04 kg/m   EKG today NSR with rate of 69 , occ ectopic beat Low voltage in precordial leads/ some flattened T waves (no change from previous)      Last labs Lab on 07/22/2017  Component Date Value Ref Range Status  . Sodium 07/22/2017 142  135 - 145 mEq/L Final  . Potassium 07/22/2017 3.6  3.5 - 5.1 mEq/L Final  . Chloride 07/22/2017 105  96 - 112 mEq/L Final  . CO2 07/22/2017 28  19 - 32 mEq/L Final  . Glucose, Bld 07/22/2017 103* 70 - 99 mg/dL Final  . BUN 07/22/2017 15  6 - 23 mg/dL Final  . Creatinine, Ser 07/22/2017 0.76  0.40 - 1.20 mg/dL Final  . Total Bilirubin 07/22/2017 0.6  0.2 - 1.2 mg/dL Final  . Alkaline Phosphatase 07/22/2017 69  39 - 117 U/L Final  . AST 07/22/2017 19  0 - 37 U/L Final  . ALT 07/22/2017 19  0 - 35 U/L Final  . Total Protein 07/22/2017 6.5  6.0 - 8.3 g/dL Final  . Albumin 07/22/2017 4.5  3.5 - 5.2 g/dL Final  . Calcium 07/22/2017 9.2  8.4 - 10.5 mg/dL Final  . GFR 07/22/2017 82.40  >60.00 mL/min Final  . WBC 07/22/2017 4.2  4.0 - 10.5 K/uL  Final  . RBC 07/22/2017 4.34  3.87 - 5.11 Mil/uL Final  . Hemoglobin 07/22/2017 12.8  12.0 - 15.0 g/dL Final  . HCT 07/22/2017 38.4  36.0 - 46.0 % Final  . MCV 07/22/2017 88.6  78.0 - 100.0 fl Final  . MCHC 07/22/2017 33.2  30.0 - 36.0 g/dL Final  . RDW 07/22/2017 13.8  11.5 - 15.5 % Final  . Platelets 07/22/2017 241.0  150.0 - 400.0 K/uL Final  . Neutrophils Relative % 07/22/2017 64.9  43.0 - 77.0 % Final  . Lymphocytes Relative 07/22/2017 25.0  12.0 - 46.0 % Final  . Monocytes Relative 07/22/2017 7.5  3.0 - 12.0 % Final  . Eosinophils Relative 07/22/2017 1.6  0.0 - 5.0 % Final  . Basophils Relative 07/22/2017 1.0  0.0 - 3.0 % Final  . Neutro Abs 07/22/2017 2.8  1.4 - 7.7 K/uL Final  . Lymphs Abs 07/22/2017 1.1  0.7 - 4.0 K/uL Final  . Monocytes Absolute 07/22/2017 0.3  0.1 - 1.0 K/uL Final  . Eosinophils Absolute 07/22/2017 0.1  0.0 - 0.7 K/uL Final  . Basophils Absolute 07/22/2017 0.0  0.0 - 0.1 K/uL Final  . Hgb A1c MFr Bld 07/22/2017 5.1  4.6 - 6.5 % Final   Glycemic Control Guidelines for People with Diabetes:Non Diabetic:  <6%Goal of Therapy:  <7%Additional Action Suggested:  >8%   . Cholesterol 07/22/2017 130  0 - 200 mg/dL Final   ATP III Classification       Desirable:  < 200 mg/dL               Borderline High:  200 - 239 mg/dL          High:  > = 240 mg/dL  . Triglycerides 07/22/2017 126.0  0.0 - 149.0 mg/dL Final   Normal:  <150 mg/dLBorderline High:  150 - 199 mg/dL  . HDL 07/22/2017 46.20  >39.00 mg/dL Final  . VLDL 07/22/2017 25.2  0.0 - 40.0 mg/dL Final  . LDL Cholesterol 07/22/2017 58  0 - 99 mg/dL Final  . Total CHOL/HDL Ratio 07/22/2017 3   Final                  Men          Women1/2 Average Risk     3.4          3.3Average Risk          5.0          4.42X Average Risk          9.6          7.13X Average Risk          15.0          11.0                      . NonHDL 07/22/2017 83.69   Final   NOTE:  Non-HDL goal should be 30 mg/dL higher than patient's LDL goal (i.e. LDL goal of < 70 mg/dL, would have non-HDL goal of < 100 mg/dL)  . TSH 07/22/2017 3.82  0.35 - 4.50 uIU/mL Final  . Vitamin B-12 07/22/2017 1,473* 211 - 911 pg/mL Final  . VITD 07/22/2017 34.23  30.00 - 100.00 ng/mL Final     Patient Active Problem List   Diagnosis Date Noted  . Colon cancer screening 03/18/2018  . Fatigue 03/18/2018  . Palpitations 06/20/2015  . Globus sensation 06/20/2015  . Thoracic back pain  06/20/2015  . Hyperglycemia 03/16/2015  . Somnolence 01/16/2015  . Stress reaction 01/16/2015  . Neck pain 07/04/2014  . Encounter for routine gynecological examination 09/01/2013  . Depression with anxiety 01/18/2013  . Post-menopausal 08/14/2012  . Other screening mammogram 04/29/2011  . Routine general medical examination at a health care facility 04/22/2011  . ARTHRALGIA 02/28/2010  . Vitamin D deficiency 03/08/2009  . IRRITABLE BOWEL SYNDROME 06/27/2008  . B12 deficiency 02/23/2008  . POLYP, COLON 03/06/2007  . Hyperlipidemia 03/06/2007  . ALLERGIC RHINITIS 03/06/2007  . GERD 03/06/2007  . HIATAL HERNIA 03/06/2007  . LOW  BACK PAIN 03/06/2007  . OSA (obstructive sleep apnea) 03/06/2007  . URINARY INCONTINENCE 03/06/2007  . MIGRAINES, HX OF 03/06/2007   Past Medical History:  Diagnosis Date  . Allergic rhinitis   . B12 deficiency   . Degenerative disc disease    back  . Fungal infection 2005   lamasil   . GERD (gastroesophageal reflux disease)    EGD 10/2004  . Hemorrhoids, internal    colonoscopy 02/2008  . Hyperlipemia   . Iron deficiency anemia   . Low back pain   . Obesity   . Urinary incontinence   . Vitamin D deficiency    mild   Past Surgical History:  Procedure Laterality Date  . CESAREAN SECTION  1985  . DILATION AND CURETTAGE OF UTERUS  2005  . HAND SURGERY  2000   nodule removed  . KNEE SURGERY  1970's   right  . NASAL SINUS SURGERY  2000's   right maxillary  . TONSILLECTOMY AND ADENOIDECTOMY     Social History   Tobacco Use  . Smoking status: Never Smoker  . Smokeless tobacco: Never Used  Substance Use Topics  . Alcohol use: Yes    Alcohol/week: 0.0 oz    Comment: occasional  . Drug use: No   Family History  Problem Relation Age of Onset  . Coronary artery disease Father        in his 37's  . Arthritis Father   . Stroke Father   . Heart disease Father        pacemaker  . Prostate cancer Father   . Breast cancer Maternal Grandmother    Allergies  Allergen Reactions  . Atorvastatin     REACTION: myalgias  . Oxybutynin     REACTION: rash   Current Outpatient Medications on File Prior to Visit  Medication Sig Dispense Refill  . Calcium Carbonate-Vitamin D (CALTRATE 600+D) 600-400 MG-UNIT per tablet Take 1 tablet by mouth daily.      . Cholecalciferol (VITAMIN D-3 PO) Take 1 tablet by mouth daily.    . Cyanocobalamin (B-12 PO) Take by mouth daily.    . cyclobenzaprine (FLEXERIL) 10 MG tablet Take 0.5 tablets (5 mg total) by mouth 3 (three) times daily as needed. For muscle spasm and pain 30 tablet 5  . FLUoxetine (PROZAC) 20 MG tablet Take 2 tablets (40 mg  total) by mouth daily. 180 tablet 3  . hydrochlorothiazide (HYDRODIURIL) 25 MG tablet TAKE ONE-HALF TO ONE TABLET BY MOUTH ONCE DAILY AS NEEDED 90 tablet 3  . omeprazole (PRILOSEC) 20 MG capsule Take 1 capsule (20 mg total) by mouth daily. 90 capsule 3  . oxybutynin (DITROPAN-XL) 10 MG 24 hr tablet Take 1 tablet (10 mg total) by mouth at bedtime. 90 tablet 3  . QUDEXY XR 150 MG CS24 Take 1 tablet by mouth daily.    . rosuvastatin (CRESTOR) 10 MG tablet  Take 1 tablet (10 mg total) by mouth daily. 90 tablet 3  . SUMAtriptan (IMITREX) 20 MG/ACT nasal spray Place 20 mg into the nose every 2 (two) hours as needed for migraine. May repeat in 2 hours if headache persists or recurs.     No current facility-administered medications on file prior to visit.     Review of Systems  Constitutional: Positive for appetite change and fatigue. Negative for activity change, fever and unexpected weight change.  HENT: Positive for congestion, postnasal drip and sinus pressure. Negative for ear pain, rhinorrhea, sinus pain and sore throat.   Eyes: Negative for pain, redness and visual disturbance.  Respiratory: Negative for cough, shortness of breath, wheezing and stridor.   Cardiovascular: Positive for palpitations. Negative for chest pain and leg swelling.  Gastrointestinal: Negative for abdominal pain, blood in stool, constipation and diarrhea.       Heartburn occ   Endocrine: Negative for polydipsia and polyuria.  Genitourinary: Negative for dysuria, frequency, urgency, vaginal bleeding and vaginal discharge.  Musculoskeletal: Negative for arthralgias, back pain and myalgias.  Skin: Negative for pallor and rash.  Allergic/Immunologic: Negative for environmental allergies.  Neurological: Positive for light-headedness. Negative for dizziness, syncope and headaches.  Hematological: Negative for adenopathy. Does not bruise/bleed easily.  Psychiatric/Behavioral: Negative for decreased concentration and dysphoric  mood. The patient is not nervous/anxious.        Objective:   Physical Exam  Constitutional: She appears well-developed and well-nourished. No distress.  obese and well appearing   HENT:  Head: Normocephalic and atraumatic.  Right Ear: External ear normal.  Left Ear: External ear normal.  Nose: Nose normal.  Mouth/Throat: Oropharynx is clear and moist.  Boggy nares Dev septum  Eyes: Pupils are equal, round, and reactive to light. Conjunctivae and EOM are normal. Right eye exhibits no discharge. Left eye exhibits no discharge. No scleral icterus.  Neck: Normal range of motion. Neck supple. No JVD present. Carotid bruit is not present. No thyromegaly present.  Cardiovascular: Normal rate, regular rhythm, normal heart sounds and intact distal pulses. Exam reveals no gallop.  Pulmonary/Chest: Effort normal and breath sounds normal. No respiratory distress. She has no wheezes. She has no rales.  Abdominal: Soft. Bowel sounds are normal. She exhibits no distension and no mass. There is no tenderness.  Musculoskeletal: She exhibits no edema or tenderness.  Lymphadenopathy:    She has no cervical adenopathy.  Neurological: She is alert. She has normal reflexes. No cranial nerve deficit. She exhibits normal muscle tone. Coordination normal.  Skin: Skin is warm and dry. No rash noted. No erythema. No pallor.  Solar lentigines diffusely   Psychiatric: She has a normal mood and affect.  Seems fatigued Mood is ok           Assessment & Plan:   Problem List Items Addressed This Visit      Respiratory   OSA (obstructive sleep apnea)    Trying nasal pillows with next mask  Also considering nasal surgery- for dev septum         Other   B12 deficiency    Inc in fatigue Level today      Relevant Orders   Vitamin B12   Colon cancer screening    Due for colonoscopy (states she did not get a reminder) Ref for this       Relevant Orders   Ambulatory referral to Gastroenterology     Fatigue    Worse/with malaise  Also some palpitations /sinus issues  and shaky episodes Labs today  Stop herbal supplement drink  Plan from there       Relevant Orders   CBC with Differential/Platelet   Comprehensive metabolic panel   TSH   Hyperglycemia    A1C today  Pt is fatigued  Diet is fair  No reg exercise       Relevant Orders   Hemoglobin A1c   Palpitations - Primary    Per pt diff than in the past Rev EKG-ectopic beat Will stop caffeine and herbal supplement drink  Ref to cardiology  Also sites some atypical chest discomfort  Reassuring exam       Relevant Orders   Ambulatory referral to Cardiology   TSH    Other Visit Diagnoses    Palpitation       Relevant Orders   EKG 12-Lead (Completed)   CBC with Differential/Platelet   TSH

## 2018-03-18 NOTE — Assessment & Plan Note (Signed)
Per pt diff than in the past Rev EKG-ectopic beat Will stop caffeine and herbal supplement drink  Ref to cardiology  Also sites some atypical chest discomfort  Reassuring exam

## 2018-03-27 NOTE — Progress Notes (Addendum)
Cardiology Office Note  Date:  03/30/2018   ID:  Pamela, Anthony April 04, 1956, MRN [id number]  PCP:  Abner Greenspan, MD   Chief Complaint  Patient presents with  . Other    Former patient of Dr Acie Fredrickson. Patient c/o Palpitations and a flutter in chest. Meds reviewed verbally with patient.     HPI:  Pamela Anthony is a 62 y.o. female with PMH of  OSA on CPAP Hyperlipidemia palpitations  chest pain  + family hx of CAD in her father's side. Obesity Who presents for follow-up of her palpitations and chest fluttering  Last seen in cardiology clinic 07/2015 At that time was having chronic chest pain and palpitations felt to be atypical in nature  Recently seen BY PMD Reported that she is tired, falls asleep quickly GERD symptoms  "flutter from left chest to throat"  At rest will appreciate a chest pressure, Denies having any symptoms with exertion No regular exercise program  Most bothered by her palpitations ,  Now also with fluttering in her chest for the past few months  EKG personally reviewed by myself on todays visit Shows normal sinus rhythm with rate 83 bpm PVCs, nonspecific T wave abnormality    PMH:   has a past medical history of Allergic rhinitis, B12 deficiency, Degenerative disc disease, Fungal infection (2005), GERD (gastroesophageal reflux disease), Hemorrhoids, internal, Hyperlipemia, Iron deficiency anemia, Low back pain, Obesity, Urinary incontinence, and Vitamin D deficiency.  PSH:    Past Surgical History:  Procedure Laterality Date  . CESAREAN SECTION  1985  . DILATION AND CURETTAGE OF UTERUS  2005  . HAND SURGERY  2000   nodule removed  . KNEE SURGERY  1970's   right  . NASAL SINUS SURGERY  2000's   right maxillary  . TONSILLECTOMY AND ADENOIDECTOMY      Current Outpatient Medications  Medication Sig Dispense Refill  . Calcium Carbonate-Vitamin D (CALTRATE 600+D) 600-400 MG-UNIT per tablet Take 1 tablet by mouth daily.      .  Cholecalciferol (VITAMIN D-3 PO) Take 1 tablet by mouth daily.    . Cyanocobalamin (B-12 PO) Take by mouth daily.    . cyclobenzaprine (FLEXERIL) 10 MG tablet Take 0.5 tablets (5 mg total) by mouth 3 (three) times daily as needed. For muscle spasm and pain 30 tablet 5  . FLUoxetine (PROZAC) 20 MG tablet Take 2 tablets (40 mg total) by mouth daily. 180 tablet 3  . hydrochlorothiazide (HYDRODIURIL) 25 MG tablet TAKE ONE-HALF TO ONE TABLET BY MOUTH ONCE DAILY AS NEEDED 90 tablet 3  . omeprazole (PRILOSEC) 20 MG capsule Take 1 capsule (20 mg total) by mouth daily. 90 capsule 3  . oxybutynin (DITROPAN-XL) 10 MG 24 hr tablet Take 1 tablet (10 mg total) by mouth at bedtime. 90 tablet 3  . QUDEXY XR 150 MG CS24 Take 1 tablet by mouth daily.    . rosuvastatin (CRESTOR) 10 MG tablet Take 1 tablet (10 mg total) by mouth daily. 90 tablet 3  . SUMAtriptan (IMITREX) 20 MG/ACT nasal spray Place 20 mg into the nose every 2 (two) hours as needed for migraine. May repeat in 2 hours if headache persists or recurs.     No current facility-administered medications for this visit.      Allergies:   Atorvastatin and Oxybutynin   Social History:  The patient  reports that she has never smoked. She has never used smokeless tobacco. She reports that she drinks alcohol. She reports that she does  not use drugs.   Family History:   family history includes Arthritis in her father; Breast cancer in her maternal grandmother; Coronary artery disease in her father; Heart disease in her father; Prostate cancer in her father; Stroke in her father.    Review of Systems: Review of Systems  Constitutional: Negative.   Respiratory: Negative.   Cardiovascular: Positive for palpitations.       Chest fluttering, occasional chest tightness  Gastrointestinal: Negative.   Musculoskeletal: Negative.   Neurological: Negative.   Psychiatric/Behavioral: The patient has insomnia.   All other systems reviewed and are  negative.    PHYSICAL EXAM: VS:  BP 110/80 (BP Location: Left Arm, Patient Position: Sitting, Cuff Size: Normal)   Pulse 83   Ht 5\' 8"  (1.727 m)   Wt 219 lb (99.3 kg)   LMP 04/17/2011   BMI 33.30 kg/m  , BMI Body mass index is 33.3 kg/m. GEN: Well nourished, well developed, in no acute distress  HEENT: normal  Neck: no JVD, carotid bruits, or masses Cardiac: RRR; no murmurs, rubs, or gallops,no edema  Respiratory:  clear to auscultation bilaterally, normal work of breathing GI: soft, nontender, nondistended, + BS MS: no deformity or atrophy  Skin: warm and dry, no rash Neuro:  Strength and sensation are intact Psych: euthymic mood, full affect    Recent Labs: 03/18/2018: ALT 20; BUN 17; Creatinine, Ser 0.70; Hemoglobin 12.7; Platelets 234.0; Potassium 3.5; Sodium 140; TSH 1.70    Lipid Panel Lab Results  Component Value Date   CHOL 130 07/22/2017   HDL 46.20 07/22/2017   LDLCALC 58 07/22/2017   TRIG 126.0 07/22/2017      Wt Readings from Last 3 Encounters:  03/30/18 219 lb (99.3 kg)  03/18/18 218 lb 12 oz (99.2 kg)  07/29/17 219 lb 8 oz (99.6 kg)       ASSESSMENT AND PLAN:  Palpitations - Plan: EKG 12-Lead Describes it as a fluttering in her chest Unable to exclude arrhythmia as a cause of her symptoms including atrial flutter, atrial fibrillation She is not interested in medications at this time, prefers event monitor Monitor has been ordered, 2 weeks duration  Chronic fatigue Recommended regular exercise program for conditioning and weight loss Sleep hygiene  Depression with anxiety Possibly contributing to symptoms above Recommended walking program  Chest pain with moderate risk for cardiac etiology Chest pain is atypical in nature, often presenting at rest, not with exertion Stress test was offered if symptoms get worse Discussed other types of risk factor screening, After further discussion we will order CT coronary calcium scoring  OSA  (obstructive sleep apnea) Recommended compliance with her CPAP, weight loss   Disposition:   F/U as needed   Total encounter time more than 45 minutes  Greater than 50% was spent in counseling and coordination of care with the patient    Orders Placed This Encounter  Procedures  . EKG 12-Lead     Signed, Esmond Plants, M.D., Ph.D. 03/30/2018  [place], [place]

## 2018-03-30 ENCOUNTER — Ambulatory Visit: Payer: 59

## 2018-03-30 ENCOUNTER — Encounter: Payer: Self-pay | Admitting: Cardiovascular Disease

## 2018-03-30 ENCOUNTER — Ambulatory Visit: Payer: 59 | Admitting: Cardiovascular Disease

## 2018-03-30 VITALS — BP 110/80 | HR 83 | Ht 68.0 in | Wt 219.0 lb

## 2018-03-30 DIAGNOSIS — F418 Other specified anxiety disorders: Secondary | ICD-10-CM | POA: Diagnosis not present

## 2018-03-30 DIAGNOSIS — R5382 Chronic fatigue, unspecified: Secondary | ICD-10-CM

## 2018-03-30 DIAGNOSIS — R079 Chest pain, unspecified: Secondary | ICD-10-CM

## 2018-03-30 DIAGNOSIS — R002 Palpitations: Secondary | ICD-10-CM

## 2018-03-30 DIAGNOSIS — G4733 Obstructive sleep apnea (adult) (pediatric): Secondary | ICD-10-CM

## 2018-03-30 NOTE — Patient Instructions (Addendum)
Medication Instructions:   No medication changes made  Labwork:  No new labs needed  Testing/Procedures:  We will order an event monitor for fluttering and palpitations  We will order a CT coronary calcium score for family hx   $150.00 [phone] [address] [place]   Follow-Up: It was a pleasure seeing you in the office today. Please call us if you have new issues that need to be addressed before your next appt.  [phone]  Your physician wants you to follow-up in: as needed   If you need a refill on your cardiac medications before your next appointment, please call your pharmacy.  For educational health videos Log in to : [email or web address] Or : [email or web address], password : [email or web address]

## 2018-04-09 ENCOUNTER — Other Ambulatory Visit: Payer: Self-pay

## 2018-04-09 ENCOUNTER — Ambulatory Visit (AMBULATORY_SURGERY_CENTER): Payer: Self-pay | Admitting: *Deleted

## 2018-04-09 VITALS — Ht 67.5 in | Wt 222.0 lb

## 2018-04-09 DIAGNOSIS — Z1211 Encounter for screening for malignant neoplasm of colon: Secondary | ICD-10-CM

## 2018-04-09 MED ORDER — PEG-KCL-NACL-NASULF-NA ASC-C 140 G PO SOLR: 1.0000 | ORAL | 0 refills | Status: DC

## 2018-04-09 NOTE — Progress Notes (Signed)
No egg or soy allergy known to patient  No issues with past sedation with any surgeries  or procedures, no intubation problems  No diet pills per patient No home 02 use per patient  No blood thinners per patient  Pt denies issues with constipation  No A fib or A flutter  EMMI video sent to pt's e mail - pt declined Pt is wearing a holter monitor- she has a CT score scheduled for 5-9- pt has no cardio OV scheduled as of yet- PCP thinks flutters she feels is stress- pt made aware to call after her monitor is removed 5-6 with update- I have in black book to check the 5-9 Cardiac score- if positive and pt needs futher testing she is aware we will have to cancel her 5-13 colon and Rs once she is cleared by cardiology- FAM hx cardiac dx-  plenvu univ coupon given today in PV

## 2018-04-10 ENCOUNTER — Encounter: Payer: Self-pay | Admitting: Gastroenterology

## 2018-04-16 ENCOUNTER — Ambulatory Visit (INDEPENDENT_AMBULATORY_CARE_PROVIDER_SITE_OTHER)
Admission: RE | Admit: 2018-04-16 | Discharge: 2018-04-16 | Disposition: A | Discharge status: Home / Self Care | Payer: 59 | Source: Ambulatory Visit | Attending: Cardiovascular Disease | Admitting: Cardiovascular Disease

## 2018-04-16 DIAGNOSIS — R002 Palpitations: Secondary | ICD-10-CM

## 2018-04-16 DIAGNOSIS — R079 Chest pain, unspecified: Secondary | ICD-10-CM

## 2018-04-17 ENCOUNTER — Telehealth: Payer: Self-pay | Admitting: *Deleted

## 2018-04-17 NOTE — Telephone Encounter (Signed)
Benjamine Mola,  I have reviewed this pt's chart and she is cleared for anesthetic care at [facility].  Thanks much,  Osvaldo Angst

## 2018-04-17 NOTE — Telephone Encounter (Signed)
[name],    Can you review this chart for me. This pt has a colon Monday 5-13 with Danis.   She was wearing a Holter monitor at her PV and she had a cardiac calcium test yesterday. This showed a score of 0.  She has not heard from the monitor that she turned in Tuesday.  She has a family hx of cardiac issues and per 4-22 cardio note she had some atypical chest pain.  Pt said she is fine for her colon Monday.  I just want to be sure she's ok per your perspective that way I can cancel her today if needed.  Thanks, Pamela Anthony

## 2018-04-17 NOTE — Telephone Encounter (Signed)
Thanks [name].  Will proceed as scheduled Lelan Pons PV

## 2018-04-20 ENCOUNTER — Other Ambulatory Visit: Payer: Self-pay

## 2018-04-20 ENCOUNTER — Encounter: Payer: Self-pay | Admitting: Gastroenterology

## 2018-04-20 ENCOUNTER — Ambulatory Visit (AMBULATORY_SURGERY_CENTER): Payer: 59 | Admitting: Gastroenterology

## 2018-04-20 VITALS — BP 105/69 | HR 64 | Temp 97.8°F | Resp 12 | Ht 67.0 in | Wt 222.0 lb

## 2018-04-20 DIAGNOSIS — Z1211 Encounter for screening for malignant neoplasm of colon: Secondary | ICD-10-CM | POA: Diagnosis present

## 2018-04-20 MED ORDER — SODIUM CHLORIDE 0.9 % IV SOLN: 500.0000 mL | Freq: Once | INTRAVENOUS | Status: DC

## 2018-04-20 NOTE — Progress Notes (Signed)
Report given to PACU, vss 

## 2018-04-20 NOTE — Op Note (Signed)
[place] Patient Name: Pamela Anthony Procedure Date: 04/20/2018 1:22 PM MRN: [id number] Endoscopist: Mallie Mussel L. Loletha Carrow , MD Age: 62 Referring MD:  Date of Birth: 1956/04/03 Gender: Female Account #: [id number] Procedure:                Colonoscopy Indications:              Screening for colorectal malignant neoplasm (no                            polyps 02/2008) Medicines:                Monitored Anesthesia Care Procedure:                Pre-Anesthesia Assessment:                           - Prior to the procedure, a History and Physical                            was performed, and patient medications and                            allergies were reviewed. The patient's tolerance of                            previous anesthesia was also reviewed. The risks                            and benefits of the procedure and the sedation                            options and risks were discussed with the patient.                            All questions were answered, and informed consent                            was obtained. Prior Anticoagulants: The patient has                            taken no previous anticoagulant or antiplatelet                            agents. ASA Grade Assessment: II - A patient with                            mild systemic disease. After reviewing the risks                            and benefits, the patient was deemed in                            satisfactory condition to undergo the procedure.  After obtaining informed consent, the colonoscope                            was passed under direct vision. Throughout the                            procedure, the patient's blood pressure, pulse, and                            oxygen saturations were monitored continuously. The                            Colonoscope was introduced through the anus and                            advanced to the the cecum, identified by                       appendiceal orifice and ileocecal valve. The                            colonoscopy was performed without difficulty. The                            patient tolerated the procedure well. The quality                            of the bowel preparation was excellent. The                            ileocecal valve, appendiceal orifice, and rectum                            were photographed. Scope In: 1:26:58 PM Scope Out: 1:42:09 PM Scope Withdrawal Time: 0 hours 10 minutes 37 seconds  Total Procedure Duration: 0 hours 15 minutes 11 seconds  Findings:                 The perianal and digital rectal examinations were                            normal.                           The entire examined colon appeared normal on direct                            and retroflexion views. Complications:            No immediate complications. Estimated Blood Loss:     Estimated blood loss: none. Impression:               - The entire examined colon is normal on direct and                            retroflexion views.                           -  No specimens collected. Recommendation:           - Patient has a contact number available for                            emergencies. The signs and symptoms of potential                            delayed complications were discussed with the                            patient. Return to normal activities tomorrow.                            Written discharge instructions were provided to the                            patient.                           - Resume previous diet.                           - Continue present medications.                           - Repeat colonoscopy in 10 years for screening                            purposes.  L. Loletha Carrow, MD 04/20/2018 1:44:48 PM This report has been signed electronically.

## 2018-04-20 NOTE — Progress Notes (Signed)
No problems noted in the recovery room. maw 

## 2018-04-20 NOTE — Progress Notes (Signed)
Pt's states no medical or surgical changes since previsit or office visit. 

## 2018-04-20 NOTE — Patient Instructions (Signed)
YOU HAD AN ENDOSCOPIC PROCEDURE TODAY AT [facility]:   Refer to the procedure report that was given to you for any specific questions about what was found during the examination.  If the procedure report does not answer your questions, please call your gastroenterologist to clarify.  If you requested that your care partner not be given the details of your procedure findings, then the procedure report has been included in a sealed envelope for you to review at your convenience later.  YOU SHOULD EXPECT: Some feelings of bloating in the abdomen. Passage of more gas than usual.  Walking can help get rid of the air that was put into your GI tract during the procedure and reduce the bloating. If you had a lower endoscopy (such as a colonoscopy or flexible sigmoidoscopy) you may notice spotting of blood in your stool or on the toilet paper. If you underwent a bowel prep for your procedure, you may not have a normal bowel movement for a few days.  Please Note:  You might notice some irritation and congestion in your nose or some drainage.  This is from the oxygen used during your procedure.  There is no need for concern and it should clear up in a day or so.  SYMPTOMS TO REPORT IMMEDIATELY:   Following lower endoscopy (colonoscopy or flexible sigmoidoscopy):  Excessive amounts of blood in the stool  Significant tenderness or worsening of abdominal pains  Swelling of the abdomen that is new, acute  Fever of 100F or higher    For urgent or emergent issues, a gastroenterologist can be reached at any hour by calling [phone].   DIET:  We do recommend a small meal at first, but then you may proceed to your regular diet.  Drink plenty of fluids but you should avoid alcoholic beverages for 24 hours.  ACTIVITY:  You should plan to take it easy for the rest of today and you should NOT DRIVE or use heavy machinery until tomorrow (because of the sedation medicines used during the test).     FOLLOW UP: Our staff will call the number listed on your records the next business day following your procedure to check on you and address any questions or concerns that you may have regarding the information given to you following your procedure. If we do not reach you, we will leave a message.  However, if you are feeling well and you are not experiencing any problems, there is no need to return our call.  We will assume that you have returned to your regular daily activities without incident.  If any biopsies were taken you will be contacted by phone or by letter within the next 1-3 weeks.  Please call us at [phone] if you have not heard about the biopsies in 3 weeks.    SIGNATURES/CONFIDENTIALITY: You and/or your care partner have signed paperwork which will be entered into your electronic medical record.  These signatures attest to the fact that that the information above on your After Visit Summary has been reviewed and is understood.  Full responsibility of the confidentiality of this discharge information lies with you and/or your care-partner.     You may resume your current medications today. Repeat colonoscopy for screening purposes in 10 years.  Please call if any questions or concerns.

## 2018-04-21 ENCOUNTER — Telehealth: Payer: Self-pay | Admitting: *Deleted

## 2018-04-21 NOTE — Telephone Encounter (Signed)
Patient contacted. No questions or concerns

## 2018-05-01 ENCOUNTER — Telehealth: Payer: Self-pay | Admitting: Cardiovascular Disease

## 2018-05-01 NOTE — Telephone Encounter (Signed)
Patient calling for zio results.

## 2018-05-01 NOTE — Telephone Encounter (Signed)
Patient notified that Dr Rockey Situ has not finalized results yet but that we will call her once he has. She was understanding and appreciative. Routing to Dr Rockey Situ to make him aware.

## 2018-05-11 DIAGNOSIS — J34829 Nasal valve collapse, unspecified: Secondary | ICD-10-CM | POA: Insufficient documentation

## 2018-05-11 DIAGNOSIS — J342 Deviated nasal septum: Secondary | ICD-10-CM | POA: Insufficient documentation

## 2018-05-11 DIAGNOSIS — J3489 Other specified disorders of nose and nasal sinuses: Secondary | ICD-10-CM | POA: Insufficient documentation

## 2018-07-01 ENCOUNTER — Telehealth: Payer: Self-pay | Admitting: Family Medicine

## 2018-07-01 MED ORDER — FLUOXETINE HCL 20 MG PO CAPS: 40.0000 mg | ORAL_CAPSULE | Freq: Every day | ORAL | 3 refills | Status: DC

## 2018-07-01 NOTE — Telephone Encounter (Signed)
I sent it  

## 2018-07-01 NOTE — Telephone Encounter (Signed)
Copied from [place] [id number]. Topic: Quick Communication - See Telephone Encounter >> Jul 01, 2018  8:49 AM Bea Graff, NT wrote: CRM for notification. See Telephone encounter for: 07/01/18. Pt calling and states that she needs the FLUoxetine (PROZAC) 20 MG changed from the tablets to the capsules for her insurance to cover. Requesting to speak with the nurse regarding. [facility] [address] [place] [phone] (Phone) [phone] (Fax)

## 2018-07-23 ENCOUNTER — Other Ambulatory Visit: Payer: Self-pay | Admitting: Family Medicine

## 2018-07-29 ENCOUNTER — Telehealth: Payer: Self-pay | Admitting: Family Medicine

## 2018-07-29 ENCOUNTER — Other Ambulatory Visit (INDEPENDENT_AMBULATORY_CARE_PROVIDER_SITE_OTHER): Payer: No Typology Code available for payment source

## 2018-07-29 DIAGNOSIS — E559 Vitamin D deficiency, unspecified: Secondary | ICD-10-CM

## 2018-07-29 DIAGNOSIS — R739 Hyperglycemia, unspecified: Secondary | ICD-10-CM | POA: Diagnosis not present

## 2018-07-29 DIAGNOSIS — E7800 Pure hypercholesterolemia, unspecified: Secondary | ICD-10-CM

## 2018-07-29 DIAGNOSIS — Z0000 Encounter for general adult medical examination without abnormal findings: Secondary | ICD-10-CM

## 2018-07-29 DIAGNOSIS — E538 Deficiency of other specified B group vitamins: Secondary | ICD-10-CM | POA: Diagnosis not present

## 2018-07-29 LAB — LIPID PANEL
CHOLESTEROL: 135 mg/dL (ref 0–200)
HDL: 41.3 mg/dL (ref >39.00)
LDL CALC: 63 mg/dL (ref 0–99)
NonHDL: 94.14
TRIGLYCERIDES: 154 mg/dL — AB (ref 0.0–149.0)
Total CHOL/HDL Ratio: 3
VLDL: 30.8 mg/dL (ref 0.0–40.0)

## 2018-07-29 LAB — COMPREHENSIVE METABOLIC PANEL
ALBUMIN: 4.4 g/dL (ref 3.5–5.2)
ALK PHOS: 72 U/L (ref 39–117)
ALT: 19 U/L (ref 0–35)
AST: 17 U/L (ref 0–37)
BILIRUBIN TOTAL: 0.5 mg/dL (ref 0.2–1.2)
BUN: 14 mg/dL (ref 6–23)
CO2: 28 mEq/L (ref 19–32)
CREATININE: 0.8 mg/dL (ref 0.40–1.20)
Calcium: 9.4 mg/dL (ref 8.4–10.5)
Chloride: 105 mEq/L (ref 96–112)
GFR: 77.4 mL/min (ref >60.00)
Glucose, Bld: 103 mg/dL — ABNORMAL HIGH (ref 70–99)
Potassium: 3.4 mEq/L — ABNORMAL LOW (ref 3.5–5.1)
Sodium: 143 mEq/L (ref 135–145)
TOTAL PROTEIN: 7.1 g/dL (ref 6.0–8.3)

## 2018-07-29 LAB — CBC WITH DIFFERENTIAL/PLATELET
Basophils Absolute: 0 10*3/uL (ref 0.0–0.1)
Basophils Relative: 0.7 % (ref 0.0–3.0)
EOS ABS: 0.1 10*3/uL (ref 0.0–0.7)
Eosinophils Relative: 1.8 % (ref 0.0–5.0)
HCT: 38.8 % (ref 36.0–46.0)
HEMOGLOBIN: 13.2 g/dL (ref 12.0–15.0)
Lymphocytes Relative: 22.9 % (ref 12.0–46.0)
Lymphs Abs: 1.2 10*3/uL (ref 0.7–4.0)
MCHC: 33.9 g/dL (ref 30.0–36.0)
MCV: 86.6 fl (ref 78.0–100.0)
MONO ABS: 0.4 10*3/uL (ref 0.1–1.0)
Monocytes Relative: 7.9 % (ref 3.0–12.0)
NEUTROS PCT: 66.7 % (ref 43.0–77.0)
Neutro Abs: 3.6 10*3/uL (ref 1.4–7.7)
Platelets: 253 10*3/uL (ref 150.0–400.0)
RBC: 4.49 Mil/uL (ref 3.87–5.11)
RDW: 13.8 % (ref 11.5–15.5)
WBC: 5.4 10*3/uL (ref 4.0–10.5)

## 2018-07-29 LAB — VITAMIN B12: Vitamin B-12: 720 pg/mL (ref 211–911)

## 2018-07-29 LAB — HEMOGLOBIN A1C: Hgb A1c MFr Bld: 5.2 % (ref 4.6–6.5)

## 2018-07-29 LAB — VITAMIN D 25 HYDROXY (VIT D DEFICIENCY, FRACTURES): VITD: 38.22 ng/mL (ref 30.00–100.00)

## 2018-07-29 LAB — TSH: TSH: 2.78 u[IU]/mL (ref 0.35–4.50)

## 2018-07-29 NOTE — Telephone Encounter (Signed)
-----   Message from Lendon Collar, RT sent at 07/21/2018 11:48 AM EDT ----- Regarding: Lab orders for Wed 07/29/18 Please enter CPE lab orders for 07/29/18. Thanks-Lauren

## 2018-08-03 ENCOUNTER — Ambulatory Visit (INDEPENDENT_AMBULATORY_CARE_PROVIDER_SITE_OTHER): Payer: No Typology Code available for payment source | Admitting: Family Medicine

## 2018-08-03 ENCOUNTER — Encounter: Payer: Self-pay | Admitting: Family Medicine

## 2018-08-03 VITALS — BP 128/88 | HR 78 | Temp 98.4°F | Ht 67.0 in | Wt 221.8 lb

## 2018-08-03 DIAGNOSIS — Z0000 Encounter for general adult medical examination without abnormal findings: Secondary | ICD-10-CM

## 2018-08-03 DIAGNOSIS — E559 Vitamin D deficiency, unspecified: Secondary | ICD-10-CM | POA: Diagnosis not present

## 2018-08-03 DIAGNOSIS — R739 Hyperglycemia, unspecified: Secondary | ICD-10-CM

## 2018-08-03 DIAGNOSIS — E7800 Pure hypercholesterolemia, unspecified: Secondary | ICD-10-CM

## 2018-08-03 DIAGNOSIS — E538 Deficiency of other specified B group vitamins: Secondary | ICD-10-CM

## 2018-08-03 MED ORDER — FLUOXETINE HCL 20 MG PO CAPS: 40.0000 mg | ORAL_CAPSULE | Freq: Every day | ORAL | 3 refills | Status: DC

## 2018-08-03 MED ORDER — HYDROCHLOROTHIAZIDE 25 MG PO TABS: ORAL_TABLET | ORAL | 3 refills | Status: DC

## 2018-08-03 MED ORDER — ROSUVASTATIN CALCIUM 10 MG PO TABS: 10.0000 mg | ORAL_TABLET | Freq: Every day | ORAL | 3 refills | Status: DC

## 2018-08-03 MED ORDER — OMEPRAZOLE 20 MG PO CPDR: 20.0000 mg | DELAYED_RELEASE_CAPSULE | Freq: Every day | ORAL | 3 refills | Status: DC

## 2018-08-03 MED ORDER — OXYBUTYNIN CHLORIDE ER 10 MG PO TB24: 10.0000 mg | ORAL_TABLET | Freq: Every day | ORAL | 3 refills | Status: DC

## 2018-08-03 NOTE — Assessment & Plan Note (Signed)
Lab Results  Component Value Date   HGBA1C 5.2 07/29/2018   Stable disc imp of low glycemic diet and wt loss to prevent DM2

## 2018-08-03 NOTE — Assessment & Plan Note (Addendum)
D level of 38 Vitamin D level is therapeutic with current supplementation Disc importance of this to bone and overall health

## 2018-08-03 NOTE — Assessment & Plan Note (Signed)
Reviewed health habits including diet and exercise and skin cancer prevention Reviewed appropriate screening tests for age  Also reviewed health mt list, fam hx and immunization status , as well as social and family history   See HPI Labs reviewed Don't forget to get your flu shot in the fall   Also -schedule your mammogram next month   Take care of yourself  Keep working on healthy diet and exercise the best you can

## 2018-08-03 NOTE — Assessment & Plan Note (Signed)
Disc goals for lipids and reasons to control them Rev last labs with pt Rev low sat fat diet in detail Continue crestor and diet-well controlled

## 2018-08-03 NOTE — Patient Instructions (Addendum)
Don't forget to get your flu shot in the fall   Also -schedule your mammogram next month   Take care of yourself  Keep working on healthy diet and exercise the best you can   Fruit and deep leafy greens for potassium  Try to get most of your carbohydrates from produce (with the exception of white potatoes)  Eat less bread/pasta/rice/snack foods/cereals/sweets and other items from the middle of the grocery store (processed carbs)

## 2018-08-03 NOTE — Assessment & Plan Note (Signed)
Lab Results  Component Value Date   [id number] 07/29/2018    continue current supplementation

## 2018-08-03 NOTE — Progress Notes (Signed)
Subjective:    Patient ID: Pamela Anthony, female    DOB: [birth year], 62 y.o.   MRN: [id number]  HPI Here for health maintenance exam and to review chronic medical problems    Stressful summer  A lot of work  Also husband's health is not good / has heart problems CHF   Otherwise feels ok  Some aches and pains   Will have ENT surgery in oct with Dr Carlis Abbott -that should help a lot  A lot of nasal congestion   Wt Readings from Last 3 Encounters:  08/03/18 221 lb 12.8 oz (100.6 kg)  04/20/18 222 lb (100.7 kg)  04/09/18 222 lb (100.7 kg)  stable - not a lot of time for self care  Got a new fit bit / exercises when she can  34.74 kg/m   Pap 9/14 Would rather not do / she is not sexually active  No gyn symptoms    Flu shot -will get in the fall   Mammogram 9/18 neg  (will schedule that herself) Self breast exam -no lumps/changes   Tetanus shot 9/15  Colonoscopy 5/19 with 10 y recall   zostavax 10/15 Had shingrix 8/3 - will plan 2nd one   Vit D level is 38.2 dexa 9/13 No falls or fractures   Vit B12 def Lab Results  Component Value Date   VITAMINB12 720 07/29/2018   BP Readings from Last 3 Encounters:  08/03/18 128/88  04/20/18 105/69  03/30/18 110/80    Elevated glucose Lab Results  Component Value Date   HGBA1C 5.2 07/29/2018  last was 5.1  Stable -she is mindful of carbs and sweets    Hyperlipidemia Lab Results  Component Value Date   CHOL 135 07/29/2018   CHOL 130 07/22/2017   CHOL 123 07/16/2016   Lab Results  Component Value Date   HDL 41.30 07/29/2018   HDL 46.20 07/22/2017   HDL 42.70 07/16/2016   Lab Results  Component Value Date   LDLCALC 63 07/29/2018   LDLCALC 58 07/22/2017   LDLCALC 54 07/16/2016   Lab Results  Component Value Date   TRIG 154.0 (H) 07/29/2018   TRIG 126.0 07/22/2017   TRIG 129.0 07/16/2016   Lab Results  Component Value Date   CHOLHDL 3 07/29/2018   CHOLHDL 3 07/22/2017   CHOLHDL 3 07/16/2016   Lab  Results  Component Value Date   LDLDIRECT 182.2 08/25/2013   LDLDIRECT 183.6 08/07/2012   LDLDIRECT 170.8 04/23/2011  crestor and diet  Overall stable   K was 3.4 Takes hctz  Eats a banana per day   Lab Results  Component Value Date   CREATININE 0.80 07/29/2018   BUN 14 07/29/2018   NA 143 07/29/2018   K 3.4 (L) 07/29/2018   CL 105 07/29/2018   CO2 28 07/29/2018   Lab Results  Component Value Date   ALT 19 07/29/2018   AST 17 07/29/2018   ALKPHOS 72 07/29/2018   BILITOT 0.5 07/29/2018   Lab Results  Component Value Date   WBC 5.4 07/29/2018   HGB 13.2 07/29/2018   HCT 38.8 07/29/2018   MCV 86.6 07/29/2018   PLT 253.0 07/29/2018    Lab Results  Component Value Date   TSH 2.78 07/29/2018      Patient Active Problem List   Diagnosis Date Noted  . Colon cancer screening 03/18/2018  . Fatigue 03/18/2018  . Palpitations 06/20/2015  . Globus sensation 06/20/2015  . Thoracic back pain 06/20/2015  .  Hyperglycemia 03/16/2015  . Somnolence 01/16/2015  . Stress reaction 01/16/2015  . Neck pain 07/04/2014  . Encounter for routine gynecological examination 09/01/2013  . Depression with anxiety 01/18/2013  . Post-menopausal 08/14/2012  . Other screening mammogram 04/29/2011  . Routine general medical examination at a health care facility 04/22/2011  . ARTHRALGIA 02/28/2010  . Vitamin D deficiency 03/08/2009  . IRRITABLE BOWEL SYNDROME 06/27/2008  . B12 deficiency 02/23/2008  . POLYP, COLON 03/06/2007  . Hyperlipidemia 03/06/2007  . ALLERGIC RHINITIS 03/06/2007  . GERD 03/06/2007  . HIATAL HERNIA 03/06/2007  . LOW BACK PAIN 03/06/2007  . OSA (obstructive sleep apnea) 03/06/2007  . URINARY INCONTINENCE 03/06/2007  . MIGRAINES, HX OF 03/06/2007   Past Medical History:  Diagnosis Date  . Allergic rhinitis   . Allergy   . Anxiety   . B12 deficiency   . Degenerative disc disease    back  . Fungal infection 2005   lamasil   . GERD (gastroesophageal reflux  disease)    EGD 10/2004  . Hemorrhoids, internal    colonoscopy 02/2008  . Hyperlipemia   . Iron deficiency anemia   . Low back pain   . Obesity   . Sleep apnea    wears cpap  . Urinary incontinence   . Vitamin D deficiency    mild   Past Surgical History:  Procedure Laterality Date  . CESAREAN SECTION  1985  . COLONOSCOPY    . DILATION AND CURETTAGE OF UTERUS  2005  . HAND SURGERY  2000   nodule removed  . KNEE SURGERY  1970's   right  . NASAL SINUS SURGERY  2000's   right maxillary  . TONSILLECTOMY AND ADENOIDECTOMY     Social History   Tobacco Use  . Smoking status: Never Smoker  . Smokeless tobacco: Never Used  Substance Use Topics  . Alcohol use: Yes    Alcohol/week: 0.0 standard drinks    Comment: occasional  . Drug use: No   Family History  Problem Relation Age of Onset  . Coronary artery disease Father        in his 46's  . Arthritis Father   . Stroke Father   . Heart disease Father        pacemaker  . Prostate cancer Father   . Breast cancer Maternal Grandmother   . Colon cancer Other   . Colon polyps Neg Hx    Allergies  Allergen Reactions  . Atorvastatin     REACTION: myalgias  . Oxybutynin     REACTION: rash with  patch    Current Outpatient Medications on File Prior to Visit  Medication Sig Dispense Refill  . Calcium Carbonate-Vitamin D (CALTRATE 600+D) 600-400 MG-UNIT per tablet Take 1 tablet by mouth daily.      . Cholecalciferol (VITAMIN D-3 PO) Take 1 tablet by mouth daily.    . Cyanocobalamin (B-12 PO) Take by mouth daily.    . cyclobenzaprine (FLEXERIL) 10 MG tablet Take 0.5 tablets (5 mg total) by mouth 3 (three) times daily as needed. For muscle spasm and pain 30 tablet 5  . mometasone (ELOCON) 0.1 % cream   0  . QUDEXY XR 150 MG CS24 Take 1 tablet by mouth daily.    . SUMAtriptan (IMITREX) 20 MG/ACT nasal spray Place 20 mg into the nose every 2 (two) hours as needed for migraine. May repeat in 2 hours if headache persists or  recurs.     Current Facility-Administered Medications on File  Prior to Visit  Medication Dose Route Frequency Provider Last Rate Last Dose  . 0.9 %  sodium chloride infusion  500 mL Intravenous Once Doran Stabler, MD         Review of Systems  Constitutional: Positive for fatigue. Negative for activity change, appetite change, fever and unexpected weight change.  HENT: Negative for congestion, ear pain, rhinorrhea, sinus pressure and sore throat.   Eyes: Negative for pain, redness and visual disturbance.  Respiratory: Negative for cough, shortness of breath and wheezing.   Cardiovascular: Negative for chest pain and palpitations.  Gastrointestinal: Negative for abdominal pain, blood in stool, constipation and diarrhea.  Endocrine: Negative for polydipsia and polyuria.  Genitourinary: Negative for dysuria, frequency and urgency.  Musculoskeletal: Negative for arthralgias, back pain and myalgias.  Skin: Negative for pallor and rash.  Allergic/Immunologic: Negative for environmental allergies.  Neurological: Negative for dizziness, syncope and headaches.  Hematological: Negative for adenopathy. Does not bruise/bleed easily.  Psychiatric/Behavioral: Negative for decreased concentration and dysphoric mood. The patient is not nervous/anxious.        Objective:   Physical Exam  Constitutional: She appears well-developed and well-nourished. No distress.  obese and well appearing   HENT:  Head: Normocephalic and atraumatic.  Right Ear: External ear normal.  Left Ear: External ear normal.  Mouth/Throat: Oropharynx is clear and moist.  Eyes: Pupils are equal, round, and reactive to light. Conjunctivae and EOM are normal. No scleral icterus.  Neck: Normal range of motion. Neck supple. No JVD present. Carotid bruit is not present. No thyromegaly present.  Cardiovascular: Normal rate, regular rhythm, normal heart sounds and intact distal pulses. Exam reveals no gallop.  Pulmonary/Chest:  Effort normal and breath sounds normal. No stridor. No respiratory distress. She has no wheezes. She has no rales. She exhibits no tenderness. No breast tenderness, discharge or bleeding.  Abdominal: Soft. Bowel sounds are normal. She exhibits no distension, no abdominal bruit and no mass. There is no tenderness.  Genitourinary: No breast tenderness, discharge or bleeding.  Genitourinary Comments: Breast exam: No mass, nodules, thickening, tenderness, bulging, retraction, inflamation, nipple discharge or skin changes noted.  No axillary or clavicular LA.      Musculoskeletal: Normal range of motion. She exhibits no edema or tenderness.  Lymphadenopathy:    She has no cervical adenopathy.  Neurological: She is alert. She has normal reflexes. She displays normal reflexes. No cranial nerve deficit or sensory deficit. She exhibits normal muscle tone. Coordination normal.  Skin: Skin is warm and dry. No rash noted. No erythema. No pallor.  Solar lentigines diffusely   Psychiatric: She has a normal mood and affect.  Pleasant  Mood is good           Assessment & Plan:   Problem List Items Addressed This Visit      Other   B12 deficiency    Lab Results  Component Value Date   [id number] 07/29/2018    continue current supplementation       Hyperglycemia    Lab Results  Component Value Date   HGBA1C 5.2 07/29/2018   Stable disc imp of low glycemic diet and wt loss to prevent DM2       Hyperlipidemia    Disc goals for lipids and reasons to control them Rev last labs with pt Rev low sat fat diet in detail Continue crestor and diet-well controlled       Relevant Medications   hydrochlorothiazide (HYDRODIURIL) 25 MG tablet  rosuvastatin (CRESTOR) 10 MG tablet   Routine general medical examination at a health care facility - Primary    Reviewed health habits including diet and exercise and skin cancer prevention Reviewed appropriate screening tests for age  Also reviewed  health mt list, fam hx and immunization status , as well as social and family history   See HPI Labs reviewed Don't forget to get your flu shot in the fall   Also -schedule your mammogram next month   Take care of yourself  Keep working on healthy diet and exercise the best you can       Vitamin D deficiency    D level of 38 Vitamin D level is therapeutic with current supplementation Disc importance of this to bone and overall health

## 2018-08-24 ENCOUNTER — Other Ambulatory Visit: Payer: Self-pay | Admitting: Family Medicine

## 2018-08-24 DIAGNOSIS — Z1231 Encounter for screening mammogram for malignant neoplasm of breast: Secondary | ICD-10-CM

## 2018-09-03 ENCOUNTER — Ambulatory Visit: Payer: No Typology Code available for payment source

## 2018-09-11 ENCOUNTER — Ambulatory Visit
Admission: RE | Admit: 2018-09-11 | Discharge: 2018-09-11 | Disposition: A | Discharge status: Home / Self Care | Payer: No Typology Code available for payment source | Source: Ambulatory Visit | Attending: Family Medicine | Admitting: Family Medicine

## 2018-09-11 DIAGNOSIS — Z1231 Encounter for screening mammogram for malignant neoplasm of breast: Secondary | ICD-10-CM | POA: Insufficient documentation

## 2019-01-15 ENCOUNTER — Encounter: Payer: Self-pay | Admitting: Internal Medicine

## 2019-01-15 ENCOUNTER — Ambulatory Visit: Payer: No Typology Code available for payment source | Admitting: Internal Medicine

## 2019-01-15 VITALS — BP 110/80 | HR 78 | Temp 98.3°F | Resp 18 | Ht 67.0 in | Wt 218.0 lb

## 2019-01-15 DIAGNOSIS — J069 Acute upper respiratory infection, unspecified: Secondary | ICD-10-CM | POA: Diagnosis not present

## 2019-01-15 NOTE — Progress Notes (Signed)
Subjective:    Patient ID: Pamela Anthony, female    DOB: [birth year], 63 y.o.   MRN: [id number]  HPI Here due to respiratory illness  Started 3 days ago Runny nose, "felt awful" Tried alka seltzer---helped a little Sleeping more--to get rest Now feels it in the chest--like bronchitis Some cough now--dry No real SOB No fever now---?slight at first Awoke with sweat last night Hasn't been able to visit husband in hospital  Current Outpatient Medications on File Prior to Visit  Medication Sig Dispense Refill  . Calcium Carbonate-Vitamin D (CALTRATE 600+D) 600-400 MG-UNIT per tablet Take 1 tablet by mouth daily.      . Cholecalciferol (VITAMIN D-3 PO) Take 1 tablet by mouth daily.    . Cyanocobalamin (B-12 PO) Take by mouth daily.    . cyclobenzaprine (FLEXERIL) 10 MG tablet Take 0.5 tablets (5 mg total) by mouth 3 (three) times daily as needed. For muscle spasm and pain 30 tablet 5  . FLUoxetine (PROZAC) 20 MG capsule Take 2 capsules (40 mg total) by mouth daily. 180 capsule 3  . hydrochlorothiazide (HYDRODIURIL) 25 MG tablet TAKE ONE-HALF TO ONE TABLET BY MOUTH ONCE DAILY AS NEEDED 90 tablet 3  . mometasone (ELOCON) 0.1 % cream   0  . omeprazole (PRILOSEC) 20 MG capsule Take 1 capsule (20 mg total) by mouth daily. 90 capsule 3  . oxybutynin (DITROPAN-XL) 10 MG 24 hr tablet Take 1 tablet (10 mg total) by mouth at bedtime. 90 tablet 3  . QUDEXY XR 150 MG CS24 Take 1 tablet by mouth daily.    . rosuvastatin (CRESTOR) 10 MG tablet Take 1 tablet (10 mg total) by mouth daily. 90 tablet 3  . SUMAtriptan (IMITREX) 20 MG/ACT nasal spray Place 20 mg into the nose every 2 (two) hours as needed for migraine. May repeat in 2 hours if headache persists or recurs.     No current facility-administered medications on file prior to visit.     Allergies  Allergen Reactions  . Atorvastatin     REACTION: myalgias  . Oxybutynin     REACTION: rash with  patch     Past Medical History:  Diagnosis  Date  . Allergic rhinitis   . Allergy   . Anxiety   . B12 deficiency   . Degenerative disc disease    back  . Fungal infection 2005   lamasil   . GERD (gastroesophageal reflux disease)    EGD 10/2004  . Hemorrhoids, internal    colonoscopy 02/2008  . Hyperlipemia   . Iron deficiency anemia   . Low back pain   . Obesity   . Sleep apnea    wears cpap  . Urinary incontinence   . Vitamin D deficiency    mild    Past Surgical History:  Procedure Laterality Date  . CESAREAN SECTION  1985  . COLONOSCOPY    . DILATION AND CURETTAGE OF UTERUS  2005  . HAND SURGERY  2000   nodule removed  . KNEE SURGERY  1970's   right  . NASAL SINUS SURGERY  2000's   right maxillary  . TONSILLECTOMY AND ADENOIDECTOMY      Family History  Problem Relation Age of Onset  . Coronary artery disease Father        in his 4's  . Arthritis Father   . Stroke Father   . Heart disease Father        pacemaker  . Prostate cancer Father   .  Breast cancer Maternal Grandmother   . Colon cancer Other   . Colon polyps Neg Hx     Social History   Socioeconomic History  . Marital status: Married    Spouse name: Not on file  . Number of children: 2  . Years of education: Not on file  . Highest education level: Not on file  Occupational History  . Occupation: Therapist, art  Social Needs  . Financial resource strain: Not on file  . Food insecurity:    Worry: Not on file    Inability: Not on file  . Transportation needs:    Medical: Not on file    Non-medical: Not on file  Tobacco Use  . Smoking status: Never Smoker  . Smokeless tobacco: Never Used  Substance and Sexual Activity  . Alcohol use: Yes    Alcohol/week: 0.0 standard drinks    Comment: occasional  . Drug use: No  . Sexual activity: Not on file  Lifestyle  . Physical activity:    Days per week: Not on file    Minutes per session: Not on file  . Stress: Not on file  Relationships  . Social connections:    Talks on  phone: Not on file    Gets together: Not on file    Attends religious service: Not on file    Active member of club or organization: Not on file    Attends meetings of clubs or organizations: Not on file    Relationship status: Not on file  . Intimate partner violence:    Fear of current or ex partner: Not on file    Emotionally abused: Not on file    Physically abused: Not on file    Forced sexual activity: Not on file  Other Topics Concern  . Not on file  Social History Narrative  . Not on file   Review of Systems No new body aches No vomiting or diarrhea Appetite is off     Objective:   Physical Exam  Constitutional: She appears well-developed. No distress.  HENT:  No sinus tenderness TMs normal Mild nasal inflammation Slight pharyngeal injection  Neck: No thyromegaly present.  Respiratory: Effort normal and breath sounds normal. No respiratory distress. She has no wheezes. She has no rales.  Lymphadenopathy:    She has no cervical adenopathy.           Assessment & Plan:

## 2019-01-15 NOTE — Assessment & Plan Note (Signed)
Discussed supportive care--analgesics, etc If worsens next week, would consider antibiotic Rx--doxy or z-pak (tolerated in past)

## 2019-03-08 ENCOUNTER — Other Ambulatory Visit: Payer: Self-pay

## 2019-03-08 ENCOUNTER — Ambulatory Visit (INDEPENDENT_AMBULATORY_CARE_PROVIDER_SITE_OTHER): Payer: No Typology Code available for payment source | Admitting: Family Medicine

## 2019-03-08 ENCOUNTER — Encounter: Payer: Self-pay | Admitting: Family Medicine

## 2019-03-08 DIAGNOSIS — F418 Other specified anxiety disorders: Secondary | ICD-10-CM

## 2019-03-08 MED ORDER — FLUOXETINE HCL 20 MG PO CAPS: 60.0000 mg | ORAL_CAPSULE | Freq: Every day | ORAL | 3 refills | Status: DC

## 2019-03-08 NOTE — Progress Notes (Signed)
Virtual Visit via Video Note  I connected with Pamela Anthony on 03/08/19 at 11:30 AM EDT by a video enabled telemedicine application and verified that I am speaking with the correct person using two identifiers. She is in her car today (parking lot of her work place) and I am in the office  I discussed the limitations of evaluation and management by telemedicine and the availability of in person appointments. The patient expressed understanding and agreed to proceed.  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  No options for change  She has not worked for a new job  Goal is to work to 67  Is anxious  Sleep is fairly good  Walking for exercise   She is tearful at times  Some sadness  Some feelings of hopelessness (situation at home)  No SI   Feels achey and tired at times   Her insides shake when she is at work   She does take long acting topamax product for migraine  She does not think it adds to her depression symptoms and does not cause brain fog impairing work performance   Has been on prozac 40 mg - no side effects   Review of Systems  Constitutional:       Fatigue w/o malaise   Eyes: Negative for blurred vision.  Respiratory: Negative for cough and shortness of breath.   Cardiovascular: Negative for chest pain and palpitations.  Gastrointestinal: Negative for diarrhea, nausea and vomiting.       Upset stomach (jittery) when stressed  Musculoskeletal: Positive for joint pain.  Skin: Negative for rash.  Neurological: Positive for headaches. Negative for dizziness, tremors,  speech change, focal weakness and weakness.  Psychiatric/Behavioral: Positive for depression. Negative for memory loss, substance abuse and suicidal ideas. The patient is nervous/anxious. The patient does not have insomnia.       Patient Active Problem List   Diagnosis Date Noted  . Colon cancer screening 03/18/2018  . Fatigue 03/18/2018  . Palpitations 06/20/2015  . Globus sensation 06/20/2015  . Thoracic back pain 06/20/2015  . Hyperglycemia 03/16/2015  . Somnolence 01/16/2015  . Stress reaction 01/16/2015  . Neck pain 07/04/2014  . Encounter for routine gynecological examination 09/01/2013  . Depression with anxiety 01/18/2013  . Post-menopausal 08/14/2012  . Other screening mammogram 04/29/2011  . Routine general medical examination at a health care facility 04/22/2011  . ARTHRALGIA 02/28/2010  . Vitamin D deficiency 03/08/2009  . IRRITABLE BOWEL SYNDROME 06/27/2008  . B12 deficiency 02/23/2008  . POLYP, COLON 03/06/2007  . Hyperlipidemia 03/06/2007  . ALLERGIC RHINITIS 03/06/2007  . GERD 03/06/2007  . HIATAL HERNIA 03/06/2007  . LOW BACK PAIN 03/06/2007  . OSA (obstructive sleep apnea) 03/06/2007  . URINARY INCONTINENCE 03/06/2007  . MIGRAINES, HX OF 03/06/2007   Past Medical History:  Diagnosis Date  . Allergic rhinitis   . Allergy   . Anxiety   . B12 deficiency   . Degenerative disc disease    back  . Fungal  infection 2005   lamasil   . GERD (gastroesophageal reflux disease)    EGD 10/2004  . Hemorrhoids, internal    colonoscopy 02/2008  . Hyperlipemia   . Iron deficiency anemia   . Low back pain   . Obesity   . Sleep apnea    wears cpap  . Urinary incontinence   . Vitamin D deficiency    mild   Past Surgical History:  Procedure Laterality Date  . CESAREAN SECTION  1985  . COLONOSCOPY    . DILATION AND CURETTAGE OF UTERUS  2005  . HAND SURGERY  2000   nodule removed  . KNEE SURGERY  1970's   right  . NASAL SINUS SURGERY  2000's   right  maxillary  . TONSILLECTOMY AND ADENOIDECTOMY     Social History   Tobacco Use  . Smoking status: Never Smoker  . Smokeless tobacco: Never Used  Substance Use Topics  . Alcohol use: Yes    Alcohol/week: 0.0 standard drinks    Comment: occasional  . Drug use: No   Family History  Problem Relation Age of Onset  . Coronary artery disease Father        in his 47's  . Arthritis Father   . Stroke Father   . Heart disease Father        pacemaker  . Prostate cancer Father   . Breast cancer Maternal Grandmother   . Colon cancer Other   . Colon polyps Neg Hx    Allergies  Allergen Reactions  . Atorvastatin     REACTION: myalgias  . Oxybutynin     REACTION: rash with  patch    Current Outpatient Medications on File Prior to Visit  Medication Sig Dispense Refill  . Calcium Carbonate-Vitamin D (CALTRATE 600+D) 600-400 MG-UNIT per tablet Take 1 tablet by mouth daily.      . Cholecalciferol (VITAMIN D-3 PO) Take 1 tablet by mouth daily.    . Cyanocobalamin (B-12 PO) Take by mouth daily.    . hydrochlorothiazide (HYDRODIURIL) 25 MG tablet TAKE ONE-HALF TO ONE TABLET BY MOUTH ONCE DAILY AS NEEDED 90 tablet 3  . mometasone (ELOCON) 0.1 % cream   0  . omeprazole (PRILOSEC) 20 MG capsule Take 1 capsule (20 mg total) by mouth daily. 90 capsule 3  . oxybutynin (DITROPAN-XL) 10 MG 24 hr tablet Take 1 tablet (10 mg total) by mouth at bedtime. 90 tablet 3  . rosuvastatin (CRESTOR) 10 MG tablet Take 1 tablet (10 mg total) by mouth daily. 90 tablet 3  . SUMAtriptan (IMITREX) 20 MG/ACT nasal spray Place 20 mg into the nose every 2 (two) hours as needed for migraine. May repeat in 2 hours if headache persists or recurs.    . topiramate (TOPAMAX) 100 MG tablet Take 150 mg by mouth at bedtime.    . cyclobenzaprine (FLEXERIL) 10 MG tablet Take 0.5 tablets (5 mg total) by mouth 3 (three) times daily as needed. For muscle spasm and pain (Patient not taking: Reported on 03/08/2019) 30 tablet 5   No  current facility-administered medications on file prior to visit.     Observations/Objective: Pt is tearful at times when discussing her stressors Pleasant  Looks well kempt and overall not in distress   Assessment and Plan: Problem List Items Addressed This Visit      Other   Depression with anxiety - Primary    Symptoms from both have worsened significantly in setting of stressors (work/home)  Cannot get out  of toxic work environment and wants to retire at 41  Reviewed stressors/ coping techniques/symptoms/ support sources/ tx options and side effects in detail today Ref made to counseling  Will plan to inc fluoxetine to 60 mg daily  Discussed expectations of SSRI medication including time to effectiveness and mechanism of action, also poss of side effects (early and late)- including mental fuzziness, weight or appetite change, nausea and poss of worse dep or anxiety (even suicidal thoughts)  Pt voiced understanding and will stop med and update if this occurs   inst to update [place] in 2-4 weeks re: how she is doing  Self care was encouraged-esp exercise (she is walking) and outdoor time       Relevant Medications   FLUoxetine (PROZAC) 20 MG capsule   Other Relevant Orders   Ambulatory referral to Psychology       Follow Up Instructions: Increase fluoxetine to 60 mg once daily  We will refer you for counseling -you will get a call Continue walking and good self care Keep talking to co workers and family     I discussed the assessment and treatment plan with the patient. The patient was provided an opportunity to ask questions and all were answered. The patient agreed with the plan and demonstrated an understanding of the instructions.   The patient was advised to call back or seek an in-person evaluation if the symptoms worsen or if the condition fails to improve as anticipated.    Loura Pardon, MD

## 2019-03-08 NOTE — Assessment & Plan Note (Signed)
Symptoms from both have worsened significantly in setting of stressors (work/home)  Cannot get out of toxic work environment and wants to retire at 9  Reviewed stressors/ coping techniques/symptoms/ support sources/ tx options and side effects in detail today Ref made to counseling  Will plan to inc fluoxetine to 60 mg daily  Discussed expectations of SSRI medication including time to effectiveness and mechanism of action, also poss of side effects (early and late)- including mental fuzziness, weight or appetite change, nausea and poss of worse dep or anxiety (even suicidal thoughts)  Pt voiced understanding and will stop med and update if this occurs   inst to update [place] in 2-4 weeks re: how she is doing  Self care was encouraged-esp exercise (she is walking) and outdoor time

## 2019-03-31 ENCOUNTER — Encounter: Payer: Self-pay | Admitting: Family Medicine

## 2019-04-27 ENCOUNTER — Encounter: Payer: Self-pay | Admitting: Family Medicine

## 2019-06-25 DIAGNOSIS — H5340 Unspecified visual field defects: Secondary | ICD-10-CM | POA: Diagnosis not present

## 2019-06-25 DIAGNOSIS — H02839 Dermatochalasis of unspecified eye, unspecified eyelid: Secondary | ICD-10-CM | POA: Diagnosis not present

## 2019-06-25 DIAGNOSIS — H02403 Unspecified ptosis of bilateral eyelids: Secondary | ICD-10-CM | POA: Diagnosis not present

## 2019-08-04 ENCOUNTER — Other Ambulatory Visit: Payer: Self-pay | Admitting: Family Medicine

## 2019-08-17 DIAGNOSIS — H02409 Unspecified ptosis of unspecified eyelid: Secondary | ICD-10-CM | POA: Diagnosis not present

## 2019-08-20 ENCOUNTER — Ambulatory Visit (INDEPENDENT_AMBULATORY_CARE_PROVIDER_SITE_OTHER): Payer: BC Managed Care – PPO

## 2019-08-20 DIAGNOSIS — Z23 Encounter for immunization: Secondary | ICD-10-CM | POA: Diagnosis not present

## 2019-09-03 ENCOUNTER — Other Ambulatory Visit: Payer: Self-pay | Admitting: Family Medicine

## 2019-09-06 DIAGNOSIS — H02839 Dermatochalasis of unspecified eye, unspecified eyelid: Secondary | ICD-10-CM | POA: Diagnosis not present

## 2019-09-06 DIAGNOSIS — H02403 Unspecified ptosis of bilateral eyelids: Secondary | ICD-10-CM | POA: Diagnosis not present

## 2019-09-06 DIAGNOSIS — H5340 Unspecified visual field defects: Secondary | ICD-10-CM | POA: Diagnosis not present

## 2019-09-09 DIAGNOSIS — H5340 Unspecified visual field defects: Secondary | ICD-10-CM | POA: Insufficient documentation

## 2019-09-09 DIAGNOSIS — H02839 Dermatochalasis of unspecified eye, unspecified eyelid: Secondary | ICD-10-CM | POA: Insufficient documentation

## 2019-09-09 DIAGNOSIS — H02403 Unspecified ptosis of bilateral eyelids: Secondary | ICD-10-CM | POA: Insufficient documentation

## 2019-09-15 ENCOUNTER — Other Ambulatory Visit: Payer: Self-pay | Admitting: Family Medicine

## 2019-09-15 DIAGNOSIS — Z1231 Encounter for screening mammogram for malignant neoplasm of breast: Secondary | ICD-10-CM

## 2019-09-28 ENCOUNTER — Ambulatory Visit
Admission: RE | Admit: 2019-09-28 | Discharge: 2019-09-28 | Disposition: A | Discharge status: Home / Self Care | Payer: BC Managed Care – PPO | Source: Ambulatory Visit | Attending: Family Medicine | Admitting: Family Medicine

## 2019-09-28 ENCOUNTER — Other Ambulatory Visit: Payer: Self-pay

## 2019-09-28 DIAGNOSIS — Z1231 Encounter for screening mammogram for malignant neoplasm of breast: Secondary | ICD-10-CM

## 2019-10-19 ENCOUNTER — Telehealth: Payer: Self-pay | Admitting: Family Medicine

## 2019-10-19 DIAGNOSIS — R7303 Prediabetes: Secondary | ICD-10-CM

## 2019-10-19 DIAGNOSIS — E559 Vitamin D deficiency, unspecified: Secondary | ICD-10-CM

## 2019-10-19 DIAGNOSIS — E7800 Pure hypercholesterolemia, unspecified: Secondary | ICD-10-CM

## 2019-10-19 DIAGNOSIS — Z0000 Encounter for general adult medical examination without abnormal findings: Secondary | ICD-10-CM

## 2019-10-19 DIAGNOSIS — E538 Deficiency of other specified B group vitamins: Secondary | ICD-10-CM

## 2019-10-19 NOTE — Telephone Encounter (Signed)
-----   Message from Ellamae Sia sent at 10/14/2019  9:12 AM EST ----- Regarding: Lab orders for Wednesday, 11.11.20 Patient is scheduled for CPX labs, please order future labs, Thanks , Karna Christmas

## 2019-10-20 ENCOUNTER — Other Ambulatory Visit (INDEPENDENT_AMBULATORY_CARE_PROVIDER_SITE_OTHER): Payer: BC Managed Care – PPO

## 2019-10-20 ENCOUNTER — Other Ambulatory Visit: Payer: Self-pay

## 2019-10-20 DIAGNOSIS — Z0000 Encounter for general adult medical examination without abnormal findings: Secondary | ICD-10-CM | POA: Diagnosis not present

## 2019-10-20 DIAGNOSIS — E538 Deficiency of other specified B group vitamins: Secondary | ICD-10-CM

## 2019-10-20 DIAGNOSIS — E7800 Pure hypercholesterolemia, unspecified: Secondary | ICD-10-CM

## 2019-10-20 DIAGNOSIS — E559 Vitamin D deficiency, unspecified: Secondary | ICD-10-CM | POA: Diagnosis not present

## 2019-10-20 DIAGNOSIS — R7303 Prediabetes: Secondary | ICD-10-CM | POA: Diagnosis not present

## 2019-10-20 LAB — TSH: TSH: 2.26 u[IU]/mL (ref 0.35–4.50)

## 2019-10-20 LAB — LIPID PANEL
Cholesterol: 129 mg/dL (ref 0–200)
HDL: 41.3 mg/dL (ref >39.00)
LDL Cholesterol: 68 mg/dL (ref 0–99)
NonHDL: 87.86
Total CHOL/HDL Ratio: 3
Triglycerides: 98 mg/dL (ref 0.0–149.0)
VLDL: 19.6 mg/dL (ref 0.0–40.0)

## 2019-10-20 LAB — CBC WITH DIFFERENTIAL/PLATELET
Basophils Absolute: 0 10*3/uL (ref 0.0–0.1)
Basophils Relative: 0.7 % (ref 0.0–3.0)
Eosinophils Absolute: 0.1 10*3/uL (ref 0.0–0.7)
Eosinophils Relative: 2 % (ref 0.0–5.0)
HCT: 38.5 % (ref 36.0–46.0)
Hemoglobin: 12.9 g/dL (ref 12.0–15.0)
Lymphocytes Relative: 20.3 % (ref 12.0–46.0)
Lymphs Abs: 0.9 10*3/uL (ref 0.7–4.0)
MCHC: 33.6 g/dL (ref 30.0–36.0)
MCV: 87.4 fl (ref 78.0–100.0)
Monocytes Absolute: 0.3 10*3/uL (ref 0.1–1.0)
Monocytes Relative: 5.9 % (ref 3.0–12.0)
Neutro Abs: 3.3 10*3/uL (ref 1.4–7.7)
Neutrophils Relative %: 71.1 % (ref 43.0–77.0)
Platelets: 234 10*3/uL (ref 150.0–400.0)
RBC: 4.4 Mil/uL (ref 3.87–5.11)
RDW: 13.9 % (ref 11.5–15.5)
WBC: 4.6 10*3/uL (ref 4.0–10.5)

## 2019-10-20 LAB — HEMOGLOBIN A1C: Hgb A1c MFr Bld: 5.2 % (ref 4.6–6.5)

## 2019-10-20 LAB — VITAMIN B12: Vitamin B-12: 681 pg/mL (ref 211–911)

## 2019-10-20 LAB — COMPREHENSIVE METABOLIC PANEL
ALT: 33 U/L (ref 0–35)
AST: 32 U/L (ref 0–37)
Albumin: 4.4 g/dL (ref 3.5–5.2)
Alkaline Phosphatase: 76 U/L (ref 39–117)
BUN: 21 mg/dL (ref 6–23)
CO2: 27 mEq/L (ref 19–32)
Calcium: 9 mg/dL (ref 8.4–10.5)
Chloride: 104 mEq/L (ref 96–112)
Creatinine, Ser: 0.84 mg/dL (ref 0.40–1.20)
GFR: 68.56 mL/min (ref >60.00)
Glucose, Bld: 108 mg/dL — ABNORMAL HIGH (ref 70–99)
Potassium: 3.7 mEq/L (ref 3.5–5.1)
Sodium: 141 mEq/L (ref 135–145)
Total Bilirubin: 0.6 mg/dL (ref 0.2–1.2)
Total Protein: 7 g/dL (ref 6.0–8.3)

## 2019-10-20 LAB — VITAMIN D 25 HYDROXY (VIT D DEFICIENCY, FRACTURES): VITD: 53.03 ng/mL (ref 30.00–100.00)

## 2019-10-27 ENCOUNTER — Other Ambulatory Visit: Payer: Self-pay

## 2019-10-27 ENCOUNTER — Ambulatory Visit (INDEPENDENT_AMBULATORY_CARE_PROVIDER_SITE_OTHER): Payer: BC Managed Care – PPO | Admitting: Family Medicine

## 2019-10-27 ENCOUNTER — Encounter: Payer: Self-pay | Admitting: Family Medicine

## 2019-10-27 VITALS — BP 126/78 | HR 62 | Temp 96.4°F | Ht 66.25 in | Wt 211.6 lb

## 2019-10-27 DIAGNOSIS — F418 Other specified anxiety disorders: Secondary | ICD-10-CM

## 2019-10-27 DIAGNOSIS — R7303 Prediabetes: Secondary | ICD-10-CM

## 2019-10-27 DIAGNOSIS — E7800 Pure hypercholesterolemia, unspecified: Secondary | ICD-10-CM

## 2019-10-27 DIAGNOSIS — E538 Deficiency of other specified B group vitamins: Secondary | ICD-10-CM | POA: Diagnosis not present

## 2019-10-27 DIAGNOSIS — Z0000 Encounter for general adult medical examination without abnormal findings: Secondary | ICD-10-CM | POA: Diagnosis not present

## 2019-10-27 DIAGNOSIS — E559 Vitamin D deficiency, unspecified: Secondary | ICD-10-CM

## 2019-10-27 DIAGNOSIS — Z23 Encounter for immunization: Secondary | ICD-10-CM

## 2019-10-27 DIAGNOSIS — Z01419 Encounter for gynecological examination (general) (routine) without abnormal findings: Secondary | ICD-10-CM

## 2019-10-27 MED ORDER — HYDROCHLOROTHIAZIDE 25 MG PO TABS: ORAL_TABLET | ORAL | 3 refills | Status: DC

## 2019-10-27 MED ORDER — OMEPRAZOLE 20 MG PO CPDR: 20.0000 mg | DELAYED_RELEASE_CAPSULE | Freq: Every day | ORAL | 3 refills | Status: DC

## 2019-10-27 MED ORDER — OXYBUTYNIN CHLORIDE ER 10 MG PO TB24: 10.0000 mg | ORAL_TABLET | Freq: Every day | ORAL | 3 refills | Status: DC

## 2019-10-27 MED ORDER — ROSUVASTATIN CALCIUM 10 MG PO TABS: 10.0000 mg | ORAL_TABLET | Freq: Every day | ORAL | 3 refills | Status: DC

## 2019-10-27 NOTE — Assessment & Plan Note (Signed)
Vitamin D level is therapeutic with current supplementation Disc importance of this to bone and overall health Level in 50s 

## 2019-10-27 NOTE — Assessment & Plan Note (Signed)
Disc goals for lipids and reasons to control them Rev last labs with pt Rev low sat fat diet in detail  Well controlled with crestor and improved diet

## 2019-10-27 NOTE — Patient Instructions (Addendum)
For weight loss and diabetes prevention  Try to get most of your carbohydrates from produce (with the exception of white potatoes)  Eat less bread/pasta/rice/snack foods/cereals/sweets and other items from the middle of the grocery store (processed carbs)   Let's update your pneumovax 23 today   Think about an exercise program for home so you can stay fit   Keep eating healthy   Stay safe during the pandemic

## 2019-10-27 NOTE — Progress Notes (Signed)
Subjective:    Patient ID: Pamela Anthony, female    DOB: [birth year], 63 y.o.   MRN: [id number]  HPI Here for health maintenance exam and to review chronic medical problems    Husband had a heart transplant and is doing well  She has been home for several months taking care of him  Less stress- and feeling well   She had nasal surgery- a big relief after that  Very happy with it  Next week has eyelid surgery   covid test is on Monday in prep for her surgery   Getting ready to go back to work  Work is her biggest stress   Would like to retire at [employer] Readings from Last 3 Encounters:  10/27/19 211 lb 9 oz (96 kg)  01/15/19 218 lb (98.9 kg)  08/03/18 221 lb 12.8 oz (100.6 kg)  wt is down 9 lb  She is making the effort  Walks a lot and doing some things around the house  33.89 kg/m   Had shingrix vaccines   pna 23 was 5 y ago  Pap 9/14 -negative She does not see a gyn  Not sexually active  No problems or symptoms  She wants to skip the gyn exam   No h/o of any abn paps   Mammogram 10/20  Self breast exam- no lumps   Tdap 9/15  Flu shot 9/20   Colonoscopy 5/19   BP Readings from Last 3 Encounters:  10/27/19 126/78  01/15/19 110/80  08/03/18 128/88   Pulse Readings from Last 3 Encounters:  10/27/19 62  01/15/19 78  08/03/18 78   H/o B12 def Lab Results  Component Value Date   VITAMINB12 681 10/20/2019   Vit D def Level of 53 with current supplementation   Mood H/o anx and depression  Much less stressed !  Doing very well   Hyperlipidemia Lab Results  Component Value Date   CHOL 129 10/20/2019   CHOL 135 07/29/2018   CHOL 130 07/22/2017   Lab Results  Component Value Date   HDL 41.30 10/20/2019   HDL 41.30 07/29/2018   HDL 46.20 07/22/2017   Lab Results  Component Value Date   LDLCALC 68 10/20/2019   LDLCALC 63 07/29/2018   LDLCALC 58 07/22/2017   Lab Results  Component Value Date   TRIG 98.0 10/20/2019   TRIG 154.0 (H)  07/29/2018   TRIG 126.0 07/22/2017   Lab Results  Component Value Date   CHOLHDL 3 10/20/2019   CHOLHDL 3 07/29/2018   CHOLHDL 3 07/22/2017   Lab Results  Component Value Date   LDLDIRECT 182.2 08/25/2013   LDLDIRECT 183.6 08/07/2012   LDLDIRECT 170.8 04/23/2011   crestor an diet  Staying in good control   Prediabetes Lab Results  Component Value Date   HGBA1C 5.2 10/20/2019  too much sweets/breads   Glucose of 108 at time of test   Lab Results  Component Value Date   WBC 4.6 10/20/2019   HGB 12.9 10/20/2019   HCT 38.5 10/20/2019   MCV 87.4 10/20/2019   PLT 234.0 10/20/2019   Lab Results  Component Value Date   TSH 2.26 10/20/2019    Lab Results  Component Value Date   CREATININE 0.84 10/20/2019   BUN 21 10/20/2019   NA 141 10/20/2019   K 3.7 10/20/2019   CL 104 10/20/2019   CO2 27 10/20/2019   Lab Results  Component Value Date   ALT 33  10/20/2019   AST 32 10/20/2019   ALKPHOS 76 10/20/2019   BILITOT 0.6 10/20/2019     Patient Active Problem List   Diagnosis Date Noted  . Colon cancer screening 03/18/2018  . Fatigue 03/18/2018  . Palpitations 06/20/2015  . Globus sensation 06/20/2015  . Thoracic back pain 06/20/2015  . Prediabetes 03/16/2015  . Somnolence 01/16/2015  . Stress reaction 01/16/2015  . Neck pain 07/04/2014  . Encounter for routine gynecological examination 09/01/2013  . Depression with anxiety 01/18/2013  . Post-menopausal 08/14/2012  . Other screening mammogram 04/29/2011  . Routine general medical examination at a health care facility 04/22/2011  . ARTHRALGIA 02/28/2010  . Vitamin D deficiency 03/08/2009  . IRRITABLE BOWEL SYNDROME 06/27/2008  . B12 deficiency 02/23/2008  . POLYP, COLON 03/06/2007  . Hyperlipidemia 03/06/2007  . ALLERGIC RHINITIS 03/06/2007  . GERD 03/06/2007  . HIATAL HERNIA 03/06/2007  . LOW BACK PAIN 03/06/2007  . OSA (obstructive sleep apnea) 03/06/2007  . URINARY INCONTINENCE 03/06/2007  .  MIGRAINES, HX OF 03/06/2007   Past Medical History:  Diagnosis Date  . Allergic rhinitis   . Allergy   . Anxiety   . B12 deficiency   . Degenerative disc disease    back  . Fungal infection 2005   lamasil   . GERD (gastroesophageal reflux disease)    EGD 10/2004  . Hemorrhoids, internal    colonoscopy 02/2008  . Hyperlipemia   . Iron deficiency anemia   . Low back pain   . Obesity   . Sleep apnea    wears cpap  . Urinary incontinence   . Vitamin D deficiency    mild   Past Surgical History:  Procedure Laterality Date  . CESAREAN SECTION  1985  . COLONOSCOPY    . DILATION AND CURETTAGE OF UTERUS  2005  . HAND SURGERY  2000   nodule removed  . KNEE SURGERY  1970's   right  . NASAL SINUS SURGERY  2000's   right maxillary  . TONSILLECTOMY AND ADENOIDECTOMY     Social History   Tobacco Use  . Smoking status: Never Smoker  . Smokeless tobacco: Never Used  Substance Use Topics  . Alcohol use: Yes    Alcohol/week: 0.0 standard drinks    Comment: occasional  . Drug use: No   Family History  Problem Relation Age of Onset  . Coronary artery disease Father        in his 47's  . Arthritis Father   . Stroke Father   . Heart disease Father        pacemaker  . Prostate cancer Father   . Breast cancer Maternal Grandmother   . Colon cancer Other   . Colon polyps Neg Hx    Allergies  Allergen Reactions  . Atorvastatin     REACTION: myalgias  . Oxybutynin     REACTION: rash with  patch    Current Outpatient Medications on File Prior to Visit  Medication Sig Dispense Refill  . Calcium Carbonate-Vitamin D (CALTRATE 600+D) 600-400 MG-UNIT per tablet Take 1 tablet by mouth daily.      . Cholecalciferol (VITAMIN D-3 PO) Take 1 tablet by mouth daily.    . Cyanocobalamin (B-12 PO) Take by mouth daily.    . cyclobenzaprine (FLEXERIL) 10 MG tablet Take 0.5 tablets (5 mg total) by mouth 3 (three) times daily as needed. For muscle spasm and pain 30 tablet 5  . FLUoxetine  (PROZAC) 20 MG capsule Take 3 capsules (  60 mg total) by mouth daily. 270 capsule 3  . mometasone (ELOCON) 0.1 % cream   0  . SUMAtriptan (IMITREX) 20 MG/ACT nasal spray Place 20 mg into the nose every 2 (two) hours as needed for migraine. May repeat in 2 hours if headache persists or recurs.    . topiramate (TOPAMAX) 100 MG tablet Take 150 mg by mouth at bedtime.     No current facility-administered medications on file prior to visit.     Review of Systems  Constitutional: Negative for activity change, appetite change, fatigue, fever and unexpected weight change.  HENT: Negative for congestion, ear pain, rhinorrhea, sinus pressure and sore throat.   Eyes: Negative for pain, redness and visual disturbance.  Respiratory: Negative for cough, shortness of breath and wheezing.   Cardiovascular: Negative for chest pain and palpitations.  Gastrointestinal: Negative for abdominal pain, blood in stool, constipation and diarrhea.  Endocrine: Negative for polydipsia and polyuria.  Genitourinary: Negative for dysuria, frequency and urgency.  Musculoskeletal: Negative for arthralgias, back pain and myalgias.  Skin: Negative for pallor and rash.  Allergic/Immunologic: Negative for environmental allergies.  Neurological: Negative for dizziness, syncope and headaches.  Hematological: Negative for adenopathy. Does not bruise/bleed easily.  Psychiatric/Behavioral: Negative for decreased concentration and dysphoric mood. The patient is not nervous/anxious.        Objective:   Physical Exam Constitutional:      General: She is not in acute distress.    Appearance: Normal appearance. She is well-developed. She is obese. She is not ill-appearing or diaphoretic.  HENT:     Head: Normocephalic and atraumatic.     Right Ear: Tympanic membrane, ear canal and external ear normal.     Left Ear: Tympanic membrane, ear canal and external ear normal.     Nose: Nose normal. No congestion.     Comments: Recent  nasal surgery noted Better air flow    Mouth/Throat:     Mouth: Mucous membranes are moist.     Pharynx: Oropharynx is clear. No posterior oropharyngeal erythema.  Eyes:     General: No scleral icterus.    Extraocular Movements: Extraocular movements intact.     Conjunctiva/sclera: Conjunctivae normal.     Pupils: Pupils are equal, round, and reactive to light.  Neck:     Musculoskeletal: Normal range of motion and neck supple. No neck rigidity or muscular tenderness.     Thyroid: No thyromegaly.     Vascular: No carotid bruit or JVD.  Cardiovascular:     Rate and Rhythm: Normal rate and regular rhythm.     Pulses: Normal pulses.     Heart sounds: Normal heart sounds. No gallop.   Pulmonary:     Effort: Pulmonary effort is normal. No respiratory distress.     Breath sounds: Normal breath sounds. No wheezing.     Comments: Good air exch Chest:     Chest wall: No tenderness.  Abdominal:     General: Bowel sounds are normal. There is no distension or abdominal bruit.     Palpations: Abdomen is soft. There is no mass.     Tenderness: There is no abdominal tenderness.     Hernia: No hernia is present.  Genitourinary:    Comments: Breast exam: No mass, nodules, thickening, tenderness, bulging, retraction, inflamation, nipple discharge or skin changes noted.  No axillary or clavicular LA.     Musculoskeletal: Normal range of motion.        General: No tenderness.  Right lower leg: No edema.     Left lower leg: No edema.  Lymphadenopathy:     Cervical: No cervical adenopathy.  Skin:    General: Skin is warm and dry.     Coloration: Skin is not pale.     Findings: No erythema or rash.     Comments: Solar lentigines diffusely   Neurological:     Mental Status: She is alert. Mental status is at baseline.     Cranial Nerves: No cranial nerve deficit.     Motor: No abnormal muscle tone.     Coordination: Coordination normal.     Gait: Gait normal.     Deep Tendon Reflexes:  Reflexes are normal and symmetric.  Psychiatric:        Mood and Affect: Mood normal.        Cognition and Memory: Cognition and memory normal.           Assessment & Plan:   Problem List Items Addressed This Visit      Other   B12 deficiency    Lab Results  Component Value Date   [id number] 10/20/2019   Doing well with oral supplementation       Vitamin D deficiency    Vitamin D level is therapeutic with current supplementation Disc importance of this to bone and overall health Level in 50s       Hyperlipidemia    Disc goals for lipids and reasons to control them Rev last labs with pt Rev low sat fat diet in detail  Well controlled with crestor and improved diet       Relevant Medications   hydrochlorothiazide (HYDRODIURIL) 25 MG tablet   rosuvastatin (CRESTOR) 10 MG tablet   Routine general medical examination at a health care facility - Primary    Reviewed health habits including diet and exercise and skin cancer prevention Reviewed appropriate screening tests for age  Also reviewed health mt list, fam hx and immunization status , as well as social and family history   See HPI Labs reviewed  pna 23 vaccine booster today (husband is immunocomp at home and she wants to stay utd) She had the shingrix vaccines Declines gyn exam (no prior problems and no symptoms)         Depression with anxiety    Pt's mood is much improved with fluoxetine and also not working currently  Reviewed stressors/ coping techniques/symptoms/ support sources/ tx options and side effects in detail today Expects stressors to increase when back to work  [employer] self care / exercise       Encounter for routine gynecological examination   Prediabetes    Lab Results  Component Value Date   HGBA1C 5.2 10/20/2019   This is stable disc imp of low glycemic diet and wt loss to prevent DM2        Other Visit Diagnoses    Need for 23-polyvalent pneumococcal polysaccharide vaccine        Relevant Orders   Pneumococcal polysaccharide vaccine 23-valent greater than or equal to 2yo subcutaneous/IM (Completed)

## 2019-10-27 NOTE — Assessment & Plan Note (Signed)
Reviewed health habits including diet and exercise and skin cancer prevention Reviewed appropriate screening tests for age  Also reviewed health mt list, fam hx and immunization status , as well as social and family history   See HPI Labs reviewed  pna 23 vaccine booster today (husband is immunocomp at home and she wants to stay utd) She had the shingrix vaccines Declines gyn exam (no prior problems and no symptoms)

## 2019-10-27 NOTE — Assessment & Plan Note (Signed)
Lab Results  Component Value Date   [id number] 10/20/2019   Doing well with oral supplementation

## 2019-10-27 NOTE — Assessment & Plan Note (Signed)
Pt's mood is much improved with fluoxetine and also not working currently  Reviewed stressors/ coping techniques/symptoms/ support sources/ tx options and side effects in detail today Expects stressors to increase when back to work  [employer] self care / exercise

## 2019-10-27 NOTE — Assessment & Plan Note (Signed)
Lab Results  Component Value Date   HGBA1C 5.2 10/20/2019   This is stable disc imp of low glycemic diet and wt loss to prevent DM2

## 2019-11-01 DIAGNOSIS — Z01812 Encounter for preprocedural laboratory examination: Secondary | ICD-10-CM | POA: Diagnosis not present

## 2019-11-01 DIAGNOSIS — Z20828 Contact with and (suspected) exposure to other viral communicable diseases: Secondary | ICD-10-CM | POA: Diagnosis not present

## 2019-11-03 DIAGNOSIS — K219 Gastro-esophageal reflux disease without esophagitis: Secondary | ICD-10-CM | POA: Diagnosis not present

## 2019-11-03 DIAGNOSIS — J3489 Other specified disorders of nose and nasal sinuses: Secondary | ICD-10-CM | POA: Diagnosis not present

## 2019-11-03 DIAGNOSIS — Z79899 Other long term (current) drug therapy: Secondary | ICD-10-CM | POA: Diagnosis not present

## 2019-11-03 DIAGNOSIS — G4733 Obstructive sleep apnea (adult) (pediatric): Secondary | ICD-10-CM | POA: Diagnosis not present

## 2019-11-03 DIAGNOSIS — F329 Major depressive disorder, single episode, unspecified: Secondary | ICD-10-CM | POA: Diagnosis not present

## 2019-11-03 DIAGNOSIS — H02831 Dermatochalasis of right upper eyelid: Secondary | ICD-10-CM | POA: Diagnosis not present

## 2019-11-03 DIAGNOSIS — R519 Headache, unspecified: Secondary | ICD-10-CM | POA: Diagnosis not present

## 2019-11-03 DIAGNOSIS — Z6833 Body mass index (BMI) 33.0-33.9, adult: Secondary | ICD-10-CM | POA: Diagnosis not present

## 2019-11-03 DIAGNOSIS — H02403 Unspecified ptosis of bilateral eyelids: Secondary | ICD-10-CM | POA: Diagnosis not present

## 2019-11-03 DIAGNOSIS — R0689 Other abnormalities of breathing: Secondary | ICD-10-CM | POA: Diagnosis not present

## 2019-11-03 DIAGNOSIS — H02834 Dermatochalasis of left upper eyelid: Secondary | ICD-10-CM | POA: Diagnosis not present

## 2019-11-03 DIAGNOSIS — E669 Obesity, unspecified: Secondary | ICD-10-CM | POA: Diagnosis not present

## 2019-11-03 DIAGNOSIS — H5340 Unspecified visual field defects: Secondary | ICD-10-CM | POA: Diagnosis not present

## 2019-11-03 DIAGNOSIS — L987 Excessive and redundant skin and subcutaneous tissue: Secondary | ICD-10-CM | POA: Diagnosis not present

## 2019-11-03 DIAGNOSIS — E785 Hyperlipidemia, unspecified: Secondary | ICD-10-CM | POA: Diagnosis not present

## 2019-11-24 DIAGNOSIS — H02881 Meibomian gland dysfunction right upper eyelid: Secondary | ICD-10-CM | POA: Diagnosis not present

## 2020-02-11 DIAGNOSIS — K219 Gastro-esophageal reflux disease without esophagitis: Secondary | ICD-10-CM | POA: Diagnosis not present

## 2020-02-26 ENCOUNTER — Ambulatory Visit: Payer: BC Managed Care – PPO | Attending: Internal Medicine

## 2020-02-26 ENCOUNTER — Ambulatory Visit: Payer: BC Managed Care – PPO

## 2020-02-26 DIAGNOSIS — Z23 Encounter for immunization: Secondary | ICD-10-CM

## 2020-02-26 NOTE — Progress Notes (Signed)
   Covid-19 Vaccination Clinic  Name:  Pamela Anthony    MRN: [id number] DOB: Jun 14, 1956  02/26/2020  [name] was observed post Covid-19 immunization for 15 minutes without incident. She was provided with Vaccine Information Sheet and instruction to access the V-Safe system.   [name] was instructed to call 911 with any severe reactions post vaccine: Marland Kitchen Difficulty breathing  . Swelling of face and throat  . A fast heartbeat  . A bad rash all over body  . Dizziness and weakness   Immunizations Administered    Name Date Dose VIS Date Route   Pfizer COVID-19 Vaccine 02/26/2020 10:12 AM 0.3 mL 11/19/2019 Intramuscular   Manufacturer: [employer], [employer]   Lot: [id number]   [place]: [id number]

## 2020-03-22 ENCOUNTER — Ambulatory Visit: Payer: BC Managed Care – PPO | Attending: Internal Medicine

## 2020-03-22 DIAGNOSIS — Z23 Encounter for immunization: Secondary | ICD-10-CM

## 2020-03-22 NOTE — Progress Notes (Signed)
   Covid-19 Vaccination Clinic  Name:  Pamela Anthony    MRN: [id number] DOB: February 06, 1956  03/22/2020  Pamela Anthony was observed post Covid-19 immunization for 15 minutes without incident. She was provided with Vaccine Information Sheet and instruction to access the V-Safe system.   Pamela Anthony was instructed to call 911 with any severe reactions post vaccine: Marland Kitchen Difficulty breathing  . Swelling of face and throat  . A fast heartbeat  . A bad rash all over body  . Dizziness and weakness   Immunizations Administered    Name Date Dose VIS Date Route   Pfizer COVID-19 Vaccine 03/22/2020 11:38 AM 0.3 mL 11/19/2019 Intramuscular   Manufacturer: [place]   Lot: [id number]   [place]: [id number]

## 2020-04-26 ENCOUNTER — Encounter: Payer: Self-pay | Admitting: Family Medicine

## 2020-04-28 ENCOUNTER — Encounter: Payer: Self-pay | Admitting: Family Medicine

## 2020-04-28 ENCOUNTER — Ambulatory Visit: Payer: BC Managed Care – PPO | Admitting: Family Medicine

## 2020-04-28 ENCOUNTER — Other Ambulatory Visit: Payer: Self-pay

## 2020-04-28 VITALS — BP 124/78 | HR 69 | Temp 97.0°F | Ht 66.25 in | Wt 211.1 lb

## 2020-04-28 DIAGNOSIS — B372 Candidiasis of skin and nail: Secondary | ICD-10-CM | POA: Diagnosis not present

## 2020-04-28 DIAGNOSIS — L304 Erythema intertrigo: Secondary | ICD-10-CM | POA: Insufficient documentation

## 2020-04-28 DIAGNOSIS — G43709 Chronic migraine without aura, not intractable, without status migrainosus: Secondary | ICD-10-CM | POA: Diagnosis not present

## 2020-04-28 DIAGNOSIS — IMO0002 Reserved for concepts with insufficient information to code with codable children: Secondary | ICD-10-CM

## 2020-04-28 MED ORDER — TOPIRAMATE 50 MG PO TABS: 50.0000 mg | ORAL_TABLET | Freq: Every day | ORAL | 3 refills | Status: DC

## 2020-04-28 MED ORDER — KETOCONAZOLE 2 % EX CREA: 1.0000 "application " | TOPICAL_CREAM | Freq: Every day | CUTANEOUS | 1 refills | Status: DC

## 2020-04-28 NOTE — Assessment & Plan Note (Signed)
Under pannus  Symptoms do re occur (currently using clotrimazole with betamethasone)  Disc imp of staying dry  Ketoconazole cream 2% px to use daily  Update if not starting to improve in a week or if worsening   Antifungal powder may be useful in between flares

## 2020-04-28 NOTE — Progress Notes (Signed)
Subjective:    Patient ID: Pamela Anthony, female    DOB: 1956-05-06, 64 y.o.   MRN: [id number]  This visit occurred during the SARS-CoV-2 public health emergency.  Safety protocols were in place, including screening questions prior to the visit, additional usage of staff PPE, and extensive cleaning of exam room while observing appropriate contact time as indicated for disinfecting solutions.    HPI Pt presents with c/o rash under abdomen  Wt Readings from Last 3 Encounters:  04/28/20 211 lb 1 oz (95.7 kg)  10/27/19 211 lb 9 oz (96 kg)  01/15/19 218 lb (98.9 kg)   33.81 kg/m   Rash under abd comes and goes  Antifungal cream helps itch   (clotrimazole and betamethasone)  Is rough to the touch now  Itchy   Very hard to keep this area dry  Uses cotton underwear  occ powder over the counter   Worse in the summer    Also wanted to discuss refilling topamax  Does not go to the headache doctor any more ([facility])   Takes 50 mg at bedtime  This works fairly well   Headaches are less frequent  She does tend to be worse in the spring due to allergens  Change in sleep habits -also a trigger  Also driving too much  She does drink lots of fluids    Has sumatriptan for rescue (nasal spray)   She had stopped topamax for a while-then headaches returned   Patient Active Problem List   Diagnosis Date Noted  . Candidal intertrigo 04/28/2020  . Colon cancer screening 03/18/2018  . Fatigue 03/18/2018  . Palpitations 06/20/2015  . Globus sensation 06/20/2015  . Thoracic back pain 06/20/2015  . Prediabetes 03/16/2015  . Somnolence 01/16/2015  . Stress reaction 01/16/2015  . Neck pain 07/04/2014  . Encounter for routine gynecological examination 09/01/2013  . Depression with anxiety 01/18/2013  . Post-menopausal 08/14/2012  . Other screening mammogram 04/29/2011  . Routine general medical examination at a health care facility 04/22/2011  . ARTHRALGIA  02/28/2010  . Vitamin D deficiency 03/08/2009  . IRRITABLE BOWEL SYNDROME 06/27/2008  . B12 deficiency 02/23/2008  . POLYP, COLON 03/06/2007  . Hyperlipidemia 03/06/2007  . ALLERGIC RHINITIS 03/06/2007  . GERD 03/06/2007  . HIATAL HERNIA 03/06/2007  . LOW BACK PAIN 03/06/2007  . OSA (obstructive sleep apnea) 03/06/2007  . URINARY INCONTINENCE 03/06/2007  . Chronic migraine 03/06/2007   Past Medical History:  Diagnosis Date  . Allergic rhinitis   . Allergy   . Anxiety   . B12 deficiency   . Degenerative disc disease    back  . Fungal infection 2005   lamasil   . GERD (gastroesophageal reflux disease)    EGD 10/2004  . Hemorrhoids, internal    colonoscopy 02/2008  . Hyperlipemia   . Iron deficiency anemia   . Low back pain   . Obesity   . Sleep apnea    wears cpap  . Urinary incontinence   . Vitamin D deficiency    mild   Past Surgical History:  Procedure Laterality Date  . CESAREAN SECTION  1985  . COLONOSCOPY    . DILATION AND CURETTAGE OF UTERUS  2005  . HAND SURGERY  2000   nodule removed  . KNEE SURGERY  1970's   right  . NASAL SINUS SURGERY  2000's   right maxillary  . TONSILLECTOMY AND ADENOIDECTOMY     Social History   Tobacco Use  .  Smoking status: Never Smoker  . Smokeless tobacco: Never Used  Substance Use Topics  . Alcohol use: Yes    Alcohol/week: 0.0 standard drinks    Comment: occasional  . Drug use: No   Family History  Problem Relation Age of Onset  . Coronary artery disease Father        in his 36's  . Arthritis Father   . Stroke Father   . Heart disease Father        pacemaker  . Prostate cancer Father   . Breast cancer Maternal Grandmother   . Colon cancer Other   . Colon polyps Neg Hx    Allergies  Allergen Reactions  . Atorvastatin     REACTION: myalgias  . Oxybutynin     REACTION: rash with  patch    Current Outpatient Medications on File Prior to Visit  Medication Sig Dispense Refill  . Calcium Carbonate-Vitamin  D (CALTRATE 600+D) 600-400 MG-UNIT per tablet Take 1 tablet by mouth daily.      . Cholecalciferol (VITAMIN D-3 PO) Take 1 tablet by mouth daily.    . Cyanocobalamin (B-12 PO) Take by mouth daily.    . cyclobenzaprine (FLEXERIL) 10 MG tablet Take 0.5 tablets (5 mg total) by mouth 3 (three) times daily as needed. For muscle spasm and pain 30 tablet 5  . FLUoxetine (PROZAC) 20 MG capsule Take 3 capsules (60 mg total) by mouth daily. 270 capsule 3  . hydrochlorothiazide (HYDRODIURIL) 25 MG tablet TAKE 1/2 TO 1 (ONE-HALF TO ONE) TABLET BY MOUTH ONCE DAILY AS NEEDED 90 tablet 3  . mometasone (ELOCON) 0.1 % cream   0  . omeprazole (PRILOSEC) 20 MG capsule Take 1 capsule (20 mg total) by mouth daily. 90 capsule 3  . oxybutynin (DITROPAN-XL) 10 MG 24 hr tablet Take 1 tablet (10 mg total) by mouth at bedtime. 90 tablet 3  . rosuvastatin (CRESTOR) 10 MG tablet Take 1 tablet (10 mg total) by mouth daily. 90 tablet 3  . SUMAtriptan (IMITREX) 20 MG/ACT nasal spray Place 20 mg into the nose every 2 (two) hours as needed for migraine. May repeat in 2 hours if headache persists or recurs.     No current facility-administered medications on file prior to visit.    Review of Systems  Constitutional: Negative for activity change, appetite change, fatigue, fever and unexpected weight change.  HENT: Negative for congestion, ear pain, rhinorrhea, sinus pressure and sore throat.   Eyes: Negative for pain, redness and visual disturbance.  Respiratory: Negative for cough, shortness of breath and wheezing.   Cardiovascular: Negative for chest pain and palpitations.  Gastrointestinal: Negative for abdominal pain, blood in stool, constipation and diarrhea.  Endocrine: Negative for polydipsia and polyuria.  Genitourinary: Negative for dysuria, frequency and urgency.  Musculoskeletal: Negative for arthralgias, back pain and myalgias.  Skin: Positive for rash. Negative for pallor.  Allergic/Immunologic: Negative for  environmental allergies.  Neurological: Positive for headaches. Negative for dizziness and syncope.  Hematological: Negative for adenopathy. Does not bruise/bleed easily.  Psychiatric/Behavioral: Negative for decreased concentration and dysphoric mood. The patient is not nervous/anxious.        Objective:   Physical Exam Constitutional:      General: She is not in acute distress.    Appearance: Normal appearance. She is obese. She is not ill-appearing.  Eyes:     General: No scleral icterus.    Conjunctiva/sclera: Conjunctivae normal.     Pupils: Pupils are equal, round, and reactive to  light.  Cardiovascular:     Rate and Rhythm: Normal rate and regular rhythm.  Pulmonary:     Effort: Pulmonary effort is normal. No respiratory distress.     Breath sounds: Normal breath sounds. No wheezing.  Skin:    General: Skin is warm and dry.     Coloration: Skin is not jaundiced.     Findings: Rash present.     Comments: 2 cm oval area of erythema w/o central clearing-under pannus on the R  (slight scale) No excoriations  Scant pink color in skin bilat under pannus (no raised areas)     Neurological:     Mental Status: She is alert.     Cranial Nerves: No cranial nerve deficit.     Coordination: Coordination normal.     Deep Tendon Reflexes: Reflexes normal.  Psychiatric:        Mood and Affect: Mood normal.           Assessment & Plan:   Problem List Items Addressed This Visit      Cardiovascular and Mediastinum   Chronic migraine    Doing well- but was unable to come off topamax entirely  Back on just 50 mg at night with good control and minimal side effects  Enc good health and sleep habits Discussed triggers  Refilled med  Uses imitrex prn  Not returning [facility] the [facility] at this point       Relevant Medications   topiramate (TOPAMAX) 50 MG tablet     Musculoskeletal and Integument   Candidal intertrigo - Primary    Under pannus  Symptoms do re  occur (currently using clotrimazole with betamethasone)  Disc imp of staying dry  Ketoconazole cream 2% px to use daily  Update if not starting to improve in a week or if worsening   Antifungal powder may be useful in between flares       Relevant Medications   ketoconazole (NIZORAL) 2 % cream

## 2020-04-28 NOTE — Assessment & Plan Note (Signed)
Doing well- but was unable to come off topamax entirely  Back on just 50 mg at night with good control and minimal side effects  Enc good health and sleep habits Discussed triggers  Refilled med  Uses imitrex prn  Not returning to the [facility] at this point

## 2020-04-28 NOTE — Patient Instructions (Addendum)
Keep rash area as dry as possible  Try the ketoconazole cream daily  In between flares- any antifungal powder over the counter may also help   I refilled topamax 50 mg at bedtime  Control your triggers the can   Keep [place] posted

## 2020-08-17 ENCOUNTER — Other Ambulatory Visit: Payer: Self-pay | Admitting: Family Medicine

## 2020-08-31 ENCOUNTER — Other Ambulatory Visit: Payer: Self-pay

## 2020-08-31 ENCOUNTER — Ambulatory Visit (INDEPENDENT_AMBULATORY_CARE_PROVIDER_SITE_OTHER): Payer: BC Managed Care – PPO

## 2020-08-31 DIAGNOSIS — Z23 Encounter for immunization: Secondary | ICD-10-CM | POA: Diagnosis not present

## 2020-09-14 ENCOUNTER — Other Ambulatory Visit: Payer: Self-pay

## 2020-09-14 ENCOUNTER — Encounter: Payer: Self-pay | Admitting: Family Medicine

## 2020-09-14 ENCOUNTER — Ambulatory Visit: Payer: BC Managed Care – PPO | Admitting: Family Medicine

## 2020-09-14 VITALS — BP 114/70 | HR 78 | Temp 96.5°F | Ht 66.25 in | Wt 212.2 lb

## 2020-09-14 DIAGNOSIS — M25542 Pain in joints of left hand: Secondary | ICD-10-CM | POA: Diagnosis not present

## 2020-09-14 DIAGNOSIS — R5382 Chronic fatigue, unspecified: Secondary | ICD-10-CM | POA: Diagnosis not present

## 2020-09-14 DIAGNOSIS — M791 Myalgia, unspecified site: Secondary | ICD-10-CM | POA: Diagnosis not present

## 2020-09-14 DIAGNOSIS — M25541 Pain in joints of right hand: Secondary | ICD-10-CM | POA: Diagnosis not present

## 2020-09-14 LAB — SEDIMENTATION RATE: Sed Rate: 23 mm/hr (ref 0–30)

## 2020-09-14 LAB — CBC WITH DIFFERENTIAL/PLATELET
Basophils Absolute: 0.1 10*3/uL (ref 0.0–0.1)
Basophils Relative: 0.9 % (ref 0.0–3.0)
Eosinophils Absolute: 0.1 10*3/uL (ref 0.0–0.7)
Eosinophils Relative: 2.5 % (ref 0.0–5.0)
HCT: 37.8 % (ref 36.0–46.0)
Hemoglobin: 12.8 g/dL (ref 12.0–15.0)
Lymphocytes Relative: 18.9 % (ref 12.0–46.0)
Lymphs Abs: 1.1 10*3/uL (ref 0.7–4.0)
MCHC: 33.9 g/dL (ref 30.0–36.0)
MCV: 87 fl (ref 78.0–100.0)
Monocytes Absolute: 0.4 10*3/uL (ref 0.1–1.0)
Monocytes Relative: 6.2 % (ref 3.0–12.0)
Neutro Abs: 4.2 10*3/uL (ref 1.4–7.7)
Neutrophils Relative %: 71.5 % (ref 43.0–77.0)
Platelets: 246 10*3/uL (ref 150.0–400.0)
RBC: 4.34 Mil/uL (ref 3.87–5.11)
RDW: 13.7 % (ref 11.5–15.5)
WBC: 5.9 10*3/uL (ref 4.0–10.5)

## 2020-09-14 LAB — CK: Total CK: 42 U/L (ref 7–177)

## 2020-09-14 LAB — BASIC METABOLIC PANEL
BUN: 13 mg/dL (ref 6–23)
CO2: 30 mEq/L (ref 19–32)
Calcium: 9.2 mg/dL (ref 8.4–10.5)
Chloride: 102 mEq/L (ref 96–112)
Creatinine, Ser: 0.72 mg/dL (ref 0.40–1.20)
GFR: 88.78 mL/min (ref >60.00)
Glucose, Bld: 86 mg/dL (ref 70–99)
Potassium: 4 mEq/L (ref 3.5–5.1)
Sodium: 140 mEq/L (ref 135–145)

## 2020-09-14 NOTE — Assessment & Plan Note (Signed)
Along with muscle and joint pain  Denies depression or excessive stress Pending labs

## 2020-09-14 NOTE — Progress Notes (Signed)
Subjective:    Patient ID: Pamela Anthony, female    DOB: [birth year], 64 y.o.   MRN: [id number]  This visit occurred during the SARS-CoV-2 public health emergency.  Safety protocols were in place, including screening questions prior to the visit, additional usage of staff PPE, and extensive cleaning of exam room while observing appropriate contact time as indicated for disinfecting solutions.    HPI Pt presents with muscle pain and fatigue  Wt Readings from Last 3 Encounters:  09/14/20 212 lb 3 oz (96.2 kg)  04/28/20 211 lb 1 oz (95.7 kg)  10/27/19 211 lb 9 oz (96 kg)   33.99 kg/m  Symptoms started 1-2 mo ago  Joint pain - hands, elbow, shoulder , knees (has oa there)  No joint redness or swelling  Some stiffness  occ tenderness   Hands are the worst  No new rash   Muscle aches - upper arms and between shoulder blades  Occ upper and lower legs   So drained/tired --much more than usual  Lab Results  Component Value Date   VITAMINB12 681 10/20/2019  eating a balanced diet and taking supplements   Stress level has come down some   Exercise- a little (too tired)  When she does- walking    Some post nasal drip   On crestor - no problems in the past   Denies depression symptoms   Patient Active Problem List   Diagnosis Date Noted   Candidal intertrigo 04/28/2020   Colon cancer screening 03/18/2018   Fatigue 03/18/2018   Palpitations 06/20/2015   Globus sensation 06/20/2015   Thoracic back pain 06/20/2015   Prediabetes 03/16/2015   Somnolence 01/16/2015   Stress reaction 01/16/2015   Neck pain 07/04/2014   Encounter for routine gynecological examination 09/01/2013   Depression with anxiety 01/18/2013   Post-menopausal 08/14/2012   Other screening mammogram 04/29/2011   Routine general medical examination at a health care facility 04/22/2011   Joint pain 02/28/2010   Muscle pain 02/28/2010   Vitamin D deficiency 03/08/2009   IRRITABLE  BOWEL SYNDROME 06/27/2008   B12 deficiency 02/23/2008   POLYP, COLON 03/06/2007   Hyperlipidemia 03/06/2007   ALLERGIC RHINITIS 03/06/2007   GERD 03/06/2007   HIATAL HERNIA 03/06/2007   LOW BACK PAIN 03/06/2007   OSA (obstructive sleep apnea) 03/06/2007   URINARY INCONTINENCE 03/06/2007   Chronic migraine 03/06/2007   Past Medical History:  Diagnosis Date   Allergic rhinitis    Allergy    Anxiety    B12 deficiency    Degenerative disc disease    back   Fungal infection 2005   lamasil    GERD (gastroesophageal reflux disease)    EGD 10/2004   Hemorrhoids, internal    colonoscopy 02/2008   Hyperlipemia    Iron deficiency anemia    Low back pain    Obesity    Sleep apnea    wears cpap   Urinary incontinence    Vitamin D deficiency    mild   Past Surgical History:  Procedure Laterality Date   [place]  2005   HAND SURGERY  2000   nodule removed   KNEE SURGERY  1970's   right   NASAL SINUS SURGERY  2000's   right maxillary   TONSILLECTOMY AND ADENOIDECTOMY     Social History   Tobacco Use   Smoking status: Never Smoker   Smokeless tobacco: Never Used  Vaping Use   Vaping Use: Never used  Substance Use Topics   Alcohol use: Yes    Alcohol/week: 0.0 standard drinks    Comment: occasional   Drug use: No   Family History  Problem Relation Age of Onset   Coronary artery disease Father        in his 48's   Arthritis Father    Stroke Father    Heart disease Father        pacemaker   Prostate cancer Father    Breast cancer Maternal Grandmother    Colon cancer Other    Colon polyps Neg Hx    Allergies  Allergen Reactions   Atorvastatin     REACTION: myalgias   Oxybutynin     REACTION: rash with  patch    Current Outpatient Medications on File Prior to Visit  Medication Sig Dispense Refill   Cholecalciferol (VITAMIN D-3 PO) Take 1 tablet  by mouth daily.     Cyanocobalamin (B-12 PO) Take by mouth daily.     FLUoxetine (PROZAC) 20 MG capsule TAKE 3 CAPSULES BY MOUTH ONCE DAILY 270 capsule 0   hydrochlorothiazide (HYDRODIURIL) 25 MG tablet TAKE 1/2 TO 1 (ONE-HALF TO ONE) TABLET BY MOUTH ONCE DAILY AS NEEDED 90 tablet 3   ketoconazole (NIZORAL) 2 % cream Apply 1 application topically daily. 30 g 1   omeprazole (PRILOSEC) 20 MG capsule Take 1 capsule (20 mg total) by mouth daily. 90 capsule 3   oxybutynin (DITROPAN-XL) 10 MG 24 hr tablet Take 1 tablet (10 mg total) by mouth at bedtime. 90 tablet 3   rosuvastatin (CRESTOR) 10 MG tablet Take 1 tablet (10 mg total) by mouth daily. 90 tablet 3   SUMAtriptan (IMITREX) 20 MG/ACT nasal spray Place 20 mg into the nose every 2 (two) hours as needed for migraine. May repeat in 2 hours if headache persists or recurs.     topiramate (TOPAMAX) 50 MG tablet Take 1 tablet (50 mg total) by mouth at bedtime. 90 tablet 3   Calcium Carbonate-Vitamin D (CALTRATE 600+D) 600-400 MG-UNIT per tablet Take 1 tablet by mouth daily.   (Patient not taking: Reported on 09/14/2020)     cyclobenzaprine (FLEXERIL) 10 MG tablet Take 0.5 tablets (5 mg total) by mouth 3 (three) times daily as needed. For muscle spasm and pain (Patient not taking: Reported on 09/14/2020) 30 tablet 5   mometasone (ELOCON) 0.1 % cream  (Patient not taking: Reported on 09/14/2020)  0   No current facility-administered medications on file prior to visit.    Review of Systems  Constitutional: Negative for activity change, appetite change, fatigue, fever and unexpected weight change.  HENT: Negative for congestion, ear pain, rhinorrhea, sinus pressure and sore throat.   Eyes: Negative for pain, redness and visual disturbance.  Respiratory: Negative for cough, shortness of breath and wheezing.   Cardiovascular: Negative for chest pain and palpitations.  Gastrointestinal: Negative for abdominal pain, blood in stool, constipation and  diarrhea.  Endocrine: Negative for polydipsia and polyuria.  Genitourinary: Negative for dysuria, frequency and urgency.  Musculoskeletal: Positive for arthralgias, back pain, myalgias and neck pain. Negative for gait problem and joint swelling.       Neck pain has been for a long time   Skin: Negative for pallor and rash.  Allergic/Immunologic: Negative for environmental allergies.  Neurological: Negative for dizziness, syncope and headaches.  Hematological: Negative for adenopathy. Does not bruise/bleed easily.  Psychiatric/Behavioral: Negative for decreased concentration and dysphoric mood. The  patient is not nervous/anxious.        Objective:   Physical Exam Constitutional:      General: She is not in acute distress.    Appearance: Normal appearance. She is well-developed. She is obese. She is not ill-appearing.  HENT:     Head: Normocephalic and atraumatic.  Eyes:     General: No scleral icterus.    Conjunctiva/sclera: Conjunctivae normal.     Pupils: Pupils are equal, round, and reactive to light.  Neck:     Thyroid: No thyromegaly.     Vascular: No carotid bruit or JVD.  Cardiovascular:     Rate and Rhythm: Normal rate and regular rhythm.     Heart sounds: Normal heart sounds. No gallop.   Pulmonary:     Effort: Pulmonary effort is normal. No respiratory distress.     Breath sounds: Normal breath sounds. No wheezing or rales.  Abdominal:     General: There is no distension or abdominal bruit.  Musculoskeletal:     Cervical back: Normal range of motion and neck supple. No rigidity or tenderness.     Comments: Tender over trapezius and thoracic musculature as well as upper arms No spinal tenderness Full rom of CS and TS Nl gait   No joint swelling or tenderness   Lymphadenopathy:     Cervical: No cervical adenopathy.  Skin:    General: Skin is warm and dry.     Findings: No rash.  Neurological:     Mental Status: She is alert.     Sensory: No sensory deficit.       Coordination: Coordination normal.     Deep Tendon Reflexes: Reflexes are normal and symmetric. Reflexes normal.  Psychiatric:        Mood and Affect: Mood normal.           Assessment & Plan:   Problem List Items Addressed This Visit      Other   Joint pain    Along with myofacial pain  No joint swelling or redness or tenderness and no deformity on exam Auto immune labs ordered  Disc poss of PMR or statin intolerance or other  If not ? Possible fibromyalgia        Relevant Orders   ANA   Rheumatoid factor   Sedimentation Rate (Completed)   CK (Completed)   Muscle pain - Primary    Pt has developed significant myofasical pain, especially in the shoulder girdle  Also joint stiffness  Labs done today for autoimmune process  Disc poss of PMR -sed rate pending or statin intolerance  Pending results  nsaid prn  Adv to stretch/keep moving      Relevant Orders   ANA   Rheumatoid factor   Sedimentation Rate (Completed)   CK (Completed)   Fatigue    Along with muscle and joint pain  Denies depression or excessive stress Pending labs       Relevant Orders   Basic metabolic panel (Completed)   CBC with Differential/Platelet (Completed)

## 2020-09-14 NOTE — Patient Instructions (Addendum)
Labs today   claritin or zyrtec or allegra for post nasal drip    Keep moving

## 2020-09-14 NOTE — Assessment & Plan Note (Signed)
Pt has developed significant myofasical pain, especially in the shoulder girdle  Also joint stiffness  Labs done today for autoimmune process  Disc poss of PMR -sed rate pending or statin intolerance  Pending results  nsaid prn  Adv to stretch/keep moving

## 2020-09-14 NOTE — Assessment & Plan Note (Signed)
Along with myofacial pain  No joint swelling or redness or tenderness and no deformity on exam Auto immune labs ordered  Disc poss of PMR or statin intolerance or other  If not ? Possible fibromyalgia

## 2020-09-18 ENCOUNTER — Other Ambulatory Visit: Payer: Self-pay | Admitting: Family Medicine

## 2020-09-18 DIAGNOSIS — Z1231 Encounter for screening mammogram for malignant neoplasm of breast: Secondary | ICD-10-CM

## 2020-09-19 LAB — ANTI-NUCLEAR AB-TITER (ANA TITER)
ANA TITER: 1:320 {titer} — ABNORMAL HIGH
ANA TITER: 1:40 {titer} — ABNORMAL HIGH
ANA Titer 1: 1:80 {titer} — ABNORMAL HIGH

## 2020-09-19 LAB — RHEUMATOID FACTOR: Rheumatoid fact SerPl-aCnc: <14 IU/mL (ref <14)

## 2020-09-19 LAB — ANA: Anti Nuclear Antibody (ANA): POSITIVE — AB

## 2020-09-20 ENCOUNTER — Telehealth: Payer: Self-pay | Admitting: Family Medicine

## 2020-09-20 DIAGNOSIS — R768 Other specified abnormal immunological findings in serum: Secondary | ICD-10-CM | POA: Insufficient documentation

## 2020-09-20 DIAGNOSIS — M25542 Pain in joints of left hand: Secondary | ICD-10-CM

## 2020-09-20 DIAGNOSIS — M25541 Pain in joints of right hand: Secondary | ICD-10-CM

## 2020-09-20 NOTE — Telephone Encounter (Signed)
-----   Message from Pamela Anthony, [place] sent at 09/20/2020 12:18 PM EDT ----- Pt notified of lab results and ANA being positive. Pt agrees with rheum referral she does not have a preference she said either city is fine. I advised pt Dr. Glori Bickers will place the referral and our [facility] will call to schedule appt

## 2020-09-26 DIAGNOSIS — M2550 Pain in unspecified joint: Secondary | ICD-10-CM | POA: Diagnosis not present

## 2020-09-26 DIAGNOSIS — H04123 Dry eye syndrome of bilateral lacrimal glands: Secondary | ICD-10-CM | POA: Diagnosis not present

## 2020-09-26 DIAGNOSIS — R768 Other specified abnormal immunological findings in serum: Secondary | ICD-10-CM | POA: Diagnosis not present

## 2020-09-26 DIAGNOSIS — M791 Myalgia, unspecified site: Secondary | ICD-10-CM | POA: Diagnosis not present

## 2020-10-02 ENCOUNTER — Ambulatory Visit
Admission: RE | Admit: 2020-10-02 | Discharge: 2020-10-02 | Disposition: A | Discharge status: Home / Self Care | Payer: BC Managed Care – PPO | Source: Ambulatory Visit | Attending: Family Medicine | Admitting: Family Medicine

## 2020-10-02 ENCOUNTER — Other Ambulatory Visit: Payer: Self-pay

## 2020-10-02 DIAGNOSIS — Z1231 Encounter for screening mammogram for malignant neoplasm of breast: Secondary | ICD-10-CM | POA: Insufficient documentation

## 2020-10-17 DIAGNOSIS — R7689 Other specified abnormal immunological findings in serum: Secondary | ICD-10-CM | POA: Insufficient documentation

## 2020-10-17 DIAGNOSIS — M791 Myalgia, unspecified site: Secondary | ICD-10-CM | POA: Diagnosis not present

## 2020-10-17 DIAGNOSIS — R768 Other specified abnormal immunological findings in serum: Secondary | ICD-10-CM | POA: Diagnosis not present

## 2020-10-21 ENCOUNTER — Telehealth: Payer: Self-pay | Admitting: Family Medicine

## 2020-10-21 DIAGNOSIS — E538 Deficiency of other specified B group vitamins: Secondary | ICD-10-CM

## 2020-10-21 DIAGNOSIS — Z0000 Encounter for general adult medical examination without abnormal findings: Secondary | ICD-10-CM

## 2020-10-21 DIAGNOSIS — R7303 Prediabetes: Secondary | ICD-10-CM

## 2020-10-21 DIAGNOSIS — E7800 Pure hypercholesterolemia, unspecified: Secondary | ICD-10-CM

## 2020-10-21 DIAGNOSIS — E559 Vitamin D deficiency, unspecified: Secondary | ICD-10-CM

## 2020-10-21 NOTE — Telephone Encounter (Signed)
-----   Message from Ellamae Sia sent at 10/12/2020  2:31 PM EDT ----- Regarding: Lab orders for Monday, 11.15.21 Patient is scheduled for CPX labs, please order future labs, Thanks , Karna Christmas

## 2020-10-23 ENCOUNTER — Other Ambulatory Visit (INDEPENDENT_AMBULATORY_CARE_PROVIDER_SITE_OTHER): Payer: BC Managed Care – PPO

## 2020-10-23 ENCOUNTER — Other Ambulatory Visit: Payer: Self-pay

## 2020-10-23 DIAGNOSIS — E559 Vitamin D deficiency, unspecified: Secondary | ICD-10-CM

## 2020-10-23 DIAGNOSIS — Z0000 Encounter for general adult medical examination without abnormal findings: Secondary | ICD-10-CM | POA: Diagnosis not present

## 2020-10-23 DIAGNOSIS — R7303 Prediabetes: Secondary | ICD-10-CM | POA: Diagnosis not present

## 2020-10-23 DIAGNOSIS — E538 Deficiency of other specified B group vitamins: Secondary | ICD-10-CM | POA: Diagnosis not present

## 2020-10-23 DIAGNOSIS — E7800 Pure hypercholesterolemia, unspecified: Secondary | ICD-10-CM | POA: Diagnosis not present

## 2020-10-23 LAB — COMPREHENSIVE METABOLIC PANEL
ALT: 31 U/L (ref 0–35)
AST: 27 U/L (ref 0–37)
Albumin: 4.2 g/dL (ref 3.5–5.2)
Alkaline Phosphatase: 74 U/L (ref 39–117)
BUN: 17 mg/dL (ref 6–23)
CO2: 30 mEq/L (ref 19–32)
Calcium: 8.9 mg/dL (ref 8.4–10.5)
Chloride: 103 mEq/L (ref 96–112)
Creatinine, Ser: 0.77 mg/dL (ref 0.40–1.20)
GFR: 81.96 mL/min (ref >60.00)
Glucose, Bld: 96 mg/dL (ref 70–99)
Potassium: 3.4 mEq/L — ABNORMAL LOW (ref 3.5–5.1)
Sodium: 142 mEq/L (ref 135–145)
Total Bilirubin: 0.5 mg/dL (ref 0.2–1.2)
Total Protein: 6.7 g/dL (ref 6.0–8.3)

## 2020-10-23 LAB — VITAMIN B12: Vitamin B-12: 1125 pg/mL — ABNORMAL HIGH (ref 211–911)

## 2020-10-23 LAB — CBC WITH DIFFERENTIAL/PLATELET
Basophils Absolute: 0.1 10*3/uL (ref 0.0–0.1)
Basophils Relative: 1.3 % (ref 0.0–3.0)
Eosinophils Absolute: 0.1 10*3/uL (ref 0.0–0.7)
Eosinophils Relative: 3 % (ref 0.0–5.0)
HCT: 38.7 % (ref 36.0–46.0)
Hemoglobin: 13 g/dL (ref 12.0–15.0)
Lymphocytes Relative: 22.4 % (ref 12.0–46.0)
Lymphs Abs: 1 10*3/uL (ref 0.7–4.0)
MCHC: 33.5 g/dL (ref 30.0–36.0)
MCV: 87 fl (ref 78.0–100.0)
Monocytes Absolute: 0.3 10*3/uL (ref 0.1–1.0)
Monocytes Relative: 7.7 % (ref 3.0–12.0)
Neutro Abs: 2.9 10*3/uL (ref 1.4–7.7)
Neutrophils Relative %: 65.6 % (ref 43.0–77.0)
Platelets: 234 10*3/uL (ref 150.0–400.0)
RBC: 4.44 Mil/uL (ref 3.87–5.11)
RDW: 13.5 % (ref 11.5–15.5)
WBC: 4.3 10*3/uL (ref 4.0–10.5)

## 2020-10-23 LAB — TSH: TSH: 2.28 u[IU]/mL (ref 0.35–4.50)

## 2020-10-23 LAB — LIPID PANEL
Cholesterol: 223 mg/dL — ABNORMAL HIGH (ref 0–200)
HDL: 43.5 mg/dL (ref >39.00)
LDL Cholesterol: 151 mg/dL — ABNORMAL HIGH (ref 0–99)
NonHDL: 179.52
Total CHOL/HDL Ratio: 5
Triglycerides: 142 mg/dL (ref 0.0–149.0)
VLDL: 28.4 mg/dL (ref 0.0–40.0)

## 2020-10-23 LAB — HEMOGLOBIN A1C: Hgb A1c MFr Bld: 5.7 % (ref 4.6–6.5)

## 2020-10-23 LAB — VITAMIN D 25 HYDROXY (VIT D DEFICIENCY, FRACTURES): VITD: 42.65 ng/mL (ref 30.00–100.00)

## 2020-10-30 ENCOUNTER — Encounter: Payer: Self-pay | Admitting: Family Medicine

## 2020-10-30 ENCOUNTER — Ambulatory Visit (INDEPENDENT_AMBULATORY_CARE_PROVIDER_SITE_OTHER): Payer: BC Managed Care – PPO | Admitting: Family Medicine

## 2020-10-30 ENCOUNTER — Other Ambulatory Visit (HOSPITAL_COMMUNITY)
Admission: RE | Admit: 2020-10-30 | Discharge: 2020-10-30 | Disposition: A | Discharge status: Home / Self Care | Payer: BC Managed Care – PPO | Source: Ambulatory Visit | Attending: Family Medicine | Admitting: Family Medicine

## 2020-10-30 ENCOUNTER — Other Ambulatory Visit: Payer: Self-pay

## 2020-10-30 VITALS — BP 116/68 | HR 78 | Temp 96.9°F | Ht 66.5 in | Wt 215.3 lb

## 2020-10-30 DIAGNOSIS — Z01419 Encounter for gynecological examination (general) (routine) without abnormal findings: Secondary | ICD-10-CM | POA: Insufficient documentation

## 2020-10-30 DIAGNOSIS — F418 Other specified anxiety disorders: Secondary | ICD-10-CM | POA: Diagnosis not present

## 2020-10-30 DIAGNOSIS — M7918 Myalgia, other site: Secondary | ICD-10-CM

## 2020-10-30 DIAGNOSIS — G43709 Chronic migraine without aura, not intractable, without status migrainosus: Secondary | ICD-10-CM

## 2020-10-30 DIAGNOSIS — R7303 Prediabetes: Secondary | ICD-10-CM

## 2020-10-30 DIAGNOSIS — Z0000 Encounter for general adult medical examination without abnormal findings: Secondary | ICD-10-CM

## 2020-10-30 DIAGNOSIS — E538 Deficiency of other specified B group vitamins: Secondary | ICD-10-CM

## 2020-10-30 DIAGNOSIS — E7800 Pure hypercholesterolemia, unspecified: Secondary | ICD-10-CM

## 2020-10-30 MED ORDER — OXYBUTYNIN CHLORIDE ER 10 MG PO TB24
10.0000 mg | ORAL_TABLET | Freq: Every day | ORAL | 3 refills | Status: DC
Start: 2020-10-30 — End: 2021-10-31

## 2020-10-30 MED ORDER — OMEPRAZOLE 20 MG PO CPDR
20.0000 mg | DELAYED_RELEASE_CAPSULE | Freq: Every day | ORAL | 3 refills | Status: DC
Start: 2020-10-30 — End: 2021-10-31

## 2020-10-30 MED ORDER — TOPIRAMATE 50 MG PO TABS
50.0000 mg | ORAL_TABLET | Freq: Every day | ORAL | 3 refills | Status: DC
Start: 2020-10-30 — End: 2021-10-31

## 2020-10-30 MED ORDER — ROSUVASTATIN CALCIUM 10 MG PO TABS
10.0000 mg | ORAL_TABLET | Freq: Every day | ORAL | 3 refills | Status: DC
Start: 2020-10-30 — End: 2021-10-31

## 2020-10-30 MED ORDER — HYDROCHLOROTHIAZIDE 25 MG PO TABS
ORAL_TABLET | ORAL | 3 refills | Status: DC
Start: 2020-10-30 — End: 2021-10-31

## 2020-10-30 MED ORDER — SUMATRIPTAN 20 MG/ACT NA SOLN: 20.0000 mg | NASAL | 5 refills | Status: DC | PRN

## 2020-10-30 MED ORDER — FLUOXETINE HCL 20 MG PO CAPS
60.0000 mg | ORAL_CAPSULE | Freq: Every day | ORAL | 3 refills | Status: DC
Start: 2020-10-30 — End: 2022-02-11

## 2020-10-30 MED ORDER — CYCLOBENZAPRINE HCL 10 MG PO TABS
5.0000 mg | ORAL_TABLET | Freq: Three times a day (TID) | ORAL | 5 refills | Status: DC | PRN
Start: 2020-10-30 — End: 2021-04-25

## 2020-10-30 NOTE — Assessment & Plan Note (Signed)
Routine exam w/o abn Pap obt  Nl breast exam

## 2020-10-30 NOTE — Assessment & Plan Note (Signed)
Well controlled with topamax imitrex prn

## 2020-10-30 NOTE — Assessment & Plan Note (Signed)
Disc goals for lipids and reasons to control them Rev last labs with pt Rev low sat fat diet in detail  LDL up significantly off crestor  She plans to re start it as it was not cause of her muscle pain

## 2020-10-30 NOTE — Assessment & Plan Note (Signed)
Reviewed health habits including diet and exercise and skin cancer prevention Reviewed appropriate screening tests for age  Also reviewed health mt list, fam hx and immunization status , as well as social and family history   See HPI Gyn exam done  utd vaccines incl covid  Nl dexa in 2013   No falls or fx/taking vit D Enc low impact exercise for fibromyalgia

## 2020-10-30 NOTE — Assessment & Plan Note (Signed)
Pt has seen rheumatology and no auto immune source found Disc first line tx of low impact exercise  She prefers not to take medication for this currently

## 2020-10-30 NOTE — Progress Notes (Signed)
Subjective:    Patient ID: Pamela Anthony, female    DOB: 18-Aug-1956, 64 y.o.   MRN: [id number]  This visit occurred during the SARS-CoV-2 public health emergency.  Safety protocols were in place, including screening questions prior to the visit, additional usage of staff PPE, and extensive cleaning of exam room while observing appropriate contact time as indicated for disinfecting solutions.    HPI Here for health maintenance exam and to review chronic medical problems    Wt Readings from Last 3 Encounters:  10/30/20 215 lb 5 oz (97.7 kg)  09/14/20 212 lb 3 oz (96.2 kg)  04/28/20 211 lb 1 oz (95.7 kg)   34.23 kg/m   Went to the rheumatologist for ANA  Blood work was ok  Diagnosed with fibromyalgia  Did not want to take medication  She stretches every day   Has a clicking elbow - may have injury there    Pap 9/14-nl Wants to do that today   Mammogram 10/21 Self breast exam -no lumps   Tdap 9/15 Zoster status -had the shigrix vaccines  Flu shot 9/21 pna vaccine 11/20   covid status -immunized with booster pfizer   Colonoscopy 5/19    dexa 9/13 Normal  Falls/fractures -none  Supplements -takes vit D Exercise-walking and stretching   BP Readings from Last 3 Encounters:  10/30/20 116/68  09/14/20 114/70  04/28/20 124/78    Pulse Readings from Last 3 Encounters:  10/30/20 78  09/14/20 78  04/28/20 69   Lab Results  Component Value Date   CREATININE 0.77 10/23/2020   BUN 17 10/23/2020   NA 142 10/23/2020   K 3.4 (L) 10/23/2020   CL 103 10/23/2020   CO2 30 10/23/2020    B12 def Lab Results  Component Value Date   VITAMINB12 1,125 (H) 10/23/2020  saw that was high so stopped her 5000 mcg pill    Depression with anxiety  Fluoxetine 60 mg daily  Stress is down a bit   Edema  hctz 12.5 to 25 mg daily   topamax for HA prev - headaches are not too bad  imitrex for breakthrough  occ get muscle spasms in neck  (neck stays stiff)     Hyperlipidemia Lab Results  Component Value Date   CHOL 223 (H) 10/23/2020   CHOL 129 10/20/2019   CHOL 135 07/29/2018   Lab Results  Component Value Date   HDL 43.50 10/23/2020   HDL 41.30 10/20/2019   HDL 41.30 07/29/2018   Lab Results  Component Value Date   LDLCALC 151 (H) 10/23/2020   LDLCALC 68 10/20/2019   LDLCALC 63 07/29/2018   Lab Results  Component Value Date   TRIG 142.0 10/23/2020   TRIG 98.0 10/20/2019   TRIG 154.0 (H) 07/29/2018   Lab Results  Component Value Date   CHOLHDL 5 10/23/2020   CHOLHDL 3 10/20/2019   CHOLHDL 3 07/29/2018   Lab Results  Component Value Date   LDLDIRECT 182.2 08/25/2013   LDLDIRECT 183.6 08/07/2012   LDLDIRECT 170.8 04/23/2011   Held crestor thinking it caused pain In retrospect - does not think it did and wants to try again   Prediabetes Lab Results  Component Value Date   HGBA1C 5.7 10/23/2020   Up from 5.2   Diet is not too bad but she wants to improve  Considering the whole 30 diet -considering trying it with her son   Lab Results  Component Value Date   WBC 4.3 10/23/2020  HGB 13.0 10/23/2020   HCT 38.7 10/23/2020   MCV 87.0 10/23/2020   PLT 234.0 10/23/2020   Lab Results  Component Value Date   TSH 2.28 10/23/2020    Lab Results  Component Value Date   ALT 31 10/23/2020   AST 27 10/23/2020   ALKPHOS 74 10/23/2020   BILITOT 0.5 10/23/2020    Patient Active Problem List   Diagnosis Date Noted  . Elevated antinuclear antibody (ANA) level 09/20/2020  . Candidal intertrigo 04/28/2020  . Colon cancer screening 03/18/2018  . Fatigue 03/18/2018  . Palpitations 06/20/2015  . Globus sensation 06/20/2015  . Thoracic back pain 06/20/2015  . Prediabetes 03/16/2015  . Somnolence 01/16/2015  . Stress reaction 01/16/2015  . Neck pain 07/04/2014  . Encounter for routine gynecological examination 09/01/2013  . Depression with anxiety 01/18/2013  . Post-menopausal 08/14/2012  . Other screening  mammogram 04/29/2011  . Routine general medical examination at a health care facility 04/22/2011  . Joint pain 02/28/2010  . Myofascial pain syndrome 02/28/2010  . Vitamin D deficiency 03/08/2009  . IRRITABLE BOWEL SYNDROME 06/27/2008  . B12 deficiency 02/23/2008  . POLYP, COLON 03/06/2007  . Hyperlipidemia 03/06/2007  . ALLERGIC RHINITIS 03/06/2007  . GERD 03/06/2007  . HIATAL HERNIA 03/06/2007  . LOW BACK PAIN 03/06/2007  . OSA (obstructive sleep apnea) 03/06/2007  . URINARY INCONTINENCE 03/06/2007  . Migraine 03/06/2007   Past Medical History:  Diagnosis Date  . Allergic rhinitis   . Allergy   . Anxiety   . B12 deficiency   . Degenerative disc disease    back  . Fungal infection 2005   lamasil   . GERD (gastroesophageal reflux disease)    EGD 10/2004  . Hemorrhoids, internal    colonoscopy 02/2008  . Hyperlipemia   . Iron deficiency anemia   . Low back pain   . Obesity   . Sleep apnea    wears cpap  . Urinary incontinence   . Vitamin D deficiency    mild   Past Surgical History:  Procedure Laterality Date  . CESAREAN SECTION  1985  . COLONOSCOPY    . DILATION AND CURETTAGE OF UTERUS  2005  . HAND SURGERY  2000   nodule removed  . KNEE SURGERY  1970's   right  . NASAL SINUS SURGERY  2000's   right maxillary  . TONSILLECTOMY AND ADENOIDECTOMY     Social History   Tobacco Use  . Smoking status: Never Smoker  . Smokeless tobacco: Never Used  Vaping Use  . Vaping Use: Never used  Substance Use Topics  . Alcohol use: Yes    Alcohol/week: 0.0 standard drinks    Comment: occasional  . Drug use: No   Family History  Problem Relation Age of Onset  . Coronary artery disease Father        in his 2's  . Arthritis Father   . Stroke Father   . Heart disease Father        pacemaker  . Prostate cancer Father   . Breast cancer Maternal Grandmother   . Colon cancer Other   . Breast cancer Cousin        mat cousin  . Colon polyps Neg Hx    Allergies   Allergen Reactions  . Atorvastatin     REACTION: myalgias  . Oxybutynin     REACTION: rash with  patch    Current Outpatient Medications on File Prior to Visit  Medication Sig Dispense Refill  .  Calcium Carbonate-Vitamin D (CALTRATE 600+D) 600-400 MG-UNIT per tablet Take 1 tablet by mouth daily.      . Cholecalciferol (VITAMIN D-3 PO) Take 1 tablet by mouth daily.    . Cyanocobalamin (B-12 PO) Take by mouth daily.    Marland Kitchen ketoconazole (NIZORAL) 2 % cream Apply 1 application topically daily. 30 g 1  . mometasone (ELOCON) 0.1 % cream   0   No current facility-administered medications on file prior to visit.     Review of Systems  Constitutional: Negative for activity change, appetite change, fatigue, fever and unexpected weight change.  HENT: Negative for congestion, ear pain, rhinorrhea, sinus pressure and sore throat.   Eyes: Negative for pain, redness and visual disturbance.  Respiratory: Negative for cough, shortness of breath and wheezing.   Cardiovascular: Negative for chest pain and palpitations.  Gastrointestinal: Negative for abdominal pain, blood in stool, constipation and diarrhea.  Endocrine: Negative for polydipsia and polyuria.  Genitourinary: Negative for dysuria, frequency and urgency.  Musculoskeletal: Positive for arthralgias, myalgias and neck pain. Negative for back pain and joint swelling.  Skin: Negative for pallor and rash.  Allergic/Immunologic: Negative for environmental allergies.  Neurological: Negative for dizziness, syncope and headaches.  Hematological: Negative for adenopathy. Does not bruise/bleed easily.  Psychiatric/Behavioral: Negative for decreased concentration and dysphoric mood. The patient is not nervous/anxious.        Objective:   Physical Exam Constitutional:      General: She is not in acute distress.    Appearance: Normal appearance. She is well-developed. She is obese. She is not ill-appearing or diaphoretic.  HENT:     Head:  Normocephalic and atraumatic.     Right Ear: Tympanic membrane, ear canal and external ear normal.     Left Ear: Tympanic membrane, ear canal and external ear normal.     Nose: Nose normal. No congestion.     Mouth/Throat:     Mouth: Mucous membranes are moist.     Pharynx: Oropharynx is clear. No posterior oropharyngeal erythema.  Eyes:     General: No scleral icterus.    Extraocular Movements: Extraocular movements intact.     Conjunctiva/sclera: Conjunctivae normal.     Pupils: Pupils are equal, round, and reactive to light.  Neck:     Thyroid: No thyromegaly.     Vascular: No carotid bruit or JVD.  Cardiovascular:     Rate and Rhythm: Normal rate and regular rhythm.     Pulses: Normal pulses.     Heart sounds: Normal heart sounds. No gallop.   Pulmonary:     Effort: Pulmonary effort is normal. No respiratory distress.     Breath sounds: Normal breath sounds. No wheezing.     Comments: Good air exch Chest:     Chest wall: No tenderness.  Abdominal:     General: Bowel sounds are normal. There is no distension or abdominal bruit.     Palpations: Abdomen is soft. There is no mass.     Tenderness: There is no abdominal tenderness.     Hernia: No hernia is present.  Genitourinary:    Comments: Breast exam: No mass, nodules, thickening, tenderness, bulging, retraction, inflamation, nipple discharge or skin changes noted.  No axillary or clavicular LA.              Anus appears normal w/o hemorrhoids or masses     External genitalia : nl appearance and hair distribution/no lesions     Urethral meatus : nl size, no lesions  or prolapse     Urethra: no masses, tenderness or scarring    Bladder : no masses or tenderness     Vagina: nl general appearance, no discharge or  Lesions, no significant cystocele  or rectocele     Cervix: no lesions/ discharge or friability    Uterus: nl size, contour, position, and mobility (not fixed) , non tender    Adnexa : no  masses, tenderness, enlargement or nodularity       Musculoskeletal:        General: No tenderness. Normal range of motion.     Cervical back: Normal range of motion and neck supple. No rigidity. No muscular tenderness.     Right lower leg: No edema.     Left lower leg: No edema.  Lymphadenopathy:     Cervical: No cervical adenopathy.  Skin:    General: Skin is warm and dry.     Coloration: Skin is not pale.     Findings: No erythema or rash.     Comments: Solar lentigines diffusely   Neurological:     Mental Status: She is alert. Mental status is at baseline.     Cranial Nerves: No cranial nerve deficit.     Motor: No abnormal muscle tone.     Coordination: Coordination normal.     Gait: Gait normal.     Deep Tendon Reflexes: Reflexes are normal and symmetric. Reflexes normal.  Psychiatric:        Mood and Affect: Mood normal.        Cognition and Memory: Cognition and memory normal.           Assessment & Plan:   Problem List Items Addressed This Visit      Cardiovascular and Mediastinum   Migraine    Well controlled with topamax imitrex prn      Relevant Medications   rosuvastatin (CRESTOR) 10 MG tablet   cyclobenzaprine (FLEXERIL) 10 MG tablet   FLUoxetine (PROZAC) 20 MG capsule   hydrochlorothiazide (HYDRODIURIL) 25 MG tablet   topiramate (TOPAMAX) 50 MG tablet   SUMAtriptan (IMITREX) 20 MG/ACT nasal spray     Musculoskeletal and Integument   Myofascial pain syndrome    Pt has seen rheumatology and no auto immune source found Disc first line tx of low impact exercise  She prefers not to take medication for this currently      Relevant Medications   cyclobenzaprine (FLEXERIL) 10 MG tablet   FLUoxetine (PROZAC) 20 MG capsule   topiramate (TOPAMAX) 50 MG tablet     Other   B12 deficiency    Lab Results  Component Value Date   VITAMINB12 1,125 (H) 10/23/2020   Pt plans to hold her B12 (takes 5000 mcg daily) for at least a month and then return  to it every other day      Hyperlipidemia    Disc goals for lipids and reasons to control them Rev last labs with pt Rev low sat fat diet in detail  LDL up significantly off crestor  She plans to re start it as it was not cause of her muscle pain       Relevant Medications   rosuvastatin (CRESTOR) 10 MG tablet   hydrochlorothiazide (HYDRODIURIL) 25 MG tablet   Routine general medical examination at a health care facility - Primary    Reviewed health habits including diet and exercise and skin cancer prevention Reviewed appropriate screening tests for age  Also reviewed health mt list, fam hx  and immunization status , as well as social and family history   See HPI Gyn exam done  utd vaccines incl covid  Nl dexa in 2013   No falls or fx/taking vit D Enc low impact exercise for fibromyalgia      Depression with anxiety    Stable as long as she continues fluoxetine  Stress is down fortunately Encouraged exercise as tolerated      Relevant Medications   FLUoxetine (PROZAC) 20 MG capsule   Encounter for routine gynecological examination    Routine exam w/o abn Pap obt  Nl breast exam      Relevant Orders   Cytology - PAP([place])   Prediabetes    Lab Results  Component Value Date   HGBA1C 5.7 10/23/2020   disc imp of low glycemic diet and wt loss to prevent DM2

## 2020-10-30 NOTE — Assessment & Plan Note (Signed)
Lab Results  Component Value Date   VITAMINB12 1,125 (H) 10/23/2020   Pt plans to hold her B12 (takes 5000 mcg daily) for at least a month and then return to it every other day

## 2020-10-30 NOTE — Patient Instructions (Addendum)
Stop B12 for a month  Then take it every other day   Get back on your generic crestor   For health Try to get most of your carbohydrates from produce (with the exception of white potatoes)  Eat less bread/pasta/rice/snack foods/cereals/sweets and other items from the middle of the grocery store (processed carbs)

## 2020-10-30 NOTE — Assessment & Plan Note (Signed)
Stable as long as she continues fluoxetine  Stress is down fortunately Encouraged exercise as tolerated

## 2020-10-30 NOTE — Assessment & Plan Note (Signed)
Lab Results  Component Value Date   HGBA1C 5.7 10/23/2020   disc imp of low glycemic diet and wt loss to prevent DM2

## 2020-11-06 LAB — CYTOLOGY - PAP: Diagnosis: NEGATIVE

## 2020-12-18 DIAGNOSIS — J310 Chronic rhinitis: Secondary | ICD-10-CM | POA: Diagnosis not present

## 2020-12-18 DIAGNOSIS — J328 Other chronic sinusitis: Secondary | ICD-10-CM | POA: Diagnosis not present

## 2021-01-03 DIAGNOSIS — Z20822 Contact with and (suspected) exposure to covid-19: Secondary | ICD-10-CM | POA: Diagnosis not present

## 2021-02-01 ENCOUNTER — Encounter: Payer: Self-pay | Admitting: Family Medicine

## 2021-02-01 MED ORDER — SUMATRIPTAN SUCCINATE 100 MG PO TABS: 100.0000 mg | ORAL_TABLET | ORAL | 3 refills | Status: DC | PRN

## 2021-02-20 DIAGNOSIS — H2513 Age-related nuclear cataract, bilateral: Secondary | ICD-10-CM | POA: Diagnosis not present

## 2021-04-25 ENCOUNTER — Telehealth: Payer: Self-pay

## 2021-04-25 ENCOUNTER — Ambulatory Visit
Admission: EM | Admit: 2021-04-25 | Discharge: 2021-04-25 | Disposition: A | Discharge status: Home / Self Care | Payer: BC Managed Care – PPO | Attending: Family Medicine | Admitting: Family Medicine

## 2021-04-25 DIAGNOSIS — M546 Pain in thoracic spine: Secondary | ICD-10-CM | POA: Diagnosis not present

## 2021-04-25 MED ORDER — BACLOFEN 10 MG PO TABS: 10.0000 mg | ORAL_TABLET | Freq: Three times a day (TID) | ORAL | 0 refills | Status: DC | PRN

## 2021-04-25 NOTE — Telephone Encounter (Signed)
I spoke with Dr Glori Bickers and there are no available appts on 04/25/21; pt said she cannot leave work today and when gets off work pt will go to [facility] in [place]. Sending FYI to Dr Glori Bickers.

## 2021-04-25 NOTE — ED Triage Notes (Signed)
Pt presents with R sided back pain that is a constant dull pain that worsens to sharp pain with certain movements and she describes as a rib pain.  Also states that approx one week ago she had one episode of pressure across chest that started at occurred while at rest.  Lasted approx 15 minutes.  Denies any associated symptoms with the chest pressure including dizziness, n/v, pain radiating to arm/neck, SOB.  Pt states she has been under significant stress with the passing of a family member and surgery for another family member.

## 2021-04-25 NOTE — Telephone Encounter (Signed)
Aware, thanks!

## 2021-04-25 NOTE — ED Provider Notes (Signed)
[facility]    CSN: [id number] Arrival date & time: 04/25/21  1819      History   Chief Complaint Chief Complaint  Patient presents with  . Back Pain  . Chest Pain   HPI   65 year old female presents with the above complaints.  Patient reports a 2-week history of right-sided thoracic back pain (below the right scapula).  Dull pain but worsens with certain activities/range of motion.  No known fall, trauma, injury.  Has not responded to ibuprofen.  She rates her pain as 2/10 in severity.  Additionally, approximately 3 weeks ago she had an episode of diffuse anterior chest pain.  Occurred while she was at a funeral.  Lasted approximately 15 minutes and resolved.  Was not associated with any other symptoms.  Has not recurred.  No other complaints at this time.  Past Medical History:  Diagnosis Date  . Allergic rhinitis   . Allergy   . Anxiety   . B12 deficiency   . Degenerative disc disease    back  . Fungal infection 2005   lamasil   . GERD (gastroesophageal reflux disease)    EGD 10/2004  . Hemorrhoids, internal    colonoscopy 02/2008  . Hyperlipemia   . Iron deficiency anemia   . Low back pain   . Obesity   . Sleep apnea    wears cpap  . Urinary incontinence   . Vitamin D deficiency    mild    Patient Active Problem List   Diagnosis Date Noted  . Elevated antinuclear antibody (ANA) level 09/20/2020  . Candidal intertrigo 04/28/2020  . Colon cancer screening 03/18/2018  . Prediabetes 03/16/2015  . Encounter for routine gynecological examination 09/01/2013  . Depression with anxiety 01/18/2013  . Post-menopausal 08/14/2012  . Other screening mammogram 04/29/2011  . Routine general medical examination at a health care facility 04/22/2011  . Joint pain 02/28/2010  . Myofascial pain syndrome 02/28/2010  . Vitamin D deficiency 03/08/2009  . IRRITABLE BOWEL SYNDROME 06/27/2008  . B12 deficiency 02/23/2008  . POLYP, COLON 03/06/2007  . Hyperlipidemia  03/06/2007  . GERD 03/06/2007  . HIATAL HERNIA 03/06/2007  . OSA (obstructive sleep apnea) 03/06/2007  . URINARY INCONTINENCE 03/06/2007  . Migraine 03/06/2007    Past Surgical History:  Procedure Laterality Date  . CESAREAN SECTION  1985  . COLONOSCOPY    . DILATION AND CURETTAGE OF UTERUS  2005  . HAND SURGERY  2000   nodule removed  . KNEE SURGERY  1970's   right  . NASAL SINUS SURGERY  2000's   right maxillary  . TONSILLECTOMY AND ADENOIDECTOMY      OB History   No obstetric history on file.      Home Medications    Prior to Admission medications   Medication Sig Start Date End Date Taking? Authorizing Provider  baclofen (LIORESAL) 10 MG tablet Take 1 tablet (10 mg total) by mouth 3 (three) times daily as needed for muscle spasms. 04/25/21  Yes Tamlyn Sides G, DO  Calcium Carbonate-Vitamin D 600-400 MG-UNIT tablet Take 1 tablet by mouth daily.     Yes [provider]  Cholecalciferol (VITAMIN D-3 PO) Take 1 tablet by mouth daily.   Yes [provider]  Cyanocobalamin (B-12 PO) Take by mouth daily.   Yes [provider]  FLUoxetine (PROZAC) 20 MG capsule Take 3 capsules (60 mg total) by mouth daily. 10/30/20  Yes Tower, Wynelle Fanny, MD  hydrochlorothiazide (HYDRODIURIL) 25 MG tablet  TAKE 1/2 TO 1 (ONE-HALF TO ONE) TABLET BY MOUTH ONCE DAILY AS NEEDED 10/30/20  Yes Tower, Marne A, MD  ketoconazole (NIZORAL) 2 % cream Apply 1 application topically daily. 04/28/20  Yes Tower, Wynelle Fanny, MD  mometasone (ELOCON) 0.1 % cream  03/11/18  Yes [provider]  omeprazole (PRILOSEC) 20 MG capsule Take 1 capsule (20 mg total) by mouth daily. 10/30/20  Yes Tower, Wynelle Fanny, MD  oxybutynin (DITROPAN-XL) 10 MG 24 hr tablet Take 1 tablet (10 mg total) by mouth at bedtime. 10/30/20  Yes Tower, Wynelle Fanny, MD  rosuvastatin (CRESTOR) 10 MG tablet Take 1 tablet (10 mg total) by mouth daily. 10/30/20  Yes Tower, Wynelle Fanny, MD  SUMAtriptan (IMITREX) 100 MG tablet Take 1  tablet (100 mg total) by mouth every 2 (two) hours as needed for migraine. May repeat in 2 hours if headache persists or recurs. 02/01/21  Yes Tower, Wynelle Fanny, MD  topiramate (TOPAMAX) 50 MG tablet Take 1 tablet (50 mg total) by mouth at bedtime. 10/30/20  Yes Tower, Wynelle Fanny, MD  SUMAtriptan (IMITREX) 20 MG/ACT nasal spray Place 1 spray (20 mg total) into the nose every 2 (two) hours as needed for migraine. May repeat in 2 hours if headache persists or recurs. 10/30/20 04/25/21  Tower, Wynelle Fanny, MD    Family History Family History  Problem Relation Age of Onset  . Coronary artery disease Father        in his 46's  . Arthritis Father   . Stroke Father   . Heart disease Father        pacemaker  . Prostate cancer Father   . Breast cancer Maternal Grandmother   . Colon cancer Other   . Breast cancer Cousin        mat cousin  . Colon polyps Neg Hx     Social History Social History   Tobacco Use  . Smoking status: Never Smoker  . Smokeless tobacco: Never Used  Vaping Use  . Vaping Use: Never used  Substance Use Topics  . Alcohol use: Yes    Alcohol/week: 0.0 standard drinks    Comment: occasional  . Drug use: No     Allergies   Atorvastatin and Oxybutynin   Review of Systems Review of Systems Per HPI  Physical Exam Triage Vital Signs ED Triage Vitals  Enc Vitals Group     BP 04/25/21 1956 113/75     Pulse Rate 04/25/21 1956 72     Resp --      Temp 04/25/21 1956 98.6 F (37 C)     Temp Source 04/25/21 1956 Oral     SpO2 04/25/21 1956 98 %     Weight --      Height --      Head Circumference --      Peak Flow --      Pain Score 04/25/21 1951 2     Pain Loc --      Pain Edu? --      Excl. in [place]? --    No data found.  Updated Vital Signs BP 113/75 (BP Location: Left Arm)   Pulse 72   Temp 98.6 F (37 C) (Oral)   LMP 04/17/2011   SpO2 98%   Visual Acuity Right Eye Distance:   Left Eye Distance:   Bilateral Distance:    Right Eye Near:   Left Eye  Near:    Bilateral Near:     Physical Exam Vitals  and nursing note reviewed.  Constitutional:      General: She is not in acute distress.    Appearance: Normal appearance. She is obese. She is not ill-appearing.  HENT:     Head: Normocephalic and atraumatic.  Eyes:     General:        Right eye: No discharge.        Left eye: No discharge.     Conjunctiva/sclera: Conjunctivae normal.  Cardiovascular:     Rate and Rhythm: Normal rate and regular rhythm.     Heart sounds: No murmur heard.   Pulmonary:     Effort: Pulmonary effort is normal.     Breath sounds: Normal breath sounds. No wheezing, rhonchi or rales.  Musculoskeletal:     Comments: Thoracic spine -no discrete areas of tenderness.  Neurological:     Mental Status: She is alert.  Psychiatric:        Mood and Affect: Mood normal.        Behavior: Behavior normal.    UC Treatments / Results  Labs (all labs ordered are listed, but only abnormal results are displayed) Labs Reviewed - No data to display  EKG Interpretation: Normal sinus rhythm with rate of 71.  Occasional PVC.  Nonspecific T wave changes.  Radiology No results found.  Procedures Procedures (including critical care time)  Medications Ordered in UC Medications - No data to display  Initial Impression / Assessment and Plan / UC Course  I have reviewed the triage vital signs and the nursing notes.  Pertinent labs & imaging results that were available during my care of the patient were reviewed by me and considered in my medical decision making (see chart for details).    65 year old female presents with acute right-sided thoracic back pain.  This appears to be musculoskeletal in origin.  No red flags.  No recent fall, trauma, injury.  Advise heat and baclofen.  Supportive care.  Final Clinical Impressions(s) / UC Diagnoses   Final diagnoses:  Acute right-sided thoracic back pain     Discharge Instructions     Rest. Lots of  heat.  Medication as directed.  If persists, follow up with PCP.  Take care  Dr. Lacinda Axon    ED Prescriptions    Medication Sig Dispense Auth. Provider   baclofen (LIORESAL) 10 MG tablet Take 1 tablet (10 mg total) by mouth 3 (three) times daily as needed for muscle spasms. 62 each Coral Spikes, DO     PDMP not reviewed this encounter.   Coral Spikes, [place] 04/25/21 2037

## 2021-04-25 NOTE — Telephone Encounter (Signed)
Pt called back and has decided she does not want to go to UC and wants to be worked in to be seen today.

## 2021-04-25 NOTE — Discharge Instructions (Signed)
Rest. Lots of heat.  Medication as directed.  If persists, follow up with PCP.  Take care  Dr. Lacinda Axon

## 2021-04-25 NOTE — Telephone Encounter (Signed)
Pt said for 2 wks has had upper dull rt side back pain below the shoulder blade that is constant but worse when moves. pts mom just passed away and she had lung cancer so pt is very apprehensive about her symptoms. Pt said no chest pain but has had soreness in chest. No pain in neck, throat, shoulder or arm. No difficulty in breathing. Post nasal drip and dry cough. Pt said her entire body aches. No diarrhea or vomiting. No S/T or runny nose or head congestion. UC & ED precautions given and pt voiced understanding. Pt is going to [facility] today. Sending note to Dr Glori Bickers.

## 2021-04-25 NOTE — Telephone Encounter (Signed)
I cannot add on today or tomorrow unfortunately    Can use the 12:30 on Friday but not sure she wants to wait that long

## 2021-05-08 ENCOUNTER — Telehealth: Payer: Self-pay | Admitting: *Deleted

## 2021-05-08 DIAGNOSIS — Z03818 Encounter for observation for suspected exposure to other biological agents ruled out: Secondary | ICD-10-CM | POA: Diagnosis not present

## 2021-05-08 DIAGNOSIS — Z20822 Contact with and (suspected) exposure to covid-19: Secondary | ICD-10-CM | POA: Diagnosis not present

## 2021-05-08 NOTE — Telephone Encounter (Signed)
The DM may help more in terms of cough control, thanks

## 2021-05-08 NOTE — Telephone Encounter (Signed)
Pt is aware and voices understanding.

## 2021-05-08 NOTE — Telephone Encounter (Addendum)
Patient called stating that she started with a scratching throat and stuffy nose Thursday. Patient stated that she did a home covid test and it was negative. Patient stated since the test was negative she went out of town to visit a relative. Patient stated that she came home and started feeling worse. Patient stated that she did another home covid test Sunday and it was positive. Patient was advised that she needs to be in quarantine. Patient stated that she has advised her relative that she has tested positive for covid. Patient stated that she has a hard cough and has been taking Mucinex D. Patient stated that she feels like she is getting bronchitis and needs something to break the congestion up in her chest. Patient denies SOB, fever or difficulty breathing. Patient was advised that she should go to an UC so that someone can listen to her lungs. Patient stated that she has been to an UC and her co-pay is double and last time she had to wait 3 hours. Patient was given information on the UC at [facility] and stated that she will consider going there. Patient was given ER precautions and she verbalized understanding. Patient stated that she can not understanding why she can not at least come into the office for a chest x-ray. Patient was advised that we can not bring anyone into the office with a positive covid test. Patient was advised that this message will go back to Dr. Glori Bickers for her to be aware of what has been recommended. Patient stated that she went today to a drive thru testing site for a PCR test.

## 2021-05-08 NOTE — Telephone Encounter (Signed)
Pt asks if this should be dm or plain Mucinex

## 2021-05-08 NOTE — Telephone Encounter (Signed)
For chest congestion I recommend mucinex or robitussin otc with lots of fluids  UC or virtual visit here with first available are both options   Rest, drink fluids and watch temp  If severe or sob-go to ER

## 2021-05-09 ENCOUNTER — Ambulatory Visit (INDEPENDENT_AMBULATORY_CARE_PROVIDER_SITE_OTHER): Payer: BC Managed Care – PPO

## 2021-05-09 ENCOUNTER — Other Ambulatory Visit: Payer: Self-pay

## 2021-05-09 ENCOUNTER — Ambulatory Visit
Admission: RE | Admit: 2021-05-09 | Discharge: 2021-05-09 | Disposition: A | Discharge status: Home / Self Care | Payer: BC Managed Care – PPO | Source: Ambulatory Visit | Attending: Family Medicine | Admitting: Family Medicine

## 2021-05-09 VITALS — BP 117/69 | HR 75 | Temp 98.8°F | Resp 18 | Ht 66.0 in | Wt 215.4 lb

## 2021-05-09 DIAGNOSIS — R059 Cough, unspecified: Secondary | ICD-10-CM | POA: Diagnosis not present

## 2021-05-09 DIAGNOSIS — U071 COVID-19: Secondary | ICD-10-CM

## 2021-05-09 MED ORDER — PROMETHAZINE-DM 6.25-15 MG/5ML PO SYRP: 5.0000 mL | ORAL_SOLUTION | Freq: Four times a day (QID) | ORAL | 0 refills | Status: DC | PRN

## 2021-05-09 NOTE — Discharge Instructions (Signed)
Medication as prescribed.  Take care  Dr. Brant Peets  

## 2021-05-09 NOTE — ED Triage Notes (Signed)
Patient was COVID positive on Sunday and then took a PCR test yesterday which was also positive. She is c/o chest congestion.

## 2021-05-09 NOTE — ED Provider Notes (Signed)
[facility]    CSN: [id number] Arrival date & time: 05/09/21  0934   History   Chief Complaint Chief Complaint  Patient presents with  . Covid Positive   HPI  65 year old female presents with cough in the setting of a recent positive COVID test.  Patient states that she began to get symptomatic on May 21st.  She reports that she has had headaches, body aches, chills, congestion, sore throat, and cough.  She took a home COVID test on 5/24 and it was negative.  She retested again on Sunday and testing was positive.  She also took a PCR test yesterday and it was positive.  She contacted her PCPs office and was advised to come here for evaluation.  She continues to have cough.  No fever.  She has taken Mucinex DM without any improvement.  No other complaints at this time.  Past Medical History:  Diagnosis Date  . Allergic rhinitis   . Allergy   . Anxiety   . B12 deficiency   . Degenerative disc disease    back  . Fungal infection 2005   lamasil   . GERD (gastroesophageal reflux disease)    EGD 10/2004  . Hemorrhoids, internal    colonoscopy 02/2008  . Hyperlipemia   . Iron deficiency anemia   . Low back pain   . Obesity   . Sleep apnea    wears cpap  . Urinary incontinence   . Vitamin D deficiency    mild    Patient Active Problem List   Diagnosis Date Noted  . Elevated antinuclear antibody (ANA) level 09/20/2020  . Candidal intertrigo 04/28/2020  . Colon cancer screening 03/18/2018  . Prediabetes 03/16/2015  . Encounter for routine gynecological examination 09/01/2013  . Depression with anxiety 01/18/2013  . Post-menopausal 08/14/2012  . Other screening mammogram 04/29/2011  . Routine general medical examination at a health care facility 04/22/2011  . Joint pain 02/28/2010  . Myofascial pain syndrome 02/28/2010  . Vitamin D deficiency 03/08/2009  . IRRITABLE BOWEL SYNDROME 06/27/2008  . B12 deficiency 02/23/2008  . POLYP, COLON 03/06/2007  .  Hyperlipidemia 03/06/2007  . GERD 03/06/2007  . HIATAL HERNIA 03/06/2007  . OSA (obstructive sleep apnea) 03/06/2007  . URINARY INCONTINENCE 03/06/2007  . Migraine 03/06/2007    Past Surgical History:  Procedure Laterality Date  . CESAREAN SECTION  1985  . COLONOSCOPY    . DILATION AND CURETTAGE OF UTERUS  2005  . HAND SURGERY  2000   nodule removed  . KNEE SURGERY  1970's   right  . NASAL SINUS SURGERY  2000's   right maxillary  . TONSILLECTOMY AND ADENOIDECTOMY      OB History   No obstetric history on file.      Home Medications    Prior to Admission medications   Medication Sig Start Date End Date Taking? Authorizing Provider  baclofen (LIORESAL) 10 MG tablet Take 1 tablet (10 mg total) by mouth 3 (three) times daily as needed for muscle spasms. 04/25/21  Yes Keely Drennan G, DO  Calcium Carbonate-Vitamin D 600-400 MG-UNIT tablet Take 1 tablet by mouth daily.     Yes [provider]  Cholecalciferol (VITAMIN D-3 PO) Take 1 tablet by mouth daily.   Yes [provider]  Cyanocobalamin (B-12 PO) Take by mouth daily.   Yes [provider]  FLUoxetine (PROZAC) 20 MG capsule Take 3 capsules (60 mg total) by mouth daily. 10/30/20  Yes Tower, Wynelle Fanny, MD  hydrochlorothiazide (HYDRODIURIL) 25 MG tablet TAKE 1/2 TO 1 (ONE-HALF TO ONE) TABLET BY MOUTH ONCE DAILY AS NEEDED 10/30/20  Yes Tower, Marne A, MD  ketoconazole (NIZORAL) 2 % cream Apply 1 application topically daily. 04/28/20  Yes Tower, Wynelle Fanny, MD  mometasone (ELOCON) 0.1 % cream  03/11/18  Yes [provider]  omeprazole (PRILOSEC) 20 MG capsule Take 1 capsule (20 mg total) by mouth daily. 10/30/20  Yes Tower, Wynelle Fanny, MD  oxybutynin (DITROPAN-XL) 10 MG 24 hr tablet Take 1 tablet (10 mg total) by mouth at bedtime. 10/30/20  Yes Tower, Wynelle Fanny, MD  promethazine-dextromethorphan (PROMETHAZINE-DM) 6.25-15 MG/5ML syrup Take 5 mLs by mouth 4 (four) times daily as needed for cough. 05/09/21  Yes  Kimiyah Blick G, DO  rosuvastatin (CRESTOR) 10 MG tablet Take 1 tablet (10 mg total) by mouth daily. 10/30/20  Yes Tower, Wynelle Fanny, MD  SUMAtriptan (IMITREX) 100 MG tablet Take 1 tablet (100 mg total) by mouth every 2 (two) hours as needed for migraine. May repeat in 2 hours if headache persists or recurs. 02/01/21  Yes Tower, Wynelle Fanny, MD  topiramate (TOPAMAX) 50 MG tablet Take 1 tablet (50 mg total) by mouth at bedtime. 10/30/20  Yes Tower, Wynelle Fanny, MD  SUMAtriptan (IMITREX) 20 MG/ACT nasal spray Place 1 spray (20 mg total) into the nose every 2 (two) hours as needed for migraine. May repeat in 2 hours if headache persists or recurs. 10/30/20 04/25/21  Tower, Wynelle Fanny, MD    Family History Family History  Problem Relation Age of Onset  . Coronary artery disease Father        in his 30's  . Arthritis Father   . Stroke Father   . Heart disease Father        pacemaker  . Prostate cancer Father   . Breast cancer Maternal Grandmother   . Colon cancer Other   . Breast cancer Cousin        mat cousin  . Colon polyps Neg Hx     Social History Social History   Tobacco Use  . Smoking status: Never Smoker  . Smokeless tobacco: Never Used  Vaping Use  . Vaping Use: Never used  Substance Use Topics  . Alcohol use: Yes    Alcohol/week: 0.0 standard drinks    Comment: occasional  . Drug use: No     Allergies   Atorvastatin and Oxybutynin   Review of Systems Review of Systems Per HPI  Physical Exam Triage Vital Signs ED Triage Vitals  Enc Vitals Group     BP 05/09/21 0958 117/69     Pulse Rate 05/09/21 0955 75     Resp 05/09/21 0955 18     Temp 05/09/21 0955 98.8 F (37.1 C)     Temp Source 05/09/21 0955 Oral     SpO2 05/09/21 0955 100 %     Weight 05/09/21 0953 215 lb 6.2 oz (97.7 kg)     Height 05/09/21 0953 5\' 6"  (1.676 m)     Head Circumference --      Peak Flow --      Pain Score 05/09/21 0953 0     Pain Loc --      Pain Edu? --      Excl. in [place]? --    No data  found.  Updated Vital Signs BP 117/69   Pulse 75   Temp 98.8 F (37.1 C) (Oral)   Resp 18   Ht 5\' 6"  (  1.676 m)   Wt 97.7 kg   LMP 04/17/2011   SpO2 100%   BMI 34.76 kg/m   Visual Acuity Right Eye Distance:   Left Eye Distance:   Bilateral Distance:    Right Eye Near:   Left Eye Near:    Bilateral Near:     Physical Exam Vitals and nursing note reviewed.  Constitutional:      General: She is not in acute distress.    Appearance: Normal appearance. She is obese. She is not ill-appearing.  HENT:     Head: Normocephalic and atraumatic.  Cardiovascular:     Rate and Rhythm: Normal rate and regular rhythm.  Pulmonary:     Effort: Pulmonary effort is normal.     Breath sounds: Normal breath sounds. No wheezing, rhonchi or rales.  Neurological:     Mental Status: She is alert.  Psychiatric:        Mood and Affect: Mood normal.        Behavior: Behavior normal.    UC Treatments / Results  Labs (all labs ordered are listed, but only abnormal results are displayed) Labs Reviewed - No data to display  EKG   Radiology DG Chest 2 View  Result Date: 05/09/2021 CLINICAL DATA:  Cough, COVID-19 positive. EXAM: CHEST - 2 VIEW COMPARISON:  June 20, 2015. FINDINGS: The heart size and mediastinal contours are within normal limits. Both lungs are clear. The visualized skeletal structures are unremarkable. IMPRESSION: No active cardiopulmonary disease. Electronically Signed   By: Marijo Conception M.D.   On: 05/09/2021 10:44    Procedures Procedures (including critical care time)  Medications Ordered in UC Medications - No data to display  Initial Impression / Assessment and Plan / UC Course  I have reviewed the triage vital signs and the nursing notes.  Pertinent labs & imaging results that were available during my care of the patient were reviewed by me and considered in my medical decision making (see chart for details).    65 year old female presents with COVID-19.  She  is outside of the treatment window.  She is well-appearing on exam.  Vital signs stable.  Chest x-ray was obtained given persistent cough.  Chest x-ray was independently reviewed by me.  Interpretation: Normal chest x-ray.  No evidence of pneumonia.  Treating cough with Promethazine DM.  Supportive care.  Final Clinical Impressions(s) / UC Diagnoses   Final diagnoses:  COVID     Discharge Instructions     Medication as prescribed.  Take care  Dr. Lacinda Axon     ED Prescriptions    Medication Sig Dispense Auth. Provider   promethazine-dextromethorphan (PROMETHAZINE-DM) 6.25-15 MG/5ML syrup Take 5 mLs by mouth 4 (four) times daily as needed for cough. 118 mL Coral Spikes, DO     PDMP not reviewed this encounter.   Coral Spikes, [place] 05/09/21 1107

## 2021-06-13 DIAGNOSIS — M2242 Chondromalacia patellae, left knee: Secondary | ICD-10-CM | POA: Insufficient documentation

## 2021-06-13 DIAGNOSIS — M1712 Unilateral primary osteoarthritis, left knee: Secondary | ICD-10-CM | POA: Insufficient documentation

## 2021-06-13 DIAGNOSIS — M25562 Pain in left knee: Secondary | ICD-10-CM | POA: Insufficient documentation

## 2021-07-01 ENCOUNTER — Other Ambulatory Visit: Payer: Self-pay | Admitting: Family Medicine

## 2021-07-11 ENCOUNTER — Other Ambulatory Visit: Payer: Self-pay | Admitting: Family Medicine

## 2021-09-03 ENCOUNTER — Other Ambulatory Visit: Payer: Self-pay | Admitting: Family Medicine

## 2021-09-03 DIAGNOSIS — Z1231 Encounter for screening mammogram for malignant neoplasm of breast: Secondary | ICD-10-CM

## 2021-09-07 ENCOUNTER — Other Ambulatory Visit: Payer: Self-pay

## 2021-09-07 ENCOUNTER — Ambulatory Visit (INDEPENDENT_AMBULATORY_CARE_PROVIDER_SITE_OTHER): Payer: Managed Care, Other (non HMO)

## 2021-09-07 DIAGNOSIS — Z23 Encounter for immunization: Secondary | ICD-10-CM

## 2021-09-13 ENCOUNTER — Encounter: Payer: Self-pay | Admitting: Gastroenterology

## 2021-10-03 ENCOUNTER — Other Ambulatory Visit: Payer: Self-pay

## 2021-10-03 ENCOUNTER — Ambulatory Visit
Admission: RE | Admit: 2021-10-03 | Discharge: 2021-10-03 | Disposition: A | Discharge status: Home / Self Care | Payer: Managed Care, Other (non HMO) | Source: Ambulatory Visit | Attending: Family Medicine | Admitting: Family Medicine

## 2021-10-03 DIAGNOSIS — Z1231 Encounter for screening mammogram for malignant neoplasm of breast: Secondary | ICD-10-CM | POA: Insufficient documentation

## 2021-10-04 ENCOUNTER — Encounter: Payer: Self-pay | Admitting: Family Medicine

## 2021-10-04 NOTE — Telephone Encounter (Signed)
Called and spoke with pt . We let pt know there there was no providers available on 10.28.22 so we  let pt try to go to urgent care

## 2021-10-04 NOTE — Telephone Encounter (Signed)
Please make pt an appt with a provider at [facility] per her request

## 2021-10-16 ENCOUNTER — Ambulatory Visit: Payer: Managed Care, Other (non HMO) | Admitting: Gastroenterology

## 2021-10-16 ENCOUNTER — Encounter: Payer: Self-pay | Admitting: Gastroenterology

## 2021-10-16 VITALS — BP 124/70 | HR 96 | Ht 66.0 in | Wt 205.6 lb

## 2021-10-16 DIAGNOSIS — R140 Abdominal distension (gaseous): Secondary | ICD-10-CM

## 2021-10-16 DIAGNOSIS — K625 Hemorrhage of anus and rectum: Secondary | ICD-10-CM | POA: Diagnosis not present

## 2021-10-16 NOTE — Progress Notes (Signed)
[facility] Consult Note:  History: Pamela Anthony 10/16/2021  Referring provider: Abner Greenspan, MD  Reason for consult/chief complaint: Rectal Bleeding (No longer having rectal bleeding, since making appointment. Change in diet has helped.) and Abdominal Pain (Abdomen bloating and increased bloating- Pt states that she changed her diet - now on a gluten free diet and feels much better.)   Subjective  HPI: Pamela Anthony was self-referred today for abdominal pain and rectal bleeding.  About 6 weeks ago she had lower abdominal bloating discomfort and gassiness and a few episodes of painless rectal bleeding.  She decided to eliminate gluten and dairy (on the suggestion of her son, who had apparently done so for digestive issues), and her symptoms resolved.  She is feeling much better and bleeding resolved.  She even considered canceling today's appointment but thought she should be evaluated in case problems recurred. Denies chronic upper digestive symptoms.  Appetite's been good, and she has been able to lose some weight since cutting out gluten/carbs.  She feels that she eats a balanced diet with protein fruits and vegetables and is getting calcium from nondairy sources.  Normal screening colonoscopy with me in May 2019 (no polyps in March 2009 either) ROS:  Review of Systems  Constitutional:  Negative for appetite change and unexpected weight change.  HENT:  Negative for mouth sores and voice change.   Eyes:  Negative for pain and redness.  Respiratory:  Negative for cough and shortness of breath.   Cardiovascular:  Negative for chest pain and palpitations.  Genitourinary:  Positive for frequency. Negative for dysuria and hematuria.  Musculoskeletal:  Positive for arthralgias. Negative for myalgias.  Skin:  Negative for pallor and rash.  Neurological:  Negative for weakness and headaches.  Hematological:  Negative for adenopathy.  Psychiatric/Behavioral:         Mood  stable    Past Medical History: Past Medical History:  Diagnosis Date   Allergic rhinitis    Allergy    Anxiety    B12 deficiency    Degenerative disc disease    back   Fungal infection 2005   lamasil    GERD (gastroesophageal reflux disease)    EGD 10/2004   Hemorrhoids, internal    colonoscopy 02/2008   Hyperlipemia    Iron deficiency anemia    Low back pain    Obesity    Sleep apnea    wears cpap   Urinary incontinence    Vitamin D deficiency    mild     Past Surgical History: Past Surgical History:  Procedure Laterality Date   [place] OF UTERUS  2005   HAND SURGERY  2000   nodule removed   KNEE SURGERY  1970's   right   NASAL SINUS SURGERY  2000's   right maxillary   TONSILLECTOMY AND ADENOIDECTOMY       Family History: Family History  Problem Relation Age of Onset   Coronary artery disease Father        in his 68's   Arthritis Father    Stroke Father    Heart disease Father        pacemaker   Prostate cancer Father    Breast cancer Maternal Grandmother    Colon cancer Other    Breast cancer Cousin        mat cousin   Colon polyps Neg Hx     Social History:  Social History   Socioeconomic History   Marital status: Married    Spouse name: Not on file   Number of children: 2   Years of education: Not on file   Highest education level: Not on file  Occupational History   Occupation: customer service  Tobacco Use   Smoking status: Never   Smokeless tobacco: Never  Vaping Use   Vaping Use: Never used  Substance and Sexual Activity   Alcohol use: Yes    Alcohol/week: 0.0 standard drinks    Comment: occasional   Drug use: No   Sexual activity: Not on file  Other Topics Concern   Not on file  Social History Narrative   Not on file   Social Determinants of Health   Financial Resource Strain: Not on file  Food Insecurity: Not on file  Transportation Needs: Not on file   Physical Activity: Not on file  Stress: Not on file  Social Connections: Not on file    Allergies: Allergies  Allergen Reactions   Atorvastatin     REACTION: myalgias   Oxybutynin     REACTION: rash with  patch     Outpatient Meds: Current Outpatient Medications  Medication Sig Dispense Refill   baclofen (LIORESAL) 10 MG tablet Take 1 tablet (10 mg total) by mouth 3 (three) times daily as needed for muscle spasms. 30 each 0   Calcium Carbonate-Vitamin D 600-400 MG-UNIT tablet Take 1 tablet by mouth daily.       Cholecalciferol (VITAMIN D-3 PO) Take 1 tablet by mouth daily.     Cyanocobalamin (B-12 PO) Take by mouth daily.     FLUoxetine (PROZAC) 20 MG capsule Take 3 capsules (60 mg total) by mouth daily. 270 capsule 3   hydrochlorothiazide (HYDRODIURIL) 25 MG tablet TAKE 1/2 TO 1 (ONE-HALF TO ONE) TABLET BY MOUTH ONCE DAILY AS NEEDED 90 tablet 3   ketoconazole (NIZORAL) 2 % cream Apply 1 application topically daily. 30 g 1   mometasone (ELOCON) 0.1 % cream   0   omeprazole (PRILOSEC) 20 MG capsule Take 1 capsule (20 mg total) by mouth daily. 90 capsule 3   oxybutynin (DITROPAN-XL) 10 MG 24 hr tablet Take 1 tablet (10 mg total) by mouth at bedtime. 90 tablet 3   rosuvastatin (CRESTOR) 10 MG tablet Take 1 tablet (10 mg total) by mouth daily. 90 tablet 3   SUMAtriptan (IMITREX) 100 MG tablet Take 1 tablet (100 mg total) by mouth every 2 (two) hours as needed for migraine. May repeat in 2 hours if headache persists or recurs. 30 tablet 3   topiramate (TOPAMAX) 50 MG tablet Take 1 tablet (50 mg total) by mouth at bedtime. 90 tablet 3   No current facility-administered medications for this visit.      ___________________________________________________________________ Objective   Exam:  BP 124/70   Pulse 96   Ht 5\' 6"  (1.676 m)   Wt 205 lb 9.6 oz (93.3 kg)   LMP 04/17/2011   SpO2 99%   BMI 33.18 kg/m  Wt Readings from Last 3 Encounters:  10/16/21 205 lb 9.6 oz (93.3 kg)   05/09/21 215 lb 6.2 oz (97.7 kg)  10/30/20 215 lb 5 oz (97.7 kg)    General: Well-appearing Eyes: sclera anicteric, no redness ENT: oral mucosa moist without lesions, no cervical or supraclavicular lymphadenopathy CV: RRR without murmur, S1/S2, no JVD, no peripheral edema Resp: clear to auscultation bilaterally, normal RR and effort noted GI: soft, no tenderness, with active bowel sounds.  No guarding or palpable organomegaly noted. Skin; warm and dry, no rash or jaundice noted Neuro: awake, alert and oriented x 3. Normal gross motor function and fluent speech DRE: Redundant external skin folds.  No fissure tenderness or palpable internal lesion. Anoscopy: Mildly enlarged RP hemorrhoidal plexus.  Labs:  Neg TTG Ab Feb 2009  CBC Latest Ref Rng & Units 10/23/2020 09/14/2020 10/20/2019  WBC 4.0 - 10.5 K/uL 4.3 5.9 4.6  Hemoglobin 12.0 - 15.0 g/dL 13.0 12.8 12.9  Hematocrit 36.0 - 46.0 % 38.7 37.8 38.5  Platelets 150.0 - 400.0 K/uL 234.0 246.0 234.0   CMP Latest Ref Rng & Units 10/23/2020 09/14/2020 10/20/2019  Glucose 70 - 99 mg/dL 96 86 108(H)  BUN 6 - 23 mg/dL 17 13 21   Creatinine 0.40 - 1.20 mg/dL 0.77 0.72 0.84  Sodium 135 - 145 mEq/L 142 140 141  Potassium 3.5 - 5.1 mEq/L 3.4(L) 4.0 3.7  Chloride 96 - 112 mEq/L 103 102 104  CO2 19 - 32 mEq/L 30 30 27   Calcium 8.4 - 10.5 mg/dL 8.9 9.2 9.0  Total Protein 6.0 - 8.3 g/dL 6.7 - 7.0  Total Bilirubin 0.2 - 1.2 mg/dL 0.5 - 0.6  Alkaline Phos 39 - 117 U/L 74 - 76  AST 0 - 37 U/L 27 - 32  ALT 0 - 35 U/L 31 - 33   Assessment: Encounter Diagnoses  Name Primary?   Rectal bleeding    Abdominal bloating Yes    Self-limited abdominal bloating lower abdominal pain without change in bowel habits (she is reported longstanding occasional constipation that has not changed in recent months). Symptoms resolved after making dietary changes, encouraged her to be sure she is getting a balanced diet, and especially with sufficient calcium to  decrease the chance of bone loss since she is dairy free.  Not clear whether the elimination of dairy or gluten and carbohydrates was the reason for improvement.  She thinks that after she loses more weight, she may slowly try to reintroduce gluten and see how things go.  Self-limited mild hemorrhoidal bleeding, mild prominence RP hemorrhoidal plexus, nothing requiring or amenable treatment such as banding at present.   Plan: No colonoscopy or further testing needed now.  She will monitor symptoms and see me again as needed  Thank you for the courtesy of this consult.  Please call me with any questions or concerns.  Nelida Meuse III  CC: Referring provider noted above

## 2021-10-24 ENCOUNTER — Ambulatory Visit: Payer: Managed Care, Other (non HMO) | Admitting: Internal Medicine

## 2021-10-31 ENCOUNTER — Encounter: Payer: Self-pay | Admitting: Family Medicine

## 2021-10-31 ENCOUNTER — Ambulatory Visit (INDEPENDENT_AMBULATORY_CARE_PROVIDER_SITE_OTHER): Payer: Managed Care, Other (non HMO) | Admitting: Family Medicine

## 2021-10-31 ENCOUNTER — Other Ambulatory Visit: Payer: Self-pay

## 2021-10-31 VITALS — BP 120/68 | HR 83 | Temp 97.6°F | Ht 66.0 in | Wt 200.0 lb

## 2021-10-31 DIAGNOSIS — E538 Deficiency of other specified B group vitamins: Secondary | ICD-10-CM | POA: Diagnosis not present

## 2021-10-31 DIAGNOSIS — Z114 Encounter for screening for human immunodeficiency virus [HIV]: Secondary | ICD-10-CM | POA: Insufficient documentation

## 2021-10-31 DIAGNOSIS — Z1159 Encounter for screening for other viral diseases: Secondary | ICD-10-CM | POA: Insufficient documentation

## 2021-10-31 DIAGNOSIS — K219 Gastro-esophageal reflux disease without esophagitis: Secondary | ICD-10-CM

## 2021-10-31 DIAGNOSIS — F418 Other specified anxiety disorders: Secondary | ICD-10-CM | POA: Diagnosis not present

## 2021-10-31 DIAGNOSIS — Z0000 Encounter for general adult medical examination without abnormal findings: Secondary | ICD-10-CM

## 2021-10-31 DIAGNOSIS — R7303 Prediabetes: Secondary | ICD-10-CM

## 2021-10-31 DIAGNOSIS — E7800 Pure hypercholesterolemia, unspecified: Secondary | ICD-10-CM

## 2021-10-31 DIAGNOSIS — E559 Vitamin D deficiency, unspecified: Secondary | ICD-10-CM

## 2021-10-31 MED ORDER — OMEPRAZOLE 20 MG PO CPDR
20.0000 mg | DELAYED_RELEASE_CAPSULE | Freq: Every day | ORAL | 3 refills | Status: DC
Start: 2021-10-31 — End: 2021-11-14

## 2021-10-31 MED ORDER — OXYBUTYNIN CHLORIDE ER 10 MG PO TB24: 10.0000 mg | ORAL_TABLET | Freq: Every day | ORAL | 3 refills | Status: DC

## 2021-10-31 MED ORDER — HYDROCHLOROTHIAZIDE 25 MG PO TABS: ORAL_TABLET | ORAL | 3 refills | Status: DC

## 2021-10-31 MED ORDER — TOPIRAMATE 50 MG PO TABS: 50.0000 mg | ORAL_TABLET | Freq: Every day | ORAL | 3 refills | Status: DC

## 2021-10-31 MED ORDER — SUMATRIPTAN 20 MG/ACT NA SOLN: 20.0000 mg | NASAL | 11 refills | Status: DC | PRN

## 2021-10-31 MED ORDER — ROSUVASTATIN CALCIUM 10 MG PO TABS: 10.0000 mg | ORAL_TABLET | Freq: Every day | ORAL | 3 refills | Status: DC

## 2021-10-31 NOTE — Progress Notes (Signed)
Subjective:    Patient ID: Pamela Anthony, female    DOB: August 23, 1956, 65 y.o.   MRN: [id number]  This visit occurred during the SARS-CoV-2 public health emergency.  Safety protocols were in place, including screening questions prior to the visit, additional usage of staff PPE, and extensive cleaning of exam room while observing appropriate contact time as indicated for disinfecting solutions.   HPI Here for health maintenance exam and to review chronic medical problems    Wt Readings from Last 3 Encounters:  10/31/21 200 lb (90.7 kg)  10/16/21 205 lb 9.6 oz (93.3 kg)  05/09/21 215 lb 6.2 oz (97.7 kg)   32.28 kg/m  Doing pretty well  Started a wt loss program in October  Highest wt was 220 Dairy and gluten and sugar free ([facility]) Dairy may have caused her joint pain she thinks  Also headaches are improved  Lost 20 lb so far  Eating lean protein and fruit/veg   Covid vaccinated Tdap 08/2014 Flu shot 08/2021 Shingrix -has had both Pna vaccine 10/2019  Mammogram 09/2021 Self breast exam -no breast lumps   Pap 10/2020, negative  No gyn problems   Colonoscopy 04/2018 -recall 10y She just saw Dr Loletha Carrow (had some rectal bleeding) Everything was ok    BP Readings from Last 3 Encounters:  10/31/21 120/68  10/16/21 124/70  05/09/21 117/69   Pulse Readings from Last 3 Encounters:  10/31/21 83  10/16/21 96  05/09/21 75    GERD Takes omeprazole   Nl dexa 2013  Mood/history of depression and anxiety  Doing well overall  Taking care of herself  Work is still stressful  Would like to retire at end of 2024   Walking for exercise   Hyperlipidemia  Lab Results  Component Value Date   CHOL 223 (H) 10/23/2020   HDL 43.50 10/23/2020   LDLCALC 151 (H) 10/23/2020   LDLDIRECT 182.2 08/25/2013   TRIG 142.0 10/23/2020   CHOLHDL 5 10/23/2020   Due for labs  Back on crestor 10 mg daily No fried foods   [place]ound [place] and chicken breast and tuna  primarily  Less beef    B12 def  Lab Results  Component Value Date   VITAMINB12 1,125 (H) 10/23/2020   Due for labs-held oral supp for 1 mo then changed to every other day   Prediabetes Lab Results  Component Value Date   HGBA1C 5.7 10/23/2020  Eating less sugar now-should be better   Patient Active Problem List   Diagnosis Date Noted   Encounter for screening for HIV 10/31/2021   Encounter for hepatitis C screening test for low risk patient 10/31/2021   Elevated antinuclear antibody (ANA) level 09/20/2020   Candidal intertrigo 04/28/2020   Colon cancer screening 03/18/2018   Prediabetes 03/16/2015   Encounter for routine gynecological examination 09/01/2013   Depression with anxiety 01/18/2013   Post-menopausal 08/14/2012   Other screening mammogram 04/29/2011   Routine general medical examination at a health care facility 04/22/2011   Joint pain 02/28/2010   Myofascial pain syndrome 02/28/2010   Vitamin D deficiency 03/08/2009   IRRITABLE BOWEL SYNDROME 06/27/2008   B12 deficiency 02/23/2008   POLYP, COLON 03/06/2007   Hyperlipidemia 03/06/2007   GERD 03/06/2007   HIATAL HERNIA 03/06/2007   OSA (obstructive sleep apnea) 03/06/2007   URINARY INCONTINENCE 03/06/2007   Migraine 03/06/2007   Past Medical History:  Diagnosis Date   Allergic rhinitis    Allergy    Anxiety  B12 deficiency    Degenerative disc disease    back   Fungal infection 2005   lamasil    GERD (gastroesophageal reflux disease)    EGD 10/2004   Hemorrhoids, internal    colonoscopy 02/2008   Hyperlipemia    Iron deficiency anemia    Low back pain    Obesity    Sleep apnea    wears cpap   Urinary incontinence    Vitamin D deficiency    mild   Past Surgical History:  Procedure Laterality Date   [place] OF UTERUS  2005   HAND SURGERY  2000   nodule removed   KNEE SURGERY  1970's   right   NASAL SINUS SURGERY  2000's    right maxillary   TONSILLECTOMY AND ADENOIDECTOMY     Social History   Tobacco Use   Smoking status: Never   Smokeless tobacco: Never  Vaping Use   Vaping Use: Never used  Substance Use Topics   Alcohol use: Yes    Alcohol/week: 0.0 standard drinks    Comment: occasional   Drug use: No   Family History  Problem Relation Age of Onset   Coronary artery disease Father        in his 18's   Arthritis Father    Stroke Father    Heart disease Father        pacemaker   Prostate cancer Father    Breast cancer Maternal Grandmother    Colon cancer Other    Breast cancer Cousin        mat cousin   Colon polyps Neg Hx    Allergies  Allergen Reactions   Atorvastatin     REACTION: myalgias   Oxybutynin     REACTION: rash with  patch    Current Outpatient Medications on File Prior to Visit  Medication Sig Dispense Refill   Cholecalciferol (VITAMIN D-3 PO) Take 1 tablet by mouth daily.     Cyanocobalamin (B-12 PO) Take by mouth daily.     FLUoxetine (PROZAC) 20 MG capsule Take 3 capsules (60 mg total) by mouth daily. 270 capsule 3   ketoconazole (NIZORAL) 2 % cream Apply 1 application topically daily. 30 g 1   mometasone (ELOCON) 0.1 % cream   0   No current facility-administered medications on file prior to visit.    Review of Systems  Constitutional:  Negative for activity change, appetite change, fatigue, fever and unexpected weight change.  HENT:  Negative for congestion, ear pain, rhinorrhea, sinus pressure and sore throat.   Eyes:  Negative for pain, redness and visual disturbance.  Respiratory:  Negative for cough, shortness of breath and wheezing.   Cardiovascular:  Negative for chest pain and palpitations.  Gastrointestinal:  Negative for abdominal pain, blood in stool, constipation and diarrhea.  Endocrine: Negative for polydipsia and polyuria.  Genitourinary:  Negative for dysuria, frequency and urgency.  Musculoskeletal:  Negative for arthralgias, back pain and  myalgias.       Myofascial pain is improved lately  Skin:  Negative for pallor and rash.  Allergic/Immunologic: Negative for environmental allergies.  Neurological:  Negative for dizziness, syncope and headaches.  Hematological:  Negative for adenopathy. Does not bruise/bleed easily.  Psychiatric/Behavioral:  Negative for decreased concentration and dysphoric mood. The patient is not nervous/anxious.       Objective:   Physical Exam Constitutional:      General:  She is not in acute distress.    Appearance: Normal appearance. She is well-developed. She is obese. She is not ill-appearing or diaphoretic.  HENT:     Head: Normocephalic and atraumatic.     Right Ear: Tympanic membrane, ear canal and external ear normal.     Left Ear: Tympanic membrane, ear canal and external ear normal.     Nose: Nose normal. No congestion.     Mouth/Throat:     Mouth: Mucous membranes are moist.     Pharynx: Oropharynx is clear. No posterior oropharyngeal erythema.  Eyes:     General: No scleral icterus.    Extraocular Movements: Extraocular movements intact.     Conjunctiva/sclera: Conjunctivae normal.     Pupils: Pupils are equal, round, and reactive to light.  Neck:     Thyroid: No thyromegaly.     Vascular: No carotid bruit or JVD.  Cardiovascular:     Rate and Rhythm: Normal rate and regular rhythm.     Pulses: Normal pulses.     Heart sounds: Normal heart sounds.    No gallop.  Pulmonary:     Effort: Pulmonary effort is normal. No respiratory distress.     Breath sounds: Normal breath sounds. No wheezing.     Comments: Good air exch Chest:     Chest wall: No tenderness.  Abdominal:     General: Bowel sounds are normal. There is no distension or abdominal bruit.     Palpations: Abdomen is soft. There is no mass.     Tenderness: There is no abdominal tenderness.     Hernia: No hernia is present.  Genitourinary:    Comments: Breast exam: No mass, nodules, thickening, tenderness, bulging,  retraction, inflamation, nipple discharge or skin changes noted.  No axillary or clavicular LA.     Musculoskeletal:        General: No tenderness. Normal range of motion.     Cervical back: Normal range of motion and neck supple. No rigidity. No muscular tenderness.     Right lower leg: No edema.     Left lower leg: No edema.     Comments: No kyphosis  No acute joint changes   Lymphadenopathy:     Cervical: No cervical adenopathy.  Skin:    General: Skin is warm and dry.     Coloration: Skin is not pale.     Findings: No erythema or rash.     Comments: Solar lentigines diffusely   Neurological:     Mental Status: She is alert. Mental status is at baseline.     Cranial Nerves: No cranial nerve deficit.     Motor: No abnormal muscle tone.     Coordination: Coordination normal.     Gait: Gait normal.     Deep Tendon Reflexes: Reflexes are normal and symmetric. Reflexes normal.  Psychiatric:        Mood and Affect: Mood normal.        Cognition and Memory: Cognition and memory normal.          Assessment & Plan:   Problem List Items Addressed This Visit       Digestive   GERD   Relevant Medications   omeprazole (PRILOSEC) 20 MG capsule     Other   B12 deficiency    Takes orally  Level ordered      Relevant Orders   Vitamin B12 (Completed)   Vitamin D deficiency    Oral supplementation otc  Level ordered  Relevant Orders   VITAMIN D 25 Hydroxy (Vit-D Deficiency, Fractures) (Completed)   Hyperlipidemia    Disc goals for lipids and reasons to control them Rev last labs with pt Rev low sat fat diet in detail Lipid panel ordered  Taking crestor 10 mg daily  Recently diet has been better      Relevant Medications   hydrochlorothiazide (HYDRODIURIL) 25 MG tablet   rosuvastatin (CRESTOR) 10 MG tablet   Other Relevant Orders   Lipid panel (Completed)   Routine general medical examination at a health care facility - Primary    Reviewed health habits  including diet and exercise and skin cancer prevention Reviewed appropriate screening tests for age  Also reviewed health mt list, fam hx and immunization status , as well as social and family history   See HPI Labs ordered Commended on wt loss and better diet  utd imms utd mammogram  utd pap  utd colonoscopy          Relevant Orders   CBC with Differential/Platelet (Completed)   Comprehensive metabolic panel (Completed)   Lipid panel (Completed)   TSH (Completed)   Depression with anxiety    Doing better recently with better diet Continues fluoxetine 60 mg which works well for her      Prediabetes    A1C ordered Better diet and wt loss, commended  disc imp of low glycemic diet and wt loss to prevent DM2       Relevant Orders   Hemoglobin A1c (Completed)   Encounter for screening for HIV    HIV ordered for low risk screening      Relevant Orders   HIV Antibody (routine testing w rflx) (Completed)   Encounter for hepatitis C screening test for low risk patient    Hep C ordered for low risk screening      Relevant Orders   Hepatitis C antibody (Completed)

## 2021-10-31 NOTE — Patient Instructions (Addendum)
Keep up the good work   When you can- increase exercise  Labs today

## 2021-11-02 LAB — COMPREHENSIVE METABOLIC PANEL
AG Ratio: 2 (calc) (ref 1.0–2.5)
ALT: 21 U/L (ref 6–29)
AST: 22 U/L (ref 10–35)
Albumin: 4.3 g/dL (ref 3.6–5.1)
Alkaline phosphatase (APISO): 69 U/L (ref 37–153)
BUN: 12 mg/dL (ref 7–25)
CO2: 28 mmol/L (ref 20–32)
Calcium: 9.5 mg/dL (ref 8.6–10.4)
Chloride: 100 mmol/L (ref 98–110)
Creat: 0.63 mg/dL (ref 0.50–1.05)
Globulin: 2.1 g/dL (calc) (ref 1.9–3.7)
Glucose, Bld: 81 mg/dL (ref 65–99)
Potassium: 3.7 mmol/L (ref 3.5–5.3)
Sodium: 138 mmol/L (ref 135–146)
Total Bilirubin: 0.8 mg/dL (ref 0.2–1.2)
Total Protein: 6.4 g/dL (ref 6.1–8.1)

## 2021-11-02 LAB — HEPATITIS C ANTIBODY
Hepatitis C Ab: NONREACTIVE
SIGNAL TO CUT-OFF: 0.04 (ref <1.00)

## 2021-11-02 LAB — CBC WITH DIFFERENTIAL/PLATELET
Absolute Monocytes: 307 cells/uL (ref 200–950)
Basophils Absolute: 29 cells/uL (ref 0–200)
Basophils Relative: 0.7 %
Eosinophils Absolute: 38 cells/uL (ref 15–500)
Eosinophils Relative: 0.9 %
HCT: 39 % (ref 35.0–45.0)
Hemoglobin: 12.9 g/dL (ref 11.7–15.5)
Lymphs Abs: 924 cells/uL (ref 850–3900)
MCH: 29.3 pg (ref 27.0–33.0)
MCHC: 33.1 g/dL (ref 32.0–36.0)
MCV: 88.6 fL (ref 80.0–100.0)
MPV: 11.4 fL (ref 7.5–12.5)
Monocytes Relative: 7.3 %
Neutro Abs: 2902 cells/uL (ref 1500–7800)
Neutrophils Relative %: 69.1 %
Platelets: 239 10*3/uL (ref 140–400)
RBC: 4.4 10*6/uL (ref 3.80–5.10)
RDW: 13.9 % (ref 11.0–15.0)
Total Lymphocyte: 22 %
WBC: 4.2 10*3/uL (ref 3.8–10.8)

## 2021-11-02 LAB — LIPID PANEL
Cholesterol: 113 mg/dL (ref <200)
HDL: 39 mg/dL — ABNORMAL LOW (ref >=50)
LDL Cholesterol (Calc): 59 mg/dL (calc)
Non-HDL Cholesterol (Calc): 74 mg/dL (calc) (ref <130)
Total CHOL/HDL Ratio: 2.9 (calc) (ref <5.0)
Triglycerides: 73 mg/dL (ref <150)

## 2021-11-02 LAB — VITAMIN B12: Vitamin B-12: 1190 pg/mL — ABNORMAL HIGH (ref 200–1100)

## 2021-11-02 LAB — TSH: TSH: 1.82 mIU/L (ref 0.40–4.50)

## 2021-11-02 LAB — HEMOGLOBIN A1C
Hgb A1c MFr Bld: 4.7 % of total Hgb (ref <5.7)
Mean Plasma Glucose: 88 mg/dL
eAG (mmol/L): 4.9 mmol/L

## 2021-11-02 LAB — HIV ANTIBODY (ROUTINE TESTING W REFLEX): HIV 1&2 Ab, 4th Generation: NONREACTIVE

## 2021-11-02 LAB — VITAMIN D 25 HYDROXY (VIT D DEFICIENCY, FRACTURES): Vit D, 25-Hydroxy: 46 ng/mL (ref 30–100)

## 2021-11-02 NOTE — Assessment & Plan Note (Signed)
Reviewed health habits including diet and exercise and skin cancer prevention Reviewed appropriate screening tests for age  Also reviewed health mt list, fam hx and immunization status , as well as social and family history   See HPI Labs ordered Commended on wt loss and better diet  utd imms utd mammogram  utd pap  utd colonoscopy

## 2021-11-02 NOTE — Assessment & Plan Note (Signed)
Oral supplementation otc  Level ordered

## 2021-11-02 NOTE — Assessment & Plan Note (Signed)
Takes orally  Level ordered

## 2021-11-02 NOTE — Assessment & Plan Note (Signed)
HIV ordered for low risk screening

## 2021-11-02 NOTE — Assessment & Plan Note (Signed)
A1C ordered Better diet and wt loss, commended  disc imp of low glycemic diet and wt loss to prevent DM2

## 2021-11-02 NOTE — Assessment & Plan Note (Signed)
Doing better recently with better diet Continues fluoxetine 60 mg which works well for her

## 2021-11-02 NOTE — Assessment & Plan Note (Signed)
Hep C ordered for low risk screening

## 2021-11-02 NOTE — Assessment & Plan Note (Signed)
Disc goals for lipids and reasons to control them Rev last labs with pt Rev low sat fat diet in detail Lipid panel ordered  Taking crestor 10 mg daily  Recently diet has been better

## 2021-11-07 ENCOUNTER — Other Ambulatory Visit: Payer: Self-pay | Admitting: *Deleted

## 2021-11-07 MED ORDER — OXYBUTYNIN CHLORIDE ER 10 MG PO TB24: 10.0000 mg | ORAL_TABLET | Freq: Every day | ORAL | 3 refills | Status: DC

## 2021-11-07 MED ORDER — TOPIRAMATE 50 MG PO TABS: 50.0000 mg | ORAL_TABLET | Freq: Every day | ORAL | 3 refills | Status: DC

## 2021-11-07 MED ORDER — ROSUVASTATIN CALCIUM 10 MG PO TABS: 10.0000 mg | ORAL_TABLET | Freq: Every day | ORAL | 3 refills | Status: DC

## 2021-11-07 NOTE — Addendum Note (Signed)
Addended by: Tammi Sou on: 11/07/2021 03:22 PM   Modules accepted: Orders

## 2021-11-07 NOTE — Addendum Note (Signed)
Addended by: Tammi Sou on: 11/07/2021 03:25 PM   Modules accepted: Orders

## 2021-11-14 ENCOUNTER — Telehealth: Payer: Self-pay | Admitting: Family Medicine

## 2021-11-14 MED ORDER — HYDROCHLOROTHIAZIDE 25 MG PO TABS
ORAL_TABLET | ORAL | 3 refills | Status: DC
Start: 2021-11-14 — End: 2022-02-11

## 2021-11-14 MED ORDER — OMEPRAZOLE 20 MG PO CPDR: 20.0000 mg | DELAYED_RELEASE_CAPSULE | Freq: Every day | ORAL | 3 refills | Status: DC

## 2021-11-14 NOTE — Telephone Encounter (Signed)
[facility] called stated need's approval for Rx omeprazole (PRILOSEC) 20 MG capsule and  hydrochlorothiazide (HYDRODIURIL) 25 MG tablet  # Phone:  [phone]    Fax:  [phone]

## 2022-02-06 ENCOUNTER — Encounter: Payer: Self-pay | Admitting: Family Medicine

## 2022-02-11 ENCOUNTER — Telehealth: Payer: Self-pay | Admitting: Family Medicine

## 2022-02-11 MED ORDER — OXYBUTYNIN CHLORIDE ER 10 MG PO TB24: 10.0000 mg | ORAL_TABLET | Freq: Every day | ORAL | 3 refills | Status: DC

## 2022-02-11 MED ORDER — ROSUVASTATIN CALCIUM 10 MG PO TABS: 10.0000 mg | ORAL_TABLET | Freq: Every day | ORAL | 3 refills | Status: DC

## 2022-02-11 MED ORDER — OMEPRAZOLE 20 MG PO CPDR: 20.0000 mg | DELAYED_RELEASE_CAPSULE | Freq: Every day | ORAL | 3 refills | Status: DC

## 2022-02-11 MED ORDER — HYDROCHLOROTHIAZIDE 25 MG PO TABS: ORAL_TABLET | ORAL | 3 refills | Status: DC

## 2022-02-11 MED ORDER — LORATADINE 10 MG PO TABS: 10.0000 mg | ORAL_TABLET | Freq: Every day | ORAL | 3 refills | Status: DC

## 2022-02-11 MED ORDER — FLUOXETINE HCL 20 MG PO CAPS: 60.0000 mg | ORAL_CAPSULE | Freq: Every day | ORAL | 3 refills | Status: DC

## 2022-02-11 MED ORDER — TOPIRAMATE 50 MG PO TABS: 50.0000 mg | ORAL_TABLET | Freq: Every day | ORAL | 3 refills | Status: DC

## 2022-02-11 MED ORDER — FLUTICASONE PROPIONATE 50 MCG/ACT NA SUSP: 2.0000 | Freq: Every day | NASAL | 6 refills | Status: DC

## 2022-02-11 NOTE — Telephone Encounter (Signed)
See mychart message, responded NW:[id number] ?

## 2022-02-11 NOTE — Telephone Encounter (Signed)
Pt called stating that she would like for all her medication going to [facility]. Pt is also asking for a call back to discuss other medication changes. Please advise. ?

## 2022-02-26 DIAGNOSIS — H40003 Preglaucoma, unspecified, bilateral: Secondary | ICD-10-CM | POA: Diagnosis not present

## 2022-02-26 DIAGNOSIS — H524 Presbyopia: Secondary | ICD-10-CM | POA: Diagnosis not present

## 2022-03-07 DIAGNOSIS — H2512 Age-related nuclear cataract, left eye: Secondary | ICD-10-CM | POA: Diagnosis not present

## 2022-03-07 DIAGNOSIS — H524 Presbyopia: Secondary | ICD-10-CM | POA: Diagnosis not present

## 2022-03-18 ENCOUNTER — Encounter: Payer: Self-pay | Admitting: Ophthalmology

## 2022-03-21 NOTE — Discharge Instructions (Signed)

## 2022-03-25 ENCOUNTER — Encounter: Payer: Self-pay | Admitting: Ophthalmology

## 2022-03-25 ENCOUNTER — Ambulatory Visit: Payer: Medicare HMO | Admitting: Anesthesiology

## 2022-03-25 ENCOUNTER — Encounter
Admission: RE | Disposition: A | Discharge status: Home / Self Care | Payer: Self-pay | Source: Home / Self Care | Attending: Ophthalmology

## 2022-03-25 ENCOUNTER — Other Ambulatory Visit: Payer: Self-pay

## 2022-03-25 ENCOUNTER — Ambulatory Visit
Admission: RE | Admit: 2022-03-25 | Discharge: 2022-03-25 | Disposition: A | Discharge status: Home / Self Care | Payer: Medicare HMO | Attending: Ophthalmology | Admitting: Ophthalmology

## 2022-03-25 DIAGNOSIS — K219 Gastro-esophageal reflux disease without esophagitis: Secondary | ICD-10-CM | POA: Insufficient documentation

## 2022-03-25 DIAGNOSIS — H2512 Age-related nuclear cataract, left eye: Secondary | ICD-10-CM | POA: Insufficient documentation

## 2022-03-25 DIAGNOSIS — M1990 Unspecified osteoarthritis, unspecified site: Secondary | ICD-10-CM | POA: Diagnosis not present

## 2022-03-25 DIAGNOSIS — K589 Irritable bowel syndrome without diarrhea: Secondary | ICD-10-CM | POA: Diagnosis not present

## 2022-03-25 DIAGNOSIS — G4730 Sleep apnea, unspecified: Secondary | ICD-10-CM | POA: Insufficient documentation

## 2022-03-25 DIAGNOSIS — H25812 Combined forms of age-related cataract, left eye: Secondary | ICD-10-CM | POA: Diagnosis not present

## 2022-03-25 DIAGNOSIS — F419 Anxiety disorder, unspecified: Secondary | ICD-10-CM | POA: Diagnosis not present

## 2022-03-25 HISTORY — DX: Migraine, unspecified, not intractable, without status migrainosus: G43.909

## 2022-03-25 HISTORY — PX: CATARACT EXTRACTION W/PHACO: SHX586

## 2022-03-25 HISTORY — DX: Syncope and collapse: R55

## 2022-03-25 HISTORY — DX: Family history of other specified conditions: Z84.89

## 2022-03-25 HISTORY — DX: Reserved for inherently not codable concepts without codable children: IMO0001

## 2022-03-25 HISTORY — DX: Encounter for fitting and adjustment of orthodontic device: Z46.4

## 2022-03-25 SURGERY — PHACOEMULSIFICATION, CATARACT, WITH IOL INSERTION
Anesthesia: Monitor Anesthesia Care | Site: Eye | Laterality: Left

## 2022-03-25 MED ORDER — ONDANSETRON HCL 4 MG/2ML IJ SOLN
4.0000 mg | Freq: Once | INTRAMUSCULAR | Status: AC
  Administered 2022-03-25: 4 mg via INTRAVENOUS

## 2022-03-25 MED ORDER — SIGHTPATH DOSE#1 BSS IO SOLN
INTRAOCULAR | Status: DC | PRN
  Administered 2022-03-25: 15 mL

## 2022-03-25 MED ORDER — OXYCODONE HCL 5 MG PO TABS: 5.0000 mg | ORAL_TABLET | Freq: Once | ORAL | Status: DC | PRN

## 2022-03-25 MED ORDER — OXYCODONE HCL 5 MG/5ML PO SOLN: 5.0000 mg | Freq: Once | ORAL | Status: DC | PRN

## 2022-03-25 MED ORDER — TETRACAINE HCL 0.5 % OP SOLN
1.0000 [drp] | OPHTHALMIC | Status: DC | PRN
  Administered 2022-03-25 (×3): 1 [drp] via OPHTHALMIC

## 2022-03-25 MED ORDER — LACTATED RINGERS IV SOLN: INTRAVENOUS | Status: DC

## 2022-03-25 MED ORDER — BRIMONIDINE TARTRATE-TIMOLOL 0.2-0.5 % OP SOLN
OPHTHALMIC | Status: DC | PRN
  Administered 2022-03-25: 1 [drp] via OPHTHALMIC

## 2022-03-25 MED ORDER — ARMC OPHTHALMIC DILATING DROPS
1.0000 | OPHTHALMIC | Status: DC | PRN
Start: 2022-03-25 — End: 2022-03-25
  Administered 2022-03-25 (×3): 1 via OPHTHALMIC

## 2022-03-25 MED ORDER — CEFUROXIME OPHTHALMIC INJECTION 1 MG/0.1 ML
INJECTION | OPHTHALMIC | Status: DC | PRN
  Administered 2022-03-25: 0.1 mL via INTRACAMERAL

## 2022-03-25 MED ORDER — SIGHTPATH DOSE#1 BSS IO SOLN
INTRAOCULAR | Status: DC | PRN
  Administered 2022-03-25: 67 mL via OPHTHALMIC

## 2022-03-25 MED ORDER — MIDAZOLAM HCL 2 MG/2ML IJ SOLN
INTRAMUSCULAR | Status: DC | PRN
Start: 2022-03-25 — End: 2022-03-25
  Administered 2022-03-25: 2 mg via INTRAVENOUS

## 2022-03-25 MED ORDER — SIGHTPATH DOSE#1 BSS IO SOLN
INTRAOCULAR | Status: DC | PRN
  Administered 2022-03-25: 1 mL

## 2022-03-25 MED ORDER — FENTANYL CITRATE (PF) 100 MCG/2ML IJ SOLN
INTRAMUSCULAR | Status: DC | PRN
Start: 2022-03-25 — End: 2022-03-25
  Administered 2022-03-25 (×2): 50 ug via INTRAVENOUS

## 2022-03-25 MED ORDER — SIGHTPATH DOSE#1 NA HYALUR & NA CHOND-NA HYALUR IO KIT
PACK | INTRAOCULAR | Status: DC | PRN
Start: 2022-03-25 — End: 2022-03-25
  Administered 2022-03-25: 1 via OPHTHALMIC

## 2022-03-25 MED ORDER — GLYCOPYRROLATE 0.2 MG/ML IJ SOLN
0.2000 mg | Freq: Once | INTRAMUSCULAR | Status: AC
  Administered 2022-03-25: 0.2 mg via INTRAVENOUS

## 2022-03-25 SURGICAL SUPPLY — 21 items
CANNULA ANT/CHMB 27G (MISCELLANEOUS) IMPLANT
CANNULA ANT/CHMB 27GA (MISCELLANEOUS) IMPLANT
CATARACT SUITE SIGHTPATH (MISCELLANEOUS) ×2 IMPLANT
FEE CATARACT SUITE SIGHTPATH (MISCELLANEOUS) ×1 IMPLANT
GLOVE SRG 8 PF TXTR STRL LF DI (GLOVE) ×1 IMPLANT
GLOVE SURG ENC TEXT LTX SZ7.5 (GLOVE) ×2 IMPLANT
GLOVE SURG GAMMEX PI TX LF 7.5 (GLOVE) IMPLANT
GLOVE SURG UNDER POLY LF SZ8 (GLOVE) ×2
LENS IOL TECNIS EYHANCE 19.5 (Intraocular Lens) ×1 IMPLANT
NDL FILTER BLUNT 18X1 1/2 (NEEDLE) ×1 IMPLANT
NDL RETROBULBAR .5 NSTRL (NEEDLE) IMPLANT
NEEDLE FILTER BLUNT 18X 1/2SAF (NEEDLE) ×1
NEEDLE FILTER BLUNT 18X1 1/2 (NEEDLE) ×1 IMPLANT
PACK VIT ANT 23G (MISCELLANEOUS) IMPLANT
RING MALYGIN 7.0 (MISCELLANEOUS) IMPLANT
SUT ETHILON 10-0 CS-B-6CS-B-6 (SUTURE)
SUT VICRYL  9 0 (SUTURE)
SUT VICRYL 9 0 (SUTURE) IMPLANT
SUTURE EHLN 10-0 CS-B-6CS-B-6 (SUTURE) IMPLANT
SYR 3ML LL SCALE MARK (SYRINGE) ×2 IMPLANT
WATER STERILE IRR 250ML POUR (IV SOLUTION) ×2 IMPLANT

## 2022-03-25 NOTE — Progress Notes (Signed)
After administering tetracaine eye drop, pt complained that she felt like she was going to pass out. Pt placed in supine position. BP 81/51, HR 35, SB, oxygen sat 100%. Dr. Denton Lank, anesthesiologist, called to bedside. Verbal order received for 400 ml LR bolus, glycopyrrolate 0.2 mg and zofran 4 mg. IV placed in left hand and medications and fluids administered as ordered. Repeat BP at 0719 77/49 (HR 42, SB) and 95/58 (HR 61, NSR) at 0721. Pt reports feeling "better." ?

## 2022-03-25 NOTE — Transfer of Care (Signed)
Immediate Anesthesia Transfer of Care Note ? ?Patient: Pamela Anthony ? ?Procedure(s) Performed: CATARACT EXTRACTION PHACO AND INTRAOCULAR LENS PLACEMENT (IOC) LEFT (Left: Eye) ? ?Patient Location: PACU ? ?Anesthesia Type: MAC ? ?Level of Consciousness: awake, alert  and patient cooperative ? ?Airway and Oxygen Therapy: Patient Spontanous Breathing and Patient connected to supplemental oxygen ? ?Post-op Assessment: Post-op Vital signs reviewed, Patient's Cardiovascular Status Stable, Respiratory Function Stable, Patent Airway and No signs of Nausea or vomiting ? ?Post-op Vital Signs: Reviewed and stable ? ?Complications: No notable events documented. ? ?

## 2022-03-25 NOTE — Anesthesia Procedure Notes (Signed)
Procedure Name: [place] ?Date/Time: 03/25/2022 8:44 AM ?Performed by: Jeannene Patella, CRNA ?Pre-anesthesia Checklist: Patient identified, Emergency Drugs available, Suction available, Timeout performed and Patient being monitored ?Patient Re-evaluated:Patient Re-evaluated prior to induction ?Oxygen Delivery Method: Nasal cannula ?Placement Confirmation: positive ETCO2 ? ? ? ? ?

## 2022-03-25 NOTE — H&P (Signed)
?[facility]  ? ?Primary Care Physician:  Tower, Wynelle Fanny, MD ?Ophthalmologist: Dr. Leandrew Koyanagi ? ?Pre-Procedure History & Physical: ?HPI:  Pamela Anthony is a 66 y.o. female here for ophthalmic surgery. ?  ?Past Medical History:  ?Diagnosis Date  ? Allergic rhinitis   ? Allergy   ? Anxiety   ? B12 deficiency   ? Degenerative disc disease   ? back, knee  ? Family history of adverse reaction to anesthesia   ? Fungal infection 2005  ? lamasil   ? GERD (gastroesophageal reflux disease)   ? EGD 10/2004  ? Hemorrhoids, internal   ? colonoscopy 02/2008  ? Hyperlipemia   ? Iron deficiency anemia   ? Low back pain   ? Migraine headache   ? improved with medications  ? Obesity   ? Orthodontics   ? InvisAlign  ? Sleep apnea   ? no cpap  ? Urinary incontinence   ? Vitamin D deficiency   ? mild  ? ? ?Past Surgical History:  ?Procedure Laterality Date  ? [place]  ? COLONOSCOPY    ? DILATION AND CURETTAGE OF UTERUS  2005  ? HAND SURGERY  2000  ? nodule removed  ? KNEE SURGERY  1970's  ? right  ? NASAL SINUS SURGERY  2000's  ? right maxillary  ? TONSILLECTOMY AND ADENOIDECTOMY    ? ? ?Prior to Admission medications   ?Medication Sig Start Date End Date Taking? Authorizing Provider  ?Cholecalciferol (VITAMIN D-3 PO) Take 1 tablet by mouth daily.   Yes [provider]  ?Cyanocobalamin (B-12 PO) Take by mouth daily.   Yes [provider]  ?FLUoxetine (PROZAC) 20 MG capsule Take 3 capsules (60 mg total) by mouth daily. ?Patient taking differently: Take 40 mg by mouth daily. 02/11/22  Yes Tower, Wynelle Fanny, MD  ?fluticasone (FLONASE) 50 MCG/ACT nasal spray Place 2 sprays into both nostrils daily. 02/11/22  Yes Tower, Wynelle Fanny, MD  ?hydrochlorothiazide (HYDRODIURIL) 25 MG tablet TAKE 1/2 TO 1 (ONE-HALF TO ONE) TABLET BY MOUTH ONCE DAILY AS NEEDED 02/11/22  Yes Tower, Wynelle Fanny, MD  ?loratadine (CLARITIN) 10 MG tablet Take 1 tablet (10 mg total) by mouth daily. 02/11/22  Yes Tower, Wynelle Fanny, MD   ?mometasone (ELOCON) 0.1 % cream  03/11/18  Yes [provider]  ?omeprazole (PRILOSEC) 20 MG capsule Take 1 capsule (20 mg total) by mouth daily. 02/11/22  Yes Tower, Wynelle Fanny, MD  ?oxybutynin (DITROPAN-XL) 10 MG 24 hr tablet Take 1 tablet (10 mg total) by mouth at bedtime. 02/11/22  Yes Tower, Wynelle Fanny, MD  ?Probiotic Product (ALIGN PO) Take by mouth daily.   Yes [provider]  ?rosuvastatin (CRESTOR) 10 MG tablet Take 1 tablet (10 mg total) by mouth daily. 02/11/22  Yes Tower, Wynelle Fanny, MD  ?SUMAtriptan (IMITREX) 20 MG/ACT nasal spray Place 1 spray (20 mg total) into the nose every 2 (two) hours as needed for migraine or headache. May repeat in 2 hours if headache persists or recurs. 10/31/21  Yes Tower, Wynelle Fanny, MD  ?topiramate (TOPAMAX) 50 MG tablet Take 1 tablet (50 mg total) by mouth at bedtime. 02/11/22  Yes Tower, Wynelle Fanny, MD  ?ketoconazole (NIZORAL) 2 % cream Apply 1 application topically daily. 04/28/20   Tower, Wynelle Fanny, MD  ? ? ?Allergies as of 02/28/2022 - Review Complete 10/31/2021  ?Allergen Reaction Noted  ? Atorvastatin  07/14/2007  ? Oxybutynin  07/14/2007  ? ? ?Family History  ?  Problem Relation Age of Onset  ? Coronary artery disease Father   ?     in his 63's  ? Arthritis Father   ? Stroke Father   ? Heart disease Father   ?     pacemaker  ? Prostate cancer Father   ? Breast cancer Maternal Grandmother   ? Colon cancer Other   ? Breast cancer Cousin   ?     mat cousin  ? Colon polyps Neg Hx   ? ? ?Social History  ? ?Socioeconomic History  ? Marital status: Married  ?  Spouse name: Not on file  ? Number of children: 2  ? Years of education: Not on file  ? Highest education level: Not on file  ?Occupational History  ? Occupation: customer service  ?Tobacco Use  ? Smoking status: Never  ? Smokeless tobacco: Never  ?Vaping Use  ? Vaping Use: Never used  ?Substance and Sexual Activity  ? Alcohol use: Yes  ?  Alcohol/week: 0.0 standard drinks  ?  Comment: occasional  ? Drug use: No  ? Sexual  activity: Not on file  ?Other Topics Concern  ? Not on file  ?Social History Narrative  ? Not on file  ? ?Social Determinants of Health  ? ?Financial Resource Strain: Not on file  ?Food Insecurity: Not on file  ?Transportation Needs: Not on file  ?Physical Activity: Not on file  ?Stress: Not on file  ?Social Connections: Not on file  ?Intimate Partner Violence: Not on file  ? ? ?Review of Systems: ?See HPI, otherwise negative ROS ? ?Physical Exam: ?BP 91/69   Pulse 72   Temp 97.7 ?F (36.5 ?C) (Temporal)   Resp 18   Ht '5\' 6"'$  (1.676 m)   Wt 76.4 kg   LMP 04/17/2011   SpO2 100%   BMI 27.20 kg/m?  ?General:   Alert,  pleasant and cooperative in NAD ?Head:  Normocephalic and atraumatic. ?Lungs:  Clear to auscultation.    ?Heart:  Regular rate and rhythm.  ? ?Impression/Plan: ?Verle Strebeck is here for ophthalmic surgery. ? ?Risks, benefits, limitations, and alternatives regarding ophthalmic surgery have been reviewed with the patient.  Questions have been answered.  All parties agreeable. ? ? Leandrew Koyanagi, MD  03/25/2022, 7:42 AM ? ?

## 2022-03-25 NOTE — Anesthesia Preprocedure Evaluation (Addendum)
Anesthesia Evaluation  ?Patient identified by MRN, date of birth, ID band ?Patient awake ? ? ? ?Reviewed: ?NPO status  ? ?History of Anesthesia Complications ?Negative for: history of anesthetic complications ? ?Airway ?Mallampati: II ? ?TM Distance: >3 FB ?Neck ROM: full ? ? ? Dental ?no notable dental hx. ? ?  ?Pulmonary ?sleep apnea (no cpap) ,  ?  ?Pulmonary exam normal ? ? ? ? ? ? ? Cardiovascular ?Exercise Tolerance: Good ?negative cardio ROS ?Normal cardiovascular exam ? ? ?  ?Neuro/Psych ? Headaches, Anxiety   ? GI/Hepatic ?Neg liver ROS, GERD  Controlled,ibs ?  ?Endo/Other  ?negative endocrine ROS ? Renal/GU ?negative Renal ROS  ?negative genitourinary ?  ?Musculoskeletal ? ?(+) Arthritis , LBP  ? Abdominal ?  ?Peds ? Hematology ?negative hematology ROS ?(+)   ?Anesthesia Other Findings ?In preop, pt had vagal episode with eye drop administration.  SP in 70's and HR in 40's. Pt denied CP or SOB. tx with IV glycopyrolate and fluids.   ?Pt a & O. Pt ok to proceed with surgery today. ? ?In years past, pt had a vagal episode with medical procedures (IVs, needles, etc.) ? ?ekg: 04/2021: sr with pvcs; ? ? Reproductive/Obstetrics ? ?  ? ? ? ? ? ? ? ? ? ? ? ? ? ?  ?  ? ? ? ? ? ? ? ?Anesthesia Physical ?Anesthesia Plan ? ?ASA: 2 ? ?Anesthesia Plan: MAC  ? ?Post-op Pain Management:   ? ?Induction:  ? ?PONV Risk Score and Plan: 2 and Midazolam and Ondansetron ? ?Airway Management Planned:  ? ?Additional Equipment:  ? ?Intra-op Plan:  ? ?Post-operative Plan:  ? ?Informed Consent: I have reviewed the patients History and Physical, chart, labs and discussed the procedure including the risks, benefits and alternatives for the proposed anesthesia with the patient or authorized representative who has indicated his/her understanding and acceptance.  ? ? ? ? ? ?Plan Discussed with: CRNA ? ?Anesthesia Plan Comments:   ? ? ? ? ? ? ?Anesthesia Quick Evaluation ? ?

## 2022-03-25 NOTE — Anesthesia Postprocedure Evaluation (Addendum)
Anesthesia Post Note ? ?Patient: Pamela Anthony ? ?Procedure(s) Performed: CATARACT EXTRACTION PHACO AND INTRAOCULAR LENS PLACEMENT (IOC) LEFT (Left: Eye) ? ? ?  ?Patient location during evaluation: PACU ?Anesthesia Type: MAC ?Level of consciousness: awake and alert ?Pain management: pain level controlled ?Vital Signs Assessment: post-procedure vital signs reviewed and stable ?Respiratory status: spontaneous breathing, nonlabored ventilation, respiratory function stable and patient connected to nasal cannula oxygen ?Cardiovascular status: stable and blood pressure returned to baseline ?Postop Assessment: no apparent nausea or vomiting ?Anesthetic complications: no ?Comments: In Pacu, pt SBP in 80's. Other VSS. Pt denies cp/sob. A&o.  tx with IVFluids. ? ?Pt advised to f/u with pcp regarding vasovagal episodes. ? ?Bp 101/67; HR=60s; ? ? ?No notable events documented. ? ?Fidel Levy ? ? ? ? ? ?

## 2022-03-25 NOTE — Op Note (Signed)
OPERATIVE NOTE ? ?Pamela Anthony ?[id number] ?03/25/2022 ? ? ?PREOPERATIVE DIAGNOSIS:  Nuclear sclerotic cataract left eye. H25.12 ?  ?POSTOPERATIVE DIAGNOSIS:    Nuclear sclerotic cataract left eye.   ?  ?PROCEDURE:  Phacoemusification with posterior chamber intraocular lens placement of the left eye  ?Ultrasound time: Procedure(s) with comments: ?CATARACT EXTRACTION PHACO AND INTRAOCULAR LENS PLACEMENT (IOC) LEFT (Left) - 8.88 ?1:13.0 ? ?LENS:   ?Implant Name Type Inv. Item Serial No. Manufacturer Lot No. LRB No. Used Action  ?LENS IOL TECNIS EYHANCE 19.5 - [id number] Intraocular Lens LENS IOL TECNIS EYHANCE 19.5 [id number] [employer]  Left 1 Implanted  ?   ? ?SURGEON:  Wyonia Hough, MD ?  ?ANESTHESIA:  Topical with tetracaine drops and 2% Xylocaine jelly, augmented with 1% preservative-free intracameral lidocaine. ? ?  ?COMPLICATIONS:  None. ?  ?DESCRIPTION OF PROCEDURE:  The patient was identified in the holding room and transported to the operating room and placed in the supine position under the operating microscope.  The left eye was identified as the operative eye and it was prepped and draped in the usual sterile ophthalmic fashion. ?  ?A 1 millimeter clear-corneal paracentesis was made at the 1:30 position.  0.5 ml of preservative-free 1% lidocaine was injected into the anterior chamber. ? The anterior chamber was filled with Viscoat viscoelastic.  A 2.4 millimeter keratome was used to make a near-clear corneal incision at the 10:30 position.  .  A curvilinear capsulorrhexis was made with a cystotome and capsulorrhexis forceps.  Balanced salt solution was used to hydrodissect and hydrodelineate the nucleus. ?  ?Phacoemulsification was then used in stop and chop fashion to remove the lens nucleus and epinucleus.  The remaining cortex was then removed using the irrigation and aspiration handpiece. Provisc was then placed into the capsular bag to distend it for lens placement.  A lens was then  injected into the capsular bag.  The remaining viscoelastic was aspirated. ?  ?Wounds were hydrated with balanced salt solution.  The anterior chamber was inflated to a physiologic pressure with balanced salt solution.  No wound leaks were noted. Cefuroxime 0.1 ml of a '10mg'$ /ml solution was injected into the anterior chamber for a dose of 1 mg of intracameral antibiotic at the completion of the case. ?  Timolol and Brimonidine drops were applied to the eye.  The patient was taken to the recovery room in stable condition without complications of anesthesia or surgery. ? ?[name] ?03/25/2022, 8:56 AM ? ?

## 2022-03-26 ENCOUNTER — Encounter: Payer: Self-pay | Admitting: Ophthalmology

## 2022-03-29 ENCOUNTER — Encounter: Payer: Self-pay | Admitting: Family Medicine

## 2022-03-29 ENCOUNTER — Ambulatory Visit (INDEPENDENT_AMBULATORY_CARE_PROVIDER_SITE_OTHER): Payer: Medicare HMO | Admitting: Family Medicine

## 2022-03-29 DIAGNOSIS — R55 Syncope and collapse: Secondary | ICD-10-CM | POA: Insufficient documentation

## 2022-03-29 NOTE — Progress Notes (Signed)
? ?Subjective:  ? ? Patient ID: Pamela Anthony, female    DOB: 08-Aug-1956, 66 y.o.   MRN: [id number] ? ?HPI ?Pt presents with a vaso vagal reaction  ? ?Wt Readings from Last 3 Encounters:  ?03/29/22 169 lb 8 oz (76.9 kg)  ?03/25/22 168 lb 8 oz (76.4 kg)  ?10/31/21 200 lb (90.7 kg)  ? ?27.36 kg/m? ? ?Lost weight with [place] [facility]  ?Started in in October and is thrilled with result  ?Now maintaining (changes are sustainable)  ?Avoids bread and avoids excessive dairy  ? ?Feels a lot better  ? ? ?Had a recent cataract surgery  ?SBP went down into 80s when in PACU  but other vitals were normal  ? ?She states she did not feel afraid  ?The numbing drops made her eye hurt briefly and she got anxious  ?Then she felt like she was going to pass out  ? ?She was given med to raise her bp  ?Had to give her a bag of fluids before and after  ? ?Had fasted for procedure  ?Not allowed to eat or drink after 10  ? ?BP Readings from Last 3 Encounters:  ?03/29/22 124/72  ?03/25/22 101/67  ?10/31/21 120/68  ? ?Pulse Readings from Last 3 Encounters:  ?03/29/22 92  ?03/25/22 69  ?10/31/21 83  ? ?Takes hctz prn for edema  ?She had taken that  ?Does not need it  ? ?She used to get faint with blood draws and IVs  ?Grew out of it  ? ?No sob or cp or dizziness ?Feels good  ?Bp and pulse are fine  ? ?EKG today NSR rate of 64 ?Read as low voltage in limb leads  ?No acute changes from prior ekg ? ?Lab Results  ?Component Value Date  ? CREATININE 0.63 10/31/2021  ? BUN 12 10/31/2021  ? NA 138 10/31/2021  ? K 3.7 10/31/2021  ? CL 100 10/31/2021  ? CO2 28 10/31/2021  ? ?Lab Results  ?Component Value Date  ? WBC 4.2 10/31/2021  ? HGB 12.9 10/31/2021  ? HCT 39.0 10/31/2021  ? MCV 88.6 10/31/2021  ? PLT 239 10/31/2021  ? ? ?Patient Active Problem List  ? Diagnosis Date Noted  ? Vaso-vagal reaction 03/29/2022  ? Encounter for screening for HIV 10/31/2021  ? Encounter for hepatitis C screening test for low risk patient 10/31/2021  ? Elevated  antinuclear antibody (ANA) level 09/20/2020  ? Candidal intertrigo 04/28/2020  ? Colon cancer screening 03/18/2018  ? Prediabetes 03/16/2015  ? Encounter for routine gynecological examination 09/01/2013  ? Depression with anxiety 01/18/2013  ? Post-menopausal 08/14/2012  ? Other screening mammogram 04/29/2011  ? Routine general medical examination at a health care facility 04/22/2011  ? Joint pain 02/28/2010  ? Myofascial pain syndrome 02/28/2010  ? Vitamin D deficiency 03/08/2009  ? IRRITABLE BOWEL SYNDROME 06/27/2008  ? B12 deficiency 02/23/2008  ? POLYP, COLON 03/06/2007  ? Hyperlipidemia 03/06/2007  ? GERD 03/06/2007  ? HIATAL HERNIA 03/06/2007  ? OSA (obstructive sleep apnea) 03/06/2007  ? URINARY INCONTINENCE 03/06/2007  ? Migraine 03/06/2007  ? ?Past Medical History:  ?Diagnosis Date  ? Allergic rhinitis   ? Allergy   ? Anxiety   ? B12 deficiency   ? Degenerative disc disease   ? back, knee  ? Family history of adverse reaction to anesthesia   ? Fungal infection 2005  ? lamasil   ? GERD (gastroesophageal reflux disease)   ? EGD 10/2004  ? Hemorrhoids, internal   ?  colonoscopy 02/2008  ? Hyperlipemia   ? Iron deficiency anemia   ? Low back pain   ? Migraine headache   ? improved with medications  ? Obesity   ? Orthodontics   ? InvisAlign  ? Sleep apnea   ? no cpap  ? Urinary incontinence   ? Vaso-vagal reaction   ? occured after eye drops for cataract surgery, but pt reports this has happened before during medical procedures  ? Vitamin D deficiency   ? mild  ? ?Past Surgical History:  ?Procedure Laterality Date  ? CATARACT EXTRACTION W/PHACO Left 03/25/2022  ? Procedure: CATARACT EXTRACTION PHACO AND INTRAOCULAR LENS PLACEMENT ([place]) LEFT;  Surgeon: Leandrew Koyanagi, MD;  Location: [facility];  Service: Ophthalmology;  Laterality: Left;  8.88 ?1:13.0  ? [place]  ? COLONOSCOPY    ? DILATION AND CURETTAGE OF UTERUS  2005  ? HAND SURGERY  2000  ? nodule removed  ? KNEE SURGERY  1970's  ?  right  ? NASAL SINUS SURGERY  2000's  ? right maxillary  ? TONSILLECTOMY AND ADENOIDECTOMY    ? ?Social History  ? ?Tobacco Use  ? Smoking status: Never  ? Smokeless tobacco: Never  ?Vaping Use  ? Vaping Use: Never used  ?Substance Use Topics  ? Alcohol use: Yes  ?  Alcohol/week: 0.0 standard drinks  ?  Comment: occasional  ? Drug use: No  ? ?Family History  ?Problem Relation Age of Onset  ? Coronary artery disease Father   ?     in his 26's  ? Arthritis Father   ? Stroke Father   ? Heart disease Father   ?     pacemaker  ? Prostate cancer Father   ? Breast cancer Maternal Grandmother   ? Colon cancer Other   ? Breast cancer Cousin   ?     mat cousin  ? Colon polyps Neg Hx   ? ?Allergies  ?Allergen Reactions  ? Atorvastatin   ?  REACTION: myalgias  ? Oxybutynin   ?  REACTION: rash with  patch   ? ?Current Outpatient Medications on File Prior to Visit  ?Medication Sig Dispense Refill  ? Cholecalciferol (VITAMIN D-3 PO) Take 1 tablet by mouth daily.    ? Cyanocobalamin (B-12 PO) Take by mouth daily.    ? FLUoxetine (PROZAC) 20 MG capsule Take 3 capsules (60 mg total) by mouth daily. (Patient taking differently: Take 40 mg by mouth daily.) 270 capsule 3  ? fluticasone (FLONASE) 50 MCG/ACT nasal spray Place 2 sprays into both nostrils daily. 16 g 6  ? ketoconazole (NIZORAL) 2 % cream Apply 1 application topically daily. 30 g 1  ? loratadine (CLARITIN) 10 MG tablet Take 1 tablet (10 mg total) by mouth daily. 90 tablet 3  ? mometasone (ELOCON) 0.1 % cream   0  ? omeprazole (PRILOSEC) 20 MG capsule Take 1 capsule (20 mg total) by mouth daily. 90 capsule 3  ? oxybutynin (DITROPAN-XL) 10 MG 24 hr tablet Take 1 tablet (10 mg total) by mouth at bedtime. 90 tablet 3  ? Probiotic Product (ALIGN PO) Take by mouth daily.    ? rosuvastatin (CRESTOR) 10 MG tablet Take 1 tablet (10 mg total) by mouth daily. 90 tablet 3  ? SUMAtriptan (IMITREX) 20 MG/ACT nasal spray Place 1 spray (20 mg total) into the nose every 2 (two) hours as  needed for migraine or headache. May repeat in 2 hours if headache persists or  recurs. 6 each 11  ? topiramate (TOPAMAX) 50 MG tablet Take 1 tablet (50 mg total) by mouth at bedtime. 90 tablet 3  ? ?No current facility-administered medications on file prior to visit.  ?  ? ?Review of Systems  ?Constitutional:  Negative for activity change, appetite change, fatigue, fever and unexpected weight change.  ?     Intentional wt loss ?Feels great   ?HENT:  Negative for congestion, ear pain, rhinorrhea, sinus pressure and sore throat.   ?Eyes:  Positive for visual disturbance. Negative for pain and redness.  ?     Cataracts  ?Had one eye procedure and planning the next   ?Respiratory:  Negative for cough, shortness of breath and wheezing.   ?Cardiovascular:  Negative for chest pain and palpitations.  ?Gastrointestinal:  Negative for abdominal pain, blood in stool, constipation and diarrhea.  ?Endocrine: Negative for polydipsia and polyuria.  ?Genitourinary:  Negative for dysuria, frequency and urgency.  ?Musculoskeletal:  Negative for arthralgias, back pain and myalgias.  ?Skin:  Negative for pallor and rash.  ?Allergic/Immunologic: Negative for environmental allergies.  ?Neurological:  Negative for dizziness, syncope and headaches.  ?Hematological:  Negative for adenopathy. Does not bruise/bleed easily.  ?Psychiatric/Behavioral:  Negative for decreased concentration and dysphoric mood. The patient is not nervous/anxious.   ? ?   ?Objective:  ? Physical Exam ?Constitutional:   ?   General: She is not in acute distress. ?   Appearance: Normal appearance. She is well-developed and normal weight. She is not ill-appearing or diaphoretic.  ?   Comments: Wt loss noted  ?HENT:  ?   Head: Normocephalic and atraumatic.  ?Eyes:  ?   General: No scleral icterus. ?   Conjunctiva/sclera: Conjunctivae normal.  ?   Pupils: Pupils are equal, round, and reactive to light.  ?Neck:  ?   Thyroid: No thyromegaly.  ?   Vascular: No carotid bruit  or JVD.  ?Cardiovascular:  ?   Rate and Rhythm: Normal rate and regular rhythm.  ?   Pulses: Normal pulses.  ?   Heart sounds: Normal heart sounds.  ?  No gallop.  ?Pulmonary:  ?   Effort: Pulmonary effort is

## 2022-03-29 NOTE — Patient Instructions (Addendum)
Try holding the hctz  ?If you don't need it, great  ?If you do , still hold for 4 days before your cataract surgery  ? ?Drink lots of fluids  ? ?The day before the cataract surgery- drink lots of fluids and eat salty thinks  ? ? ? ? ? ? ? ? ?

## 2022-03-29 NOTE — Assessment & Plan Note (Signed)
Pt had vaso vagal rxn with cataract surgery and required IVF to raise bp  ?Noted she was prone to this in the past with blood draw and IV but "grew out of it" ?May have been dehydrated and also had 50 lb intentional wt loss the 6 mo prior  ?EKG reviewed and nl vitals today  ?Disc ways to prevent this for next procedure:  ?Will hold hctz (can go ahead and stop permanently if not needed)  ?Drink fluids the day before up until her npo period  ?Also push salty food the day before  ?Knows what to watch for as well  ? ? ?

## 2022-04-02 DIAGNOSIS — H2511 Age-related nuclear cataract, right eye: Secondary | ICD-10-CM | POA: Diagnosis not present

## 2022-04-15 NOTE — Discharge Instructions (Signed)

## 2022-04-17 ENCOUNTER — Ambulatory Visit
Admission: RE | Admit: 2022-04-17 | Discharge: 2022-04-17 | Disposition: A | Discharge status: Home / Self Care | Payer: Medicare HMO | Attending: Ophthalmology | Admitting: Ophthalmology

## 2022-04-17 ENCOUNTER — Ambulatory Visit: Payer: Medicare HMO | Admitting: Anesthesiology

## 2022-04-17 ENCOUNTER — Encounter
Admission: RE | Disposition: A | Discharge status: Home / Self Care | Payer: Self-pay | Source: Home / Self Care | Attending: Ophthalmology

## 2022-04-17 ENCOUNTER — Encounter: Payer: Self-pay | Admitting: Ophthalmology

## 2022-04-17 ENCOUNTER — Other Ambulatory Visit: Payer: Self-pay

## 2022-04-17 DIAGNOSIS — H2511 Age-related nuclear cataract, right eye: Secondary | ICD-10-CM | POA: Insufficient documentation

## 2022-04-17 DIAGNOSIS — K219 Gastro-esophageal reflux disease without esophagitis: Secondary | ICD-10-CM | POA: Insufficient documentation

## 2022-04-17 DIAGNOSIS — F419 Anxiety disorder, unspecified: Secondary | ICD-10-CM | POA: Insufficient documentation

## 2022-04-17 DIAGNOSIS — G4730 Sleep apnea, unspecified: Secondary | ICD-10-CM | POA: Insufficient documentation

## 2022-04-17 DIAGNOSIS — H25811 Combined forms of age-related cataract, right eye: Secondary | ICD-10-CM | POA: Diagnosis not present

## 2022-04-17 HISTORY — PX: CATARACT EXTRACTION W/PHACO: SHX586

## 2022-04-17 SURGERY — PHACOEMULSIFICATION, CATARACT, WITH IOL INSERTION
Anesthesia: Monitor Anesthesia Care | Site: Eye | Laterality: Right

## 2022-04-17 MED ORDER — SIGHTPATH DOSE#1 BSS IO SOLN
INTRAOCULAR | Status: DC | PRN
  Administered 2022-04-17: 15 mL via INTRAOCULAR

## 2022-04-17 MED ORDER — TETRACAINE HCL 0.5 % OP SOLN
1.0000 [drp] | OPHTHALMIC | Status: DC | PRN
Start: 2022-04-17 — End: 2022-04-17
  Administered 2022-04-17 (×3): 1 [drp] via OPHTHALMIC

## 2022-04-17 MED ORDER — MIDAZOLAM HCL 2 MG/2ML IJ SOLN
INTRAMUSCULAR | Status: DC | PRN
Start: 2022-04-17 — End: 2022-04-17
  Administered 2022-04-17: 2 mg via INTRAVENOUS

## 2022-04-17 MED ORDER — SIGHTPATH DOSE#1 BSS IO SOLN
INTRAOCULAR | Status: DC | PRN
  Administered 2022-04-17: 82 mL via OPHTHALMIC

## 2022-04-17 MED ORDER — CYCLOPENTOLATE HCL 2 % OP SOLN
1.0000 [drp] | OPHTHALMIC | Status: DC | PRN
Start: 2022-04-17 — End: 2022-04-17
  Administered 2022-04-17 (×3): 1 [drp] via OPHTHALMIC

## 2022-04-17 MED ORDER — ACETAMINOPHEN 160 MG/5ML PO SOLN: 325.0000 mg | ORAL | Status: DC | PRN

## 2022-04-17 MED ORDER — ACETAMINOPHEN 325 MG PO TABS: 325.0000 mg | ORAL_TABLET | ORAL | Status: DC | PRN

## 2022-04-17 MED ORDER — SIGHTPATH DOSE#1 BSS IO SOLN
INTRAOCULAR | Status: DC | PRN
  Administered 2022-04-17: 2 mL

## 2022-04-17 MED ORDER — BRIMONIDINE TARTRATE-TIMOLOL 0.2-0.5 % OP SOLN
OPHTHALMIC | Status: DC | PRN
  Administered 2022-04-17: 1 [drp] via OPHTHALMIC

## 2022-04-17 MED ORDER — CEFUROXIME OPHTHALMIC INJECTION 1 MG/0.1 ML
INJECTION | OPHTHALMIC | Status: DC | PRN
  Administered 2022-04-17: 0.1 mL via INTRACAMERAL

## 2022-04-17 MED ORDER — FENTANYL CITRATE (PF) 100 MCG/2ML IJ SOLN
INTRAMUSCULAR | Status: DC | PRN
  Administered 2022-04-17: 100 ug via INTRAVENOUS

## 2022-04-17 MED ORDER — ONDANSETRON HCL 4 MG/2ML IJ SOLN: 4.0000 mg | Freq: Once | INTRAMUSCULAR | Status: DC | PRN

## 2022-04-17 MED ORDER — SIGHTPATH DOSE#1 NA HYALUR & NA CHOND-NA HYALUR IO KIT
PACK | INTRAOCULAR | Status: DC | PRN
  Administered 2022-04-17: 1 via OPHTHALMIC

## 2022-04-17 MED ORDER — PHENYLEPHRINE HCL 10 % OP SOLN
1.0000 [drp] | OPHTHALMIC | Status: DC | PRN
  Administered 2022-04-17 (×3): 1 [drp] via OPHTHALMIC

## 2022-04-17 SURGICAL SUPPLY — 13 items
CANNULA ANT/CHMB 27G (MISCELLANEOUS) IMPLANT
CANNULA ANT/CHMB 27GA (MISCELLANEOUS) IMPLANT
CATARACT SUITE SIGHTPATH (MISCELLANEOUS) ×2 IMPLANT
FEE CATARACT SUITE SIGHTPATH (MISCELLANEOUS) ×1 IMPLANT
GLOVE SRG 8 PF TXTR STRL LF DI (GLOVE) ×1 IMPLANT
GLOVE SURG ENC TEXT LTX SZ7.5 (GLOVE) ×2 IMPLANT
GLOVE SURG UNDER POLY LF SZ8 (GLOVE) ×2
LENS IOL TECNIS EYHANCE 19.0 (Intraocular Lens) ×1 IMPLANT
NDL FILTER BLUNT 18X1 1/2 (NEEDLE) ×1 IMPLANT
NEEDLE FILTER BLUNT 18X 1/2SAF (NEEDLE) ×1
NEEDLE FILTER BLUNT 18X1 1/2 (NEEDLE) ×1 IMPLANT
SYR 3ML LL SCALE MARK (SYRINGE) ×2 IMPLANT
WATER STERILE IRR 250ML POUR (IV SOLUTION) ×2 IMPLANT

## 2022-04-17 NOTE — Transfer of Care (Signed)
Immediate Anesthesia Transfer of Care Note ? ?Patient: Pamela Anthony ? ?Procedure(s) Performed: CATARACT EXTRACTION PHACO AND INTRAOCULAR LENS PLACEMENT (IOC) RIGHT 7.22 01:22.1 (Right: Eye) ? ?Patient Location: PACU ? ?Anesthesia Type: MAC ? ?Level of Consciousness: awake, alert  and patient cooperative ? ?Airway and Oxygen Therapy: Patient Spontanous Breathing and Patient connected to supplemental oxygen ? ?Post-op Assessment: Post-op Vital signs reviewed, Patient's Cardiovascular Status Stable, Respiratory Function Stable, Patent Airway and No signs of Nausea or vomiting ? ?Post-op Vital Signs: Reviewed and stable ? ?Complications: No notable events documented. ? ?

## 2022-04-17 NOTE — Anesthesia Preprocedure Evaluation (Signed)
Anesthesia Evaluation  ?Patient identified by MRN, date of birth, ID band ?Patient awake ? ? ? ?Reviewed: ?NPO status  ? ?History of Anesthesia Complications ?Negative for: history of anesthetic complications ? ?Airway ?Mallampati: II ? ?TM Distance: >3 FB ?Neck ROM: full ? ? ? Dental ?no notable dental hx. ? ?  ?Pulmonary ?sleep apnea (no cpap) ,  ?  ?Pulmonary exam normal ? ? ? ? ? ? ? Cardiovascular ?Exercise Tolerance: Good ?negative cardio ROS ?Normal cardiovascular exam ? ? ?  ?Neuro/Psych ? Headaches, Anxiety   ? GI/Hepatic ?GERD  Controlled,ibs ?  ?Endo/Other  ? ? Renal/GU ?  ? ?  ?Musculoskeletal ? ?(+) Arthritis , LBP  ? Abdominal ?  ?Peds ? Hematology ?  ?Anesthesia Other Findings ? ? ? Reproductive/Obstetrics ? ?  ? ? ? ? ? ? ? ? ? ? ? ? ? ?  ?  ? ? ? ? ? ? ? ? ?Anesthesia Physical ? ?Anesthesia Plan ? ?ASA: 2 ? ?Anesthesia Plan: MAC  ? ?Post-op Pain Management:   ? ?Induction:  ? ?PONV Risk Score and Plan: 2 and Midazolam, Treatment may vary due to age or medical condition and TIVA ? ?Airway Management Planned: Nasal Cannula ? ?Additional Equipment:  ? ?Intra-op Plan:  ? ?Post-operative Plan:  ? ?Informed Consent: I have reviewed the patients History and Physical, chart, labs and discussed the procedure including the risks, benefits and alternatives for the proposed anesthesia with the patient or authorized representative who has indicated his/her understanding and acceptance.  ? ? ? ? ? ?Plan Discussed with: CRNA ? ?Anesthesia Plan Comments:   ? ? ? ? ? ? ?Anesthesia Quick Evaluation ? ?

## 2022-04-17 NOTE — H&P (Signed)
?[facility]  ? ?Primary Care Physician:  Tower, Wynelle Fanny, MD ?Ophthalmologist: Dr. Leandrew Koyanagi ? ?Pre-Procedure History & Physical: ?HPI:  Pamela Anthony is a 66 y.o. female here for ophthalmic surgery. ?  ?Past Medical History:  ?Diagnosis Date  ? Allergic rhinitis   ? Allergy   ? Anxiety   ? B12 deficiency   ? Degenerative disc disease   ? back, knee  ? Family history of adverse reaction to anesthesia   ? Fungal infection 2005  ? lamasil   ? GERD (gastroesophageal reflux disease)   ? EGD 10/2004  ? Hemorrhoids, internal   ? colonoscopy 02/2008  ? Hyperlipemia   ? Iron deficiency anemia   ? Low back pain   ? Migraine headache   ? improved with medications  ? Obesity   ? Orthodontics   ? InvisAlign  ? Sleep apnea   ? no cpap  ? Urinary incontinence   ? Vaso-vagal reaction   ? occured after eye drops for cataract surgery, but pt reports this has happened before during medical procedures  ? Vitamin D deficiency   ? mild  ? ? ?Past Surgical History:  ?Procedure Laterality Date  ? CATARACT EXTRACTION W/PHACO Left 03/25/2022  ? Procedure: CATARACT EXTRACTION PHACO AND INTRAOCULAR LENS PLACEMENT ([place]) LEFT;  Surgeon: Leandrew Koyanagi, MD;  Location: [place];  Service: Ophthalmology;  Laterality: Left;  8.88 ?1:13.0  ? [place]  ? COLONOSCOPY    ? DILATION AND CURETTAGE OF UTERUS  2005  ? HAND SURGERY  2000  ? nodule removed  ? KNEE SURGERY  1970's  ? right  ? NASAL SINUS SURGERY  2000's  ? right maxillary  ? TONSILLECTOMY AND ADENOIDECTOMY    ? ? ?Prior to Admission medications   ?Medication Sig Start Date End Date Taking? Authorizing Provider  ?Cholecalciferol (VITAMIN D-3 PO) Take 1 tablet by mouth daily.   Yes [provider]  ?Cyanocobalamin (B-12 PO) Take by mouth daily.   Yes [provider]  ?FLUoxetine (PROZAC) 20 MG capsule Take 3 capsules (60 mg total) by mouth daily. ?Patient taking differently: Take 40 mg by mouth daily. 02/11/22  Yes Tower, Wynelle Fanny,  MD  ?fluticasone (FLONASE) 50 MCG/ACT nasal spray Place 2 sprays into both nostrils daily. 02/11/22  Yes Tower, Wynelle Fanny, MD  ?ketoconazole (NIZORAL) 2 % cream Apply 1 application topically daily. 04/28/20  Yes Tower, Wynelle Fanny, MD  ?loratadine (CLARITIN) 10 MG tablet Take 1 tablet (10 mg total) by mouth daily. 02/11/22  Yes Tower, Wynelle Fanny, MD  ?mometasone (ELOCON) 0.1 % cream  03/11/18  Yes [provider]  ?omeprazole (PRILOSEC) 20 MG capsule Take 1 capsule (20 mg total) by mouth daily. 02/11/22  Yes Tower, Wynelle Fanny, MD  ?oxybutynin (DITROPAN-XL) 10 MG 24 hr tablet Take 1 tablet (10 mg total) by mouth at bedtime. 02/11/22  Yes Tower, Wynelle Fanny, MD  ?Probiotic Product (ALIGN PO) Take by mouth daily.   Yes [provider]  ?rosuvastatin (CRESTOR) 10 MG tablet Take 1 tablet (10 mg total) by mouth daily. 02/11/22  Yes Tower, Wynelle Fanny, MD  ?SUMAtriptan (IMITREX) 20 MG/ACT nasal spray Place 1 spray (20 mg total) into the nose every 2 (two) hours as needed for migraine or headache. May repeat in 2 hours if headache persists or recurs. 10/31/21  Yes Tower, Wynelle Fanny, MD  ?topiramate (TOPAMAX) 50 MG tablet Take 1 tablet (50 mg total) by mouth at bedtime. 02/11/22  Yes  Tower, Wynelle Fanny, MD  ? ? ?Allergies as of 02/28/2022 - Review Complete 10/31/2021  ?Allergen Reaction Noted  ? Atorvastatin  07/14/2007  ? Oxybutynin  07/14/2007  ? ? ?Family History  ?Problem Relation Age of Onset  ? Coronary artery disease Father   ?     in his 75's  ? Arthritis Father   ? Stroke Father   ? Heart disease Father   ?     pacemaker  ? Prostate cancer Father   ? Breast cancer Maternal Grandmother   ? Colon cancer Other   ? Breast cancer Cousin   ?     mat cousin  ? Colon polyps Neg Hx   ? ? ?Social History  ? ?Socioeconomic History  ? Marital status: Married  ?  Spouse name: Not on file  ? Number of children: 2  ? Years of education: Not on file  ? Highest education level: Not on file  ?Occupational History  ? Occupation: customer service  ?Tobacco  Use  ? Smoking status: Never  ? Smokeless tobacco: Never  ?Vaping Use  ? Vaping Use: Never used  ?Substance and Sexual Activity  ? Alcohol use: Yes  ?  Alcohol/week: 0.0 standard drinks  ?  Comment: occasional  ? Drug use: No  ? Sexual activity: Not on file  ?Other Topics Concern  ? Not on file  ?Social History Narrative  ? Not on file  ? ?Social Determinants of Health  ? ?Financial Resource Strain: Not on file  ?Food Insecurity: Not on file  ?Transportation Needs: Not on file  ?Physical Activity: Not on file  ?Stress: Not on file  ?Social Connections: Not on file  ?Intimate Partner Violence: Not on file  ? ? ?Review of Systems: ?See HPI, otherwise negative ROS ? ?Physical Exam: ?BP 127/73   Pulse 68   Temp 97.6 ?F (36.4 ?C) (Temporal)   Resp 20   Wt 76.7 kg   LMP 04/17/2011   SpO2 100%   BMI 27.28 kg/m?  ?General:   Alert,  pleasant and cooperative in NAD ?Head:  Normocephalic and atraumatic. ?Lungs:  Clear to auscultation.    ?Heart:  Regular rate and rhythm.  ? ?Impression/Plan: ?Pamela Anthony is here for ophthalmic surgery. ? ?Risks, benefits, limitations, and alternatives regarding ophthalmic surgery have been reviewed with the patient.  Questions have been answered.  All parties agreeable. ? ? Leandrew Koyanagi, MD  04/17/2022, 7:32 AM ? ?

## 2022-04-17 NOTE — Op Note (Signed)
?  LOCATION:  [place] ?  ?PREOPERATIVE DIAGNOSIS:    Nuclear sclerotic cataract right eye. H25.11 ?  ?POSTOPERATIVE DIAGNOSIS:  Nuclear sclerotic cataract right eye.   ?  ?PROCEDURE:  Phacoemusification with posterior chamber intraocular lens placement of the right eye  ? ?ULTRASOUND TIME: Procedure(s) with comments: ?CATARACT EXTRACTION PHACO AND INTRAOCULAR LENS PLACEMENT (IOC) RIGHT 7.22 01:22.1 (Right) - sleep apnea ? ?LENS:   ?Implant Name Type Inv. Item Serial No. Manufacturer Lot No. LRB No. Used Action  ?LENS IOL TECNIS EYHANCE 19.0 - [id number] Intraocular Lens LENS IOL TECNIS EYHANCE 19.0 [id number] [employer]  Right 1 Implanted  ?   ?  ?  ?SURGEON:  Wyonia Hough, MD ?  ?ANESTHESIA:  Topical with tetracaine drops and 2% Xylocaine jelly, augmented with 1% preservative-free intracameral lidocaine. ? ?  ?COMPLICATIONS:  None. ?  ?DESCRIPTION OF PROCEDURE:  The patient was identified in the holding room and transported to the operating room and placed in the supine position under the operating microscope.  The right eye was identified as the operative eye and it was prepped and draped in the usual sterile ophthalmic fashion. ?  ?A 1 millimeter clear-corneal paracentesis was made at the 12:00 position.  0.5 ml of preservative-free 1% lidocaine was injected into the anterior chamber. ?The anterior chamber was filled with Viscoat viscoelastic.  A 2.4 millimeter keratome was used to make a near-clear corneal incision at the 9:00 position.  A curvilinear capsulorrhexis was made with a cystotome and capsulorrhexis forceps.  Balanced salt solution was used to hydrodissect and hydrodelineate the nucleus. ?  ?Phacoemulsification was then used in stop and chop fashion to remove the lens nucleus and epinucleus.  The remaining cortex was then removed using the irrigation and aspiration handpiece. Provisc was then placed into the capsular bag to distend it for lens placement.  A lens was then  injected into the capsular bag.  The remaining viscoelastic was aspirated. ?  ?Wounds were hydrated with balanced salt solution.  The anterior chamber was inflated to a physiologic pressure with balanced salt solution.  No wound leaks were noted. Cefuroxime 0.1 ml of a '10mg'$ /ml solution was injected into the anterior chamber for a dose of 1 mg of intracameral antibiotic at the completion of the case. ?  Timolol and Brimonidine drops were applied to the eye.  The patient was taken to the recovery room in stable condition without complications of anesthesia or surgery. ? ? ?Pamela Anthony ?04/17/2022, 8:26 AM ? ?

## 2022-04-17 NOTE — Anesthesia Postprocedure Evaluation (Signed)
Anesthesia Post Note ? ?Patient: Pamela Anthony ? ?Procedure(s) Performed: CATARACT EXTRACTION PHACO AND INTRAOCULAR LENS PLACEMENT ([facility]) RIGHT 7.22 01:22.1 (Right: Eye) ? ? ?  ?Patient location during evaluation: PACU ?Anesthesia Type: MAC ?Level of consciousness: awake ?Pain management: pain level controlled ?Vital Signs Assessment: post-procedure vital signs reviewed and stable ?Respiratory status: respiratory function stable ?Cardiovascular status: stable ?Postop Assessment: no signs of nausea or vomiting ?Anesthetic complications: no ? ? ?No notable events documented. ? ?Veda Canning ? ? ? ? ? ?

## 2022-04-18 ENCOUNTER — Encounter: Payer: Self-pay | Admitting: Ophthalmology

## 2022-05-13 ENCOUNTER — Encounter: Payer: Self-pay | Admitting: Family Medicine

## 2022-05-14 ENCOUNTER — Ambulatory Visit (INDEPENDENT_AMBULATORY_CARE_PROVIDER_SITE_OTHER)
Admission: RE | Admit: 2022-05-14 | Discharge: 2022-05-14 | Disposition: A | Discharge status: Home / Self Care | Payer: Medicare HMO | Source: Ambulatory Visit | Attending: Family Medicine | Admitting: Family Medicine

## 2022-05-14 ENCOUNTER — Ambulatory Visit (INDEPENDENT_AMBULATORY_CARE_PROVIDER_SITE_OTHER): Payer: Medicare HMO | Admitting: Family Medicine

## 2022-05-14 ENCOUNTER — Encounter: Payer: Self-pay | Admitting: Family Medicine

## 2022-05-14 VITALS — BP 104/66 | HR 70 | Ht 66.0 in | Wt 170.0 lb

## 2022-05-14 DIAGNOSIS — E538 Deficiency of other specified B group vitamins: Secondary | ICD-10-CM

## 2022-05-14 DIAGNOSIS — R200 Anesthesia of skin: Secondary | ICD-10-CM | POA: Diagnosis not present

## 2022-05-14 DIAGNOSIS — R202 Paresthesia of skin: Secondary | ICD-10-CM | POA: Diagnosis not present

## 2022-05-14 NOTE — Assessment & Plan Note (Signed)
L ant shin and top of foot  Correlates with L5 dermatome  Nl exam/no skin changes No motor changes Disc poss of radiculoapthy/ lumbar? LS xray ordered  Recommend walking  Watch for any worse symptoms or change in strength or back pain

## 2022-05-14 NOTE — Assessment & Plan Note (Addendum)
Oral supplementation   Lab Results  Component Value Date   VITAMINB12 1,190 (H) 10/31/2021

## 2022-05-14 NOTE — Progress Notes (Signed)
Subjective:    Patient ID: Pamela Anthony, female    DOB: 06/14/1956, 66 y.o.   MRN: [id number]  HPI Pt presents for numbness in her leg  Wt Readings from Last 3 Encounters:  05/14/22 170 lb (77.1 kg)  04/17/22 169 lb (76.7 kg)  03/29/22 169 lb 8 oz (76.9 kg)   27.44 kg/m   1 week  Left leg - numbness From 1/2 way down shin to top of her foot (more lateral than medial)  No burning  It aches at times  Not swollen but does feel tight   Has an occasional muscle cramp in leg   No new exercise  Not as much walking at usual   Low back is not worse than usual  Less padding after loosing wt  Occ some groin pain on L    Feels like it went to sleep    Takes topamax    H/o low B12 Lab Results  Component Value Date   VITAMINB12 1,190 (H) 10/31/2021   Prediabetes Lab Results  Component Value Date   HGBA1C 4.7 10/31/2021   Patient Active Problem List   Diagnosis Date Noted   Paresthesia of left leg 05/14/2022   Vaso-vagal reaction 03/29/2022   Encounter for screening for HIV 10/31/2021   Encounter for hepatitis C screening test for low risk patient 10/31/2021   Elevated antinuclear antibody (ANA) level 09/20/2020   Candidal intertrigo 04/28/2020   Colon cancer screening 03/18/2018   Prediabetes 03/16/2015   Encounter for routine gynecological examination 09/01/2013   Depression with anxiety 01/18/2013   Post-menopausal 08/14/2012   Other screening mammogram 04/29/2011   Routine general medical examination at a health care facility 04/22/2011   Joint pain 02/28/2010   Myofascial pain syndrome 02/28/2010   Vitamin D deficiency 03/08/2009   IRRITABLE BOWEL SYNDROME 06/27/2008   B12 deficiency 02/23/2008   POLYP, COLON 03/06/2007   Hyperlipidemia 03/06/2007   GERD 03/06/2007   HIATAL HERNIA 03/06/2007   OSA (obstructive sleep apnea) 03/06/2007   URINARY INCONTINENCE 03/06/2007   Migraine 03/06/2007   Past Medical History:  Diagnosis Date   Allergic  rhinitis    Allergy    Anxiety    B12 deficiency    Degenerative disc disease    back, knee   Family history of adverse reaction to anesthesia    Fungal infection 2005   lamasil    GERD (gastroesophageal reflux disease)    EGD 10/2004   Hemorrhoids, internal    colonoscopy 02/2008   Hyperlipemia    Iron deficiency anemia    Low back pain    Migraine headache    improved with medications   Obesity    Orthodontics    InvisAlign   Sleep apnea    no cpap   Urinary incontinence    Vaso-vagal reaction    occured after eye drops for cataract surgery, but pt reports this has happened before during medical procedures   Vitamin D deficiency    mild   Past Surgical History:  Procedure Laterality Date   CATARACT EXTRACTION W/PHACO Left 03/25/2022   Procedure: CATARACT EXTRACTION PHACO AND INTRAOCULAR LENS PLACEMENT ([place]) LEFT;  Surgeon: Leandrew Koyanagi, MD;  Location: [place];  Service: Ophthalmology;  Laterality: Left;  8.88 1:13.0   CATARACT EXTRACTION W/PHACO Right 04/17/2022   Procedure: CATARACT EXTRACTION PHACO AND INTRAOCULAR LENS PLACEMENT ([facility]) RIGHT 7.22 01:22.1;  Surgeon: Leandrew Koyanagi, MD;  Location: [place];  Service: Ophthalmology;  Laterality: Right;  sleep apnea  [place] AND CURETTAGE OF UTERUS  2005   HAND SURGERY  2000   nodule removed   KNEE SURGERY  1970's   right   NASAL SINUS SURGERY  2000's   right maxillary   TONSILLECTOMY AND ADENOIDECTOMY     Social History   Tobacco Use   Smoking status: Never   Smokeless tobacco: Never  Vaping Use   Vaping Use: Never used  Substance Use Topics   Alcohol use: Yes    Alcohol/week: 0.0 standard drinks    Comment: occasional   Drug use: No   Family History  Problem Relation Age of Onset   Coronary artery disease Father        in his 47's   Arthritis Father    Stroke Father    Heart disease Father        pacemaker   Prostate  cancer Father    Breast cancer Maternal Grandmother    Colon cancer Other    Breast cancer Cousin        mat cousin   Colon polyps Neg Hx    Allergies  Allergen Reactions   Atorvastatin     REACTION: myalgias   Oxybutynin     REACTION: rash with  patch    Current Outpatient Medications on File Prior to Visit  Medication Sig Dispense Refill   Cholecalciferol (VITAMIN D-3 PO) Take 1 tablet by mouth daily.     Cyanocobalamin (B-12 PO) Take by mouth daily.     FLUoxetine (PROZAC) 20 MG capsule Take 3 capsules (60 mg total) by mouth daily. (Patient taking differently: Take 40 mg by mouth daily.) 270 capsule 3   fluticasone (FLONASE) 50 MCG/ACT nasal spray Place 2 sprays into both nostrils daily. 16 g 6   ketoconazole (NIZORAL) 2 % cream Apply 1 application topically daily. 30 g 1   loratadine (CLARITIN) 10 MG tablet Take 1 tablet (10 mg total) by mouth daily. 90 tablet 3   mometasone (ELOCON) 0.1 % cream   0   omeprazole (PRILOSEC) 20 MG capsule Take 1 capsule (20 mg total) by mouth daily. 90 capsule 3   oxybutynin (DITROPAN-XL) 10 MG 24 hr tablet Take 1 tablet (10 mg total) by mouth at bedtime. 90 tablet 3   Probiotic Product (ALIGN PO) Take by mouth daily.     rosuvastatin (CRESTOR) 10 MG tablet Take 1 tablet (10 mg total) by mouth daily. 90 tablet 3   SUMAtriptan (IMITREX) 20 MG/ACT nasal spray Place 1 spray (20 mg total) into the nose every 2 (two) hours as needed for migraine or headache. May repeat in 2 hours if headache persists or recurs. 6 each 11   topiramate (TOPAMAX) 50 MG tablet Take 1 tablet (50 mg total) by mouth at bedtime. 90 tablet 3   No current facility-administered medications on file prior to visit.     Review of Systems  Constitutional:  Negative for activity change, appetite change, fatigue, fever and unexpected weight change.  HENT:  Negative for congestion, ear pain, rhinorrhea, sinus pressure and sore throat.   Eyes:  Negative for pain, redness and visual  disturbance.  Respiratory:  Negative for cough, shortness of breath and wheezing.   Cardiovascular:  Negative for chest pain and palpitations.  Gastrointestinal:  Negative for abdominal pain, blood in stool, constipation and diarrhea.  Endocrine: Negative for polydipsia and polyuria.  Genitourinary:  Negative for dysuria, frequency and urgency.  Musculoskeletal:  Negative for arthralgias, back pain and myalgias.  Skin:  Negative for pallor and rash.  Allergic/Immunologic: Negative for environmental allergies.  Neurological:  Positive for numbness. Negative for dizziness, tremors, seizures, syncope, facial asymmetry, speech difficulty, weakness, light-headedness and headaches.  Hematological:  Negative for adenopathy. Does not bruise/bleed easily.  Psychiatric/Behavioral:  Negative for decreased concentration and dysphoric mood. The patient is not nervous/anxious.       Objective:   Physical Exam Constitutional:      General: She is not in acute distress.    Appearance: Normal appearance. She is well-developed and normal weight.  HENT:     Head: Normocephalic and atraumatic.  Eyes:     Conjunctiva/sclera: Conjunctivae normal.     Pupils: Pupils are equal, round, and reactive to light.  Neck:     Thyroid: No thyromegaly.     Vascular: No carotid bruit or JVD.  Cardiovascular:     Rate and Rhythm: Normal rate and regular rhythm.     Heart sounds: Normal heart sounds.    No gallop.  Pulmonary:     Effort: Pulmonary effort is normal. No respiratory distress.     Breath sounds: Normal breath sounds. No wheezing or rales.  Abdominal:     General: There is no distension or abdominal bruit.     Palpations: Abdomen is soft.  Musculoskeletal:     Cervical back: Normal range of motion and neck supple.     Right lower leg: No edema.     Left lower leg: No edema.     Comments: No rom LS  Nl rom bilat hips  Neg SLR bilat   Lymphadenopathy:     Cervical: No cervical adenopathy.   Skin:    General: Skin is warm and dry.     Coloration: Skin is not pale.     Findings: No rash.     Comments: R leg (shin and top of foot- L5 dermatome) - reduced sensation to soft touch and sharp sensation Hypersensitive to cold   Nl gait  No motor deficits   Neurological:     Mental Status: She is alert.     Coordination: Coordination normal.     Deep Tendon Reflexes: Reflexes are normal and symmetric. Reflexes normal.  Psychiatric:        Mood and Affect: Mood normal.          Assessment & Plan:   Problem List Items Addressed This Visit       Other   B12 deficiency    Oral supplementation   Lab Results  Component Value Date   VITAMINB12 1,190 (H) 10/31/2021          Paresthesia of left leg - Primary    L ant shin and top of foot  Correlates with L5 dermatome  Nl exam/no skin changes No motor changes Disc poss of radiculoapthy/ lumbar? LS xray ordered  Recommend walking  Watch for any worse symptoms or change in strength or back pain        Relevant Orders   DG Lumbar Spine Complete

## 2022-05-14 NOTE — Patient Instructions (Signed)
Let's get xray of your low back  This may be a pinched nerve of some sore   If symptoms worsen let us know  Try to do some walking

## 2022-05-16 ENCOUNTER — Encounter: Payer: Self-pay | Admitting: Family Medicine

## 2022-05-19 MED ORDER — PREDNISONE 10 MG PO TABS: ORAL_TABLET | ORAL | 0 refills | Status: DC

## 2022-05-19 NOTE — Addendum Note (Signed)
Addended by: Loura Pardon A on: 05/19/2022 11:20 AM   Modules accepted: Orders

## 2022-06-27 DIAGNOSIS — L650 Telogen effluvium: Secondary | ICD-10-CM | POA: Diagnosis not present

## 2022-06-27 DIAGNOSIS — D1801 Hemangioma of skin and subcutaneous tissue: Secondary | ICD-10-CM | POA: Diagnosis not present

## 2022-06-27 DIAGNOSIS — L820 Inflamed seborrheic keratosis: Secondary | ICD-10-CM | POA: Diagnosis not present

## 2022-06-27 DIAGNOSIS — Z85828 Personal history of other malignant neoplasm of skin: Secondary | ICD-10-CM | POA: Diagnosis not present

## 2022-06-27 DIAGNOSIS — L821 Other seborrheic keratosis: Secondary | ICD-10-CM | POA: Diagnosis not present

## 2022-06-27 DIAGNOSIS — L814 Other melanin hyperpigmentation: Secondary | ICD-10-CM | POA: Diagnosis not present

## 2022-07-08 ENCOUNTER — Other Ambulatory Visit: Payer: Self-pay | Admitting: Family Medicine

## 2022-09-05 ENCOUNTER — Telehealth: Payer: Self-pay | Admitting: Family Medicine

## 2022-09-05 ENCOUNTER — Other Ambulatory Visit (INDEPENDENT_AMBULATORY_CARE_PROVIDER_SITE_OTHER): Payer: Medicare HMO

## 2022-09-05 ENCOUNTER — Ambulatory Visit (INDEPENDENT_AMBULATORY_CARE_PROVIDER_SITE_OTHER): Payer: Medicare HMO

## 2022-09-05 DIAGNOSIS — Z23 Encounter for immunization: Secondary | ICD-10-CM

## 2022-09-05 DIAGNOSIS — R7303 Prediabetes: Secondary | ICD-10-CM | POA: Diagnosis not present

## 2022-09-05 DIAGNOSIS — R5382 Chronic fatigue, unspecified: Secondary | ICD-10-CM

## 2022-09-05 DIAGNOSIS — E538 Deficiency of other specified B group vitamins: Secondary | ICD-10-CM

## 2022-09-05 DIAGNOSIS — E559 Vitamin D deficiency, unspecified: Secondary | ICD-10-CM

## 2022-09-05 LAB — COMPREHENSIVE METABOLIC PANEL
ALT: 14 U/L (ref 0–35)
AST: 14 U/L (ref 0–37)
Albumin: 4.4 g/dL (ref 3.5–5.2)
Alkaline Phosphatase: 57 U/L (ref 39–117)
BUN: 26 mg/dL — ABNORMAL HIGH (ref 6–23)
CO2: 33 mEq/L — ABNORMAL HIGH (ref 19–32)
Calcium: 9.2 mg/dL (ref 8.4–10.5)
Chloride: 99 mEq/L (ref 96–112)
Creatinine, Ser: 0.71 mg/dL (ref 0.40–1.20)
GFR: 89.17 mL/min (ref >60.00)
Glucose, Bld: 82 mg/dL (ref 70–99)
Potassium: 3.9 mEq/L (ref 3.5–5.1)
Sodium: 139 mEq/L (ref 135–145)
Total Bilirubin: 0.6 mg/dL (ref 0.2–1.2)
Total Protein: 6.7 g/dL (ref 6.0–8.3)

## 2022-09-05 LAB — CBC WITH DIFFERENTIAL/PLATELET
Basophils Absolute: 0 10*3/uL (ref 0.0–0.1)
Basophils Relative: 0.7 % (ref 0.0–3.0)
Eosinophils Absolute: 0.1 10*3/uL (ref 0.0–0.7)
Eosinophils Relative: 1.2 % (ref 0.0–5.0)
HCT: 37.4 % (ref 36.0–46.0)
Hemoglobin: 13 g/dL (ref 12.0–15.0)
Lymphocytes Relative: 22.8 % (ref 12.0–46.0)
Lymphs Abs: 1.2 10*3/uL (ref 0.7–4.0)
MCHC: 34.8 g/dL (ref 30.0–36.0)
MCV: 89.4 fl (ref 78.0–100.0)
Monocytes Absolute: 0.4 10*3/uL (ref 0.1–1.0)
Monocytes Relative: 8 % (ref 3.0–12.0)
Neutro Abs: 3.4 10*3/uL (ref 1.4–7.7)
Neutrophils Relative %: 67.3 % (ref 43.0–77.0)
Platelets: 211 10*3/uL (ref 150.0–400.0)
RBC: 4.19 Mil/uL (ref 3.87–5.11)
RDW: 13.1 % (ref 11.5–15.5)
WBC: 5.1 10*3/uL (ref 4.0–10.5)

## 2022-09-05 LAB — HEMOGLOBIN A1C: Hgb A1c MFr Bld: 5.1 % (ref 4.6–6.5)

## 2022-09-05 LAB — IRON: Iron: 72 ug/dL (ref 42–145)

## 2022-09-05 NOTE — Telephone Encounter (Signed)
Pt was seen today for labs and flu shot and advised of this info and f/u appt scheduled with PCP

## 2022-09-05 NOTE — Telephone Encounter (Signed)
I put orders in for fatigue  Have her f/u next week  I cannot absolutely promise that insurance will pay for all the labs but I ordered what I usually do

## 2022-09-05 NOTE — Telephone Encounter (Signed)
Added lab appt today. Called pt and no answer so Left VM requesting pt to call the office back

## 2022-09-05 NOTE — Telephone Encounter (Signed)
Pt said she has been exercising and walking and just does not feel energized like she thought she would; feeling tired. No other symptoms and pt said has hx of low Vitamin B 12. Pt not talking B 12 supplement now.last B 12 lab 10/31/2021 and was 1190. Pt last saw Dr Glori Bickers on 05/14/2022.Pt has appt 09/05/22 at 11:45 for flu shot and wondered if could get some lab work done while she was here. Pt request cb when reviewed by Dr Glori Bickers. Sending note to Dr Glori Bickers, [facility], [place] CMA and will teams [place].

## 2022-09-06 LAB — VITAMIN D 25 HYDROXY (VIT D DEFICIENCY, FRACTURES): VITD: 46.83 ng/mL (ref 30.00–100.00)

## 2022-09-06 LAB — TSH: TSH: 2.03 u[IU]/mL (ref 0.35–5.50)

## 2022-09-06 LAB — T4, FREE: Free T4: 0.78 ng/dL (ref 0.60–1.60)

## 2022-09-06 LAB — VITAMIN B12: Vitamin B-12: 1324 pg/mL — ABNORMAL HIGH (ref 211–911)

## 2022-09-11 ENCOUNTER — Ambulatory Visit (INDEPENDENT_AMBULATORY_CARE_PROVIDER_SITE_OTHER): Payer: Medicare HMO | Admitting: Family Medicine

## 2022-09-11 ENCOUNTER — Encounter: Payer: Self-pay | Admitting: Family Medicine

## 2022-09-11 VITALS — BP 122/68 | HR 78 | Temp 97.8°F | Ht 66.0 in | Wt 173.1 lb

## 2022-09-11 DIAGNOSIS — R7303 Prediabetes: Secondary | ICD-10-CM | POA: Diagnosis not present

## 2022-09-11 DIAGNOSIS — E559 Vitamin D deficiency, unspecified: Secondary | ICD-10-CM | POA: Diagnosis not present

## 2022-09-11 DIAGNOSIS — G4733 Obstructive sleep apnea (adult) (pediatric): Secondary | ICD-10-CM | POA: Diagnosis not present

## 2022-09-11 DIAGNOSIS — E538 Deficiency of other specified B group vitamins: Secondary | ICD-10-CM

## 2022-09-11 DIAGNOSIS — R5382 Chronic fatigue, unspecified: Secondary | ICD-10-CM

## 2022-09-11 DIAGNOSIS — F418 Other specified anxiety disorders: Secondary | ICD-10-CM

## 2022-09-11 NOTE — Assessment & Plan Note (Signed)
Pt takes ppi Lab Results  Component Value Date   VITAMINB12 1,324 (H) 09/05/2022   She takes oral suppl twice weekly  inst to cut this in 1/2 since level is high

## 2022-09-11 NOTE — Progress Notes (Signed)
Subjective:    Patient ID: Pamela Anthony, female    DOB: 11-30-1956, 66 y.o.   MRN: [id number]  HPI Pt presents for fatigue  Wt Readings from Last 3 Encounters:  09/11/22 173 lb 2 oz (78.5 kg)  05/14/22 170 lb (77.1 kg)  04/17/22 169 lb (76.7 kg)   27.94 kg/m  Had a lot of tiredness (just a little sinus pressure) -for about a week  No respiratory symptoms or fever   Was more weary than sleepy  Just dragging  Starting to feel better now !   Had a high dose flu shot- it made her feel terrible / wiped out   Now she is feeling better  Had to leave work next day  Notes she has been exercising but not getting more energy from  it  H/o low b12  She does take a ppi/omeprazole   Mood  Fluoxetine 40 mg daily  Not anxious or down  Work is usually stressful but no changes   Doing a walking routine in the am  Going to bed earlier and getting up earlier   Sleep - usually 7 hours  Feels rested usually  Does snore a little but no concern for apnea any more  No longer needs cpap after wt loss    Labs Results for orders placed or performed in visit on 09/05/22  VITAMIN D 25 Hydroxy (Vit-D Deficiency, Fractures)  Result Value Ref Range   VITD 46.83 30.00 - 100.00 ng/mL  Hemoglobin A1c  Result Value Ref Range   Hgb A1c MFr Bld 5.1 4.6 - 6.5 %  Comprehensive metabolic panel  Result Value Ref Range   Sodium 139 135 - 145 mEq/L   Potassium 3.9 3.5 - 5.1 mEq/L   Chloride 99 96 - 112 mEq/L   CO2 33 (H) 19 - 32 mEq/L   Glucose, Bld 82 70 - 99 mg/dL   BUN 26 (H) 6 - 23 mg/dL   Creatinine, Ser 0.71 0.40 - 1.20 mg/dL   Total Bilirubin 0.6 0.2 - 1.2 mg/dL   Alkaline Phosphatase 57 39 - 117 U/L   AST 14 0 - 37 U/L   ALT 14 0 - 35 U/L   Total Protein 6.7 6.0 - 8.3 g/dL   Albumin 4.4 3.5 - 5.2 g/dL   GFR 89.17 >60.00 mL/min   Calcium 9.2 8.4 - 10.5 mg/dL  TSH  Result Value Ref Range   TSH 2.03 0.35 - 5.50 uIU/mL  Vitamin B12  Result Value Ref Range   Vitamin B-12 1,324 (H)  211 - 911 pg/mL  CBC with Differential/Platelet  Result Value Ref Range   WBC 5.1 4.0 - 10.5 K/uL   RBC 4.19 3.87 - 5.11 Mil/uL   Hemoglobin 13.0 12.0 - 15.0 g/dL   HCT 37.4 36.0 - 46.0 %   MCV 89.4 78.0 - 100.0 fl   MCHC 34.8 30.0 - 36.0 g/dL   RDW 13.1 11.5 - 15.5 %   Platelets 211.0 150.0 - 400.0 K/uL   Neutrophils Relative % 67.3 43.0 - 77.0 %   Lymphocytes Relative 22.8 12.0 - 46.0 %   Monocytes Relative 8.0 3.0 - 12.0 %   Eosinophils Relative 1.2 0.0 - 5.0 %   Basophils Relative 0.7 0.0 - 3.0 %   Neutro Abs 3.4 1.4 - 7.7 K/uL   Lymphs Abs 1.2 0.7 - 4.0 K/uL   Monocytes Absolute 0.4 0.1 - 1.0 K/uL   Eosinophils Absolute 0.1 0.0 - 0.7 K/uL   Basophils  Absolute 0.0 0.0 - 0.1 K/uL  Iron  Result Value Ref Range   Iron 72 42 - 145 ug/dL  T4, free  Result Value Ref Range   Free T4 0.78 0.60 - 1.60 ng/dL    Patient Active Problem List   Diagnosis Date Noted   Paresthesia of left leg 05/14/2022   Vaso-vagal reaction 03/29/2022   Encounter for screening for HIV 10/31/2021   Encounter for hepatitis C screening test for low risk patient 10/31/2021   Elevated antinuclear antibody (ANA) level 09/20/2020   Candidal intertrigo 04/28/2020   Colon cancer screening 03/18/2018   Fatigue 03/18/2018   Prediabetes 03/16/2015   Encounter for routine gynecological examination 09/01/2013   Depression with anxiety 01/18/2013   Post-menopausal 08/14/2012   Other screening mammogram 04/29/2011   Routine general medical examination at a health care facility 04/22/2011   Joint pain 02/28/2010   Myofascial pain syndrome 02/28/2010   Vitamin D deficiency 03/08/2009   IRRITABLE BOWEL SYNDROME 06/27/2008   B12 deficiency 02/23/2008   POLYP, COLON 03/06/2007   Hyperlipidemia 03/06/2007   GERD 03/06/2007   HIATAL HERNIA 03/06/2007   OSA (obstructive sleep apnea) 03/06/2007   URINARY INCONTINENCE 03/06/2007   Migraine 03/06/2007   Past Medical History:  Diagnosis Date   Allergic rhinitis     Allergy    Anxiety    B12 deficiency    Degenerative disc disease    back, knee   Family history of adverse reaction to anesthesia    Fungal infection 2005   lamasil    GERD (gastroesophageal reflux disease)    EGD 10/2004   Hemorrhoids, internal    colonoscopy 02/2008   Hyperlipemia    Iron deficiency anemia    Low back pain    Migraine headache    improved with medications   Obesity    Orthodontics    [facility]   Sleep apnea    no cpap   Urinary incontinence    Vaso-vagal reaction    occured after eye drops for cataract surgery, but pt reports this has happened before during medical procedures   Vitamin D deficiency    mild   Past Surgical History:  Procedure Laterality Date   CATARACT EXTRACTION W/PHACO Left 03/25/2022   Procedure: CATARACT EXTRACTION PHACO AND INTRAOCULAR LENS PLACEMENT ([facility]) LEFT;  Surgeon: Leandrew Koyanagi, MD;  Location: [facility];  Service: Ophthalmology;  Laterality: Left;  8.88 1:13.0   CATARACT EXTRACTION W/PHACO Right 04/17/2022   Procedure: CATARACT EXTRACTION PHACO AND INTRAOCULAR LENS PLACEMENT ([facility]) RIGHT 7.22 01:22.1;  Surgeon: Leandrew Koyanagi, MD;  Location: [facility];  Service: Ophthalmology;  Laterality: Right;  sleep apnea   [facility] OF UTERUS  2005   HAND SURGERY  2000   nodule removed   KNEE SURGERY  1970's   right   NASAL SINUS SURGERY  2000's   right maxillary   TONSILLECTOMY AND ADENOIDECTOMY     Social History   Tobacco Use   Smoking status: Never   Smokeless tobacco: Never  Vaping Use   Vaping Use: Never used  Substance Use Topics   Alcohol use: Yes    Alcohol/week: 0.0 standard drinks of alcohol    Comment: occasional   Drug use: No   Family History  Problem Relation Age of Onset   Coronary artery disease Father        in his 54's   Arthritis Father  Stroke Father    Heart disease Father        pacemaker   Prostate  cancer Father    Breast cancer Maternal Grandmother    Colon cancer Other    Breast cancer Cousin        mat cousin   Colon polyps Neg Hx    Allergies  Allergen Reactions   Atorvastatin     REACTION: myalgias   Fluogen [Influenza Virus Vaccine]     High dose  Felt sick/achy    Oxybutynin     REACTION: rash with  patch    Current Outpatient Medications on File Prior to Visit  Medication Sig Dispense Refill   Cholecalciferol (VITAMIN D-3 PO) Take 1 tablet by mouth daily.     Cyanocobalamin (B-12 PO) Take by mouth daily.     FLUoxetine (PROZAC) 20 MG capsule Take 3 capsules (60 mg total) by mouth daily. (Patient taking differently: Take 40 mg by mouth daily.) 270 capsule 3   fluticasone (FLONASE) 50 MCG/ACT nasal spray USE 2 SPRAYS IN EACH NOSTRIL EVERY DAY 16 g 6   ketoconazole (NIZORAL) 2 % cream Apply 1 application topically daily. 30 g 1   loratadine (CLARITIN) 10 MG tablet Take 1 tablet (10 mg total) by mouth daily. 90 tablet 3   mometasone (ELOCON) 0.1 % cream   0   omeprazole (PRILOSEC) 20 MG capsule Take 1 capsule (20 mg total) by mouth daily. 90 capsule 3   oxybutynin (DITROPAN-XL) 10 MG 24 hr tablet Take 1 tablet (10 mg total) by mouth at bedtime. 90 tablet 3   Probiotic Product (ALIGN PO) Take by mouth daily.     rosuvastatin (CRESTOR) 10 MG tablet Take 1 tablet (10 mg total) by mouth daily. 90 tablet 3   SUMAtriptan (IMITREX) 20 MG/ACT nasal spray Place 1 spray (20 mg total) into the nose every 2 (two) hours as needed for migraine or headache. May repeat in 2 hours if headache persists or recurs. 6 each 11   topiramate (TOPAMAX) 50 MG tablet Take 1 tablet (50 mg total) by mouth at bedtime. 90 tablet 3   No current facility-administered medications on file prior to visit.    Review of Systems  Constitutional:  Positive for fatigue. Negative for activity change, appetite change, fever and unexpected weight change.  HENT:  Negative for congestion, ear pain, rhinorrhea,  sinus pressure and sore throat.   Eyes:  Negative for pain, redness and visual disturbance.  Respiratory:  Negative for cough, shortness of breath and wheezing.   Cardiovascular:  Negative for chest pain and palpitations.  Gastrointestinal:  Negative for abdominal pain, blood in stool, constipation and diarrhea.  Endocrine: Negative for polydipsia and polyuria.  Genitourinary:  Negative for dysuria, frequency and urgency.  Musculoskeletal:  Negative for arthralgias, back pain and myalgias.  Skin:  Negative for pallor and rash.  Allergic/Immunologic: Negative for environmental allergies.  Neurological:  Negative for dizziness, syncope and headaches.  Hematological:  Negative for adenopathy. Does not bruise/bleed easily.  Psychiatric/Behavioral:  Negative for decreased concentration and dysphoric mood. The patient is not nervous/anxious.        Objective:   Physical Exam Constitutional:      General: She is not in acute distress.    Appearance: Normal appearance. She is well-developed and normal weight. She is not ill-appearing or diaphoretic.  HENT:     Head: Normocephalic and atraumatic.     Right Ear: Tympanic membrane, ear canal and external ear normal.  Left Ear: Tympanic membrane, ear canal and external ear normal.     Nose: Nose normal.     Mouth/Throat:     Mouth: Mucous membranes are moist.     Pharynx: Oropharynx is clear.  Eyes:     General: No scleral icterus.       Right eye: No discharge.        Left eye: No discharge.     Conjunctiva/sclera: Conjunctivae normal.     Pupils: Pupils are equal, round, and reactive to light.  Neck:     Thyroid: No thyromegaly.     Vascular: No carotid bruit or JVD.  Cardiovascular:     Rate and Rhythm: Normal rate and regular rhythm.     Pulses: Normal pulses.     Heart sounds: Normal heart sounds.     No gallop.  Pulmonary:     Effort: Pulmonary effort is normal. No respiratory distress.     Breath sounds: Normal breath  sounds. No wheezing or rales.  Abdominal:     General: There is no distension or abdominal bruit.     Palpations: Abdomen is soft. There is no mass.     Tenderness: There is no abdominal tenderness. There is no guarding or rebound.  Musculoskeletal:     Cervical back: Normal range of motion and neck supple. No tenderness.     Right lower leg: No edema.     Left lower leg: No edema.  Lymphadenopathy:     Cervical: No cervical adenopathy.  Skin:    General: Skin is warm and dry.     Coloration: Skin is not jaundiced or pale.     Findings: No bruising or rash.  Neurological:     Mental Status: She is alert.     Cranial Nerves: No cranial nerve deficit.     Coordination: Coordination normal.     Deep Tendon Reflexes: Reflexes are normal and symmetric. Reflexes normal.  Psychiatric:        Mood and Affect: Mood normal.           Assessment & Plan:   Problem List Items Addressed This Visit       Respiratory   OSA (obstructive sleep apnea)    After significant weight loss-no longer needs/uses cpap         Other   B12 deficiency    Pt takes ppi Lab Results  Component Value Date   VITAMINB12 1,324 (H) 09/05/2022   She takes oral suppl twice weekly  inst to cut this in 1/2 since level is high       Depression with anxiety    Per pt doing well  On fluoxetine 40 mg daily  Good self care       Fatigue - Primary    Pt had a period of moderate fatigue (more weary than sleepy, not nodding off)  This is improved/lasted about a week  Now improving  Nl exam Labs are all reassuring  She did react to high dose flu shot- better now  Will continue to monitor  H/o OSA but no longer needs cpap since loosing wt Enc good self care with balanced diet and exercise  Mood is good as well       Prediabetes    Very good control with better diet and wt loss Lab Results  Component Value Date   HGBA1C 5.1 09/05/2022   Enc her to continue low glycemic diet and exercise  Vitamin D deficiency    D level of 46.8 Vitamin D level is therapeutic with current supplementation Disc importance of this to bone and overall health

## 2022-09-11 NOTE — Patient Instructions (Addendum)
Cut your B12 supplement dose in 1/2   You may have had a virus Drink lots of fluids  Get rest  Keep exercising   Labs look good

## 2022-09-11 NOTE — Assessment & Plan Note (Signed)
Pt had a period of moderate fatigue (more weary than sleepy, not nodding off)  This is improved/lasted about a week  Now improving  Nl exam Labs are all reassuring  She did react to high dose flu shot- better now  Will continue to monitor  H/o OSA but no longer needs cpap since loosing wt Enc good self care with balanced diet and exercise  Mood is good as well

## 2022-09-11 NOTE — Assessment & Plan Note (Signed)
Very good control with better diet and wt loss Lab Results  Component Value Date   HGBA1C 5.1 09/05/2022   Enc her to continue low glycemic diet and exercise

## 2022-09-11 NOTE — Assessment & Plan Note (Signed)
Per pt doing well  On fluoxetine 40 mg daily  Good self care

## 2022-09-11 NOTE — Assessment & Plan Note (Signed)
After significant weight loss-no longer needs/uses cpap

## 2022-09-11 NOTE — Assessment & Plan Note (Signed)
D level of 46.8 Vitamin D level is therapeutic with current supplementation Disc importance of this to bone and overall health

## 2022-09-19 ENCOUNTER — Other Ambulatory Visit: Payer: Self-pay | Admitting: Family Medicine

## 2022-09-19 DIAGNOSIS — Z1231 Encounter for screening mammogram for malignant neoplasm of breast: Secondary | ICD-10-CM

## 2022-10-09 ENCOUNTER — Ambulatory Visit
Admission: RE | Admit: 2022-10-09 | Discharge: 2022-10-09 | Disposition: A | Discharge status: Home / Self Care | Payer: Medicare HMO | Source: Ambulatory Visit | Attending: Family Medicine | Admitting: Family Medicine

## 2022-10-09 DIAGNOSIS — Z1231 Encounter for screening mammogram for malignant neoplasm of breast: Secondary | ICD-10-CM | POA: Insufficient documentation

## 2022-10-27 ENCOUNTER — Telehealth: Payer: Self-pay | Admitting: Family Medicine

## 2022-10-27 DIAGNOSIS — E559 Vitamin D deficiency, unspecified: Secondary | ICD-10-CM

## 2022-10-27 DIAGNOSIS — E538 Deficiency of other specified B group vitamins: Secondary | ICD-10-CM

## 2022-10-27 DIAGNOSIS — E7800 Pure hypercholesterolemia, unspecified: Secondary | ICD-10-CM

## 2022-10-27 DIAGNOSIS — R7303 Prediabetes: Secondary | ICD-10-CM

## 2022-10-27 DIAGNOSIS — R5382 Chronic fatigue, unspecified: Secondary | ICD-10-CM

## 2022-10-27 NOTE — Telephone Encounter (Signed)
-----   Message from Velna Hatchet, RT sent at 10/21/2022 11:09 AM EST ----- Regarding: Mon 11/20 lab order Patient is scheduled for cpx, please order future labs.  Thanks, Anda Kraft

## 2022-10-28 ENCOUNTER — Other Ambulatory Visit (INDEPENDENT_AMBULATORY_CARE_PROVIDER_SITE_OTHER): Payer: Medicare HMO

## 2022-10-28 DIAGNOSIS — E538 Deficiency of other specified B group vitamins: Secondary | ICD-10-CM | POA: Diagnosis not present

## 2022-10-28 DIAGNOSIS — E559 Vitamin D deficiency, unspecified: Secondary | ICD-10-CM | POA: Diagnosis not present

## 2022-10-28 DIAGNOSIS — R5382 Chronic fatigue, unspecified: Secondary | ICD-10-CM | POA: Diagnosis not present

## 2022-10-28 DIAGNOSIS — R7303 Prediabetes: Secondary | ICD-10-CM

## 2022-10-28 DIAGNOSIS — E7800 Pure hypercholesterolemia, unspecified: Secondary | ICD-10-CM

## 2022-10-28 LAB — LIPID PANEL
Cholesterol: 126 mg/dL (ref 0–200)
HDL: 48.1 mg/dL (ref >39.00)
LDL Cholesterol: 66 mg/dL (ref 0–99)
NonHDL: 77.7
Total CHOL/HDL Ratio: 3
Triglycerides: 57 mg/dL (ref 0.0–149.0)
VLDL: 11.4 mg/dL (ref 0.0–40.0)

## 2022-10-28 LAB — HEMOGLOBIN A1C: Hgb A1c MFr Bld: 5 % (ref 4.6–6.5)

## 2022-10-28 LAB — COMPREHENSIVE METABOLIC PANEL
ALT: 12 U/L (ref 0–35)
AST: 14 U/L (ref 0–37)
Albumin: 4.2 g/dL (ref 3.5–5.2)
Alkaline Phosphatase: 47 U/L (ref 39–117)
BUN: 19 mg/dL (ref 6–23)
CO2: 31 mEq/L (ref 19–32)
Calcium: 8.6 mg/dL (ref 8.4–10.5)
Chloride: 106 mEq/L (ref 96–112)
Creatinine, Ser: 0.67 mg/dL (ref 0.40–1.20)
GFR: 91.57 mL/min (ref >60.00)
Glucose, Bld: 95 mg/dL (ref 70–99)
Potassium: 3.8 mEq/L (ref 3.5–5.1)
Sodium: 144 mEq/L (ref 135–145)
Total Bilirubin: 0.5 mg/dL (ref 0.2–1.2)
Total Protein: 6.2 g/dL (ref 6.0–8.3)

## 2022-10-28 LAB — CBC WITH DIFFERENTIAL/PLATELET
Basophils Absolute: 0 10*3/uL (ref 0.0–0.1)
Basophils Relative: 0.8 % (ref 0.0–3.0)
Eosinophils Absolute: 0.1 10*3/uL (ref 0.0–0.7)
Eosinophils Relative: 2.2 % (ref 0.0–5.0)
HCT: 37.5 % (ref 36.0–46.0)
Hemoglobin: 12.9 g/dL (ref 12.0–15.0)
Lymphocytes Relative: 28.8 % (ref 12.0–46.0)
Lymphs Abs: 0.9 10*3/uL (ref 0.7–4.0)
MCHC: 34.3 g/dL (ref 30.0–36.0)
MCV: 90.8 fl (ref 78.0–100.0)
Monocytes Absolute: 0.2 10*3/uL (ref 0.1–1.0)
Monocytes Relative: 7.9 % (ref 3.0–12.0)
Neutro Abs: 1.9 10*3/uL (ref 1.4–7.7)
Neutrophils Relative %: 60.3 % (ref 43.0–77.0)
Platelets: 212 10*3/uL (ref 150.0–400.0)
RBC: 4.13 Mil/uL (ref 3.87–5.11)
RDW: 13.5 % (ref 11.5–15.5)
WBC: 3.1 10*3/uL — ABNORMAL LOW (ref 4.0–10.5)

## 2022-10-28 LAB — VITAMIN D 25 HYDROXY (VIT D DEFICIENCY, FRACTURES): VITD: 66 ng/mL (ref 30.00–100.00)

## 2022-10-28 LAB — VITAMIN B12: Vitamin B-12: 731 pg/mL (ref 211–911)

## 2022-10-28 LAB — TSH: TSH: 2.65 u[IU]/mL (ref 0.35–5.50)

## 2022-11-04 ENCOUNTER — Ambulatory Visit (INDEPENDENT_AMBULATORY_CARE_PROVIDER_SITE_OTHER): Payer: Medicare HMO | Admitting: Family Medicine

## 2022-11-04 ENCOUNTER — Encounter: Payer: Self-pay | Admitting: Family Medicine

## 2022-11-04 VITALS — BP 94/60 | HR 65 | Temp 97.9°F | Ht 66.0 in | Wt 174.0 lb

## 2022-11-04 DIAGNOSIS — E7800 Pure hypercholesterolemia, unspecified: Secondary | ICD-10-CM

## 2022-11-04 DIAGNOSIS — R7303 Prediabetes: Secondary | ICD-10-CM

## 2022-11-04 DIAGNOSIS — Z1211 Encounter for screening for malignant neoplasm of colon: Secondary | ICD-10-CM | POA: Diagnosis not present

## 2022-11-04 DIAGNOSIS — Z23 Encounter for immunization: Secondary | ICD-10-CM

## 2022-11-04 DIAGNOSIS — E559 Vitamin D deficiency, unspecified: Secondary | ICD-10-CM | POA: Diagnosis not present

## 2022-11-04 DIAGNOSIS — E538 Deficiency of other specified B group vitamins: Secondary | ICD-10-CM

## 2022-11-04 DIAGNOSIS — E2839 Other primary ovarian failure: Secondary | ICD-10-CM

## 2022-11-04 DIAGNOSIS — Z0000 Encounter for general adult medical examination without abnormal findings: Secondary | ICD-10-CM | POA: Insufficient documentation

## 2022-11-04 DIAGNOSIS — F418 Other specified anxiety disorders: Secondary | ICD-10-CM | POA: Diagnosis not present

## 2022-11-04 DIAGNOSIS — R5382 Chronic fatigue, unspecified: Secondary | ICD-10-CM

## 2022-11-04 DIAGNOSIS — K219 Gastro-esophageal reflux disease without esophagitis: Secondary | ICD-10-CM

## 2022-11-04 NOTE — Patient Instructions (Addendum)
Add some strength training to your exercise   Pneumonia vaccine today   Go ahead and take the omeprazole every other day instead of daily  If the rebound is severe - go back to daily  We will see if this can be gradually reduced  Also watch the foods that make reflux worse   Call the [facility] to schedule your bone density test / DEXA  Please call the location of your choice from the menu below to schedule your Mammogram and/or Bone Density appointment.    [facility]                      Phone:  [phone], [place] [address]                                                             Services: Traditional and 3D Mammogram, [facility]ensity                 Phone: [phone] [address], [place] [address]    Service: Bone Density ONLY   *this site does NOT perform mammograms  [facility]                        Phone:  [phone] [address], [place] [address]                                            Services:  3D Mammogram and [place] at [facility]   Phone:  [phone]   [address], [place] [address]  Services: 3D Mammogram and Bone Density  [facility] [place] ([facility])  Phone:  [phone]   [address]                        [place], [place] [address]                                              Services:  3D Mammogram and Bone Density  

## 2022-11-04 NOTE — Assessment & Plan Note (Signed)
Taking omeprazole 20 mg daily and well controlled  Did loose wt and eating better  Inst to try taking every other day and report back if rebound symptoms last longer than a week  Would like to eventually get off of this   B12 level and D level are normal

## 2022-11-04 NOTE — Progress Notes (Unsigned)
Subjective:    Patient ID: Pamela Anthony, female    DOB: 1956/01/31, 66 y.o.   MRN: [id number]  HPI I have personally reviewed the Medicare Annual Wellness questionnaire and have noted 1. The patient's medical and social history 2. Their use of alcohol, tobacco or illicit drugs 3. Their current medications and supplements 4. The patient's functional ability including ADL's, fall risks, home safety risks and hearing or visual             impairment. 5. Diet and physical activities 6. Evidence for depression or mood disorders  The patients weight, height, BMI have been recorded in the chart and visual acuity is per eye clinic.  I have made referrals, counseling and provided education to the patient based review of the above and I have provided the pt with a written personalized care plan for preventive services. Reviewed and updated provider list, see scanned forms.  See scanned forms.  Routine anticipatory guidance given to patient.  See health maintenance. Colon cancer screening : colonoscopy 04/2018 - 10 y recall  Breast cancer screening : mammogram 10/2022  Self breast exam: no lumps  Pap 10/2020 Flu vaccine utd Tetanus vaccine   Tdap 2015 Pneumovax: due for the prevar 20  Zoster vaccine: utd Dexa: 08/2012 , nl BMD   Falls: none  Fractures: none  Supplements : vitamin D  Gets calcium in diet  Vit D level is good at 66  Exercise : walking    Advance directive: given info to work on  Cognitive function addressed- see scanned forms- and if abnormal then additional documentation follows.   No problems  Occ misplaces things    PMH and SH reviewed  Meds, vitals, and allergies reviewed.   ROS: See HPI.  Otherwise negative.    Weight : Wt Readings from Last 3 Encounters:  11/04/22 174 lb (78.9 kg)  09/11/22 173 lb 2 oz (78.5 kg)  05/14/22 170 lb (77.1 kg)   28.08 kg/m  Keeping weight off Holidays are a challenge    Hearing/vision: Vision Screening   Right  eye Left eye Both eyes  Without correction '20/25 20/25 20/25 '$  With correction      Notes hearing changes with time but not very problematic    PHQ:    11/04/2022    9:23 AM 05/14/2022    3:00 PM 10/31/2021    2:45 PM 09/14/2020    3:31 PM 10/27/2019    4:49 PM  Depression screen PHQ 2/9  Decreased Interest 0 0 0 0 0  Down, Depressed, Hopeless 0 0 0 0 0  PHQ - 2 Score 0 0 0 0 0  Altered sleeping 0  0  0  Tired, decreased energy 0  0  0  Change in appetite 0  0  0  Feeling bad or failure about yourself  0  0  0  Trouble concentrating 0  0  0  Moving slowly or fidgety/restless 0  0  0  Suicidal thoughts 0  0  0  PHQ-9 Score 0  0  0  Difficult doing work/chores Not difficult at all  Not difficult at all  Not difficult at all      Dep/anx Takes fluoxetine 40 mg daily   ADLs: no help needed   Functionality: excellent   Care team " Krystyne [facility]-pcp [facility]- derm [place]- oph [facility]- GI  BP Readings from Last 3 Encounters:  11/04/22 94/60  09/11/22 122/68  05/14/22 104/66   Pulse Readings from Last  3 Encounters:  11/04/22 65  09/11/22 78  05/14/22 70    GERD: Omeprazole 20 mg daily  Has not tried holding    Vit B12 def Lab Results  Component Value Date   FEOFHQRF75 883 10/28/2022   Oral supplement -only taking it once per week  Hyperlipidemia Lab Results  Component Value Date   CHOL 126 10/28/2022   CHOL 113 10/31/2021   CHOL 223 (H) 10/23/2020   Lab Results  Component Value Date   HDL 48.10 10/28/2022   HDL 39 (L) 10/31/2021   HDL 43.50 10/23/2020   Lab Results  Component Value Date   LDLCALC 66 10/28/2022   LDLCALC 59 10/31/2021   LDLCALC 151 (H) 10/23/2020   Lab Results  Component Value Date   TRIG 57.0 10/28/2022   TRIG 73 10/31/2021   TRIG 142.0 10/23/2020   Lab Results  Component Value Date   CHOLHDL 3 10/28/2022   CHOLHDL 2.9 10/31/2021   CHOLHDL 5 10/23/2020   Lab Results  Component Value Date   LDLDIRECT 182.2  08/25/2013   LDLDIRECT 183.6 08/07/2012   LDLDIRECT 170.8 04/23/2011   Creestor 10 mg daily  LDL 66  Well controlled  Eats very well most of the time   Prediabetes Lab Results  Component Value Date   HGBA1C 5.0 10/28/2022   This is stable and well controlled  Very good    Other labs Lab Results  Component Value Date   WBC 3.1 (L) 10/28/2022   HGB 12.9 10/28/2022   HCT 37.5 10/28/2022   MCV 90.8 10/28/2022   PLT 212.0 10/28/2022   Lab Results  Component Value Date   IRON 72 09/05/2022   FERRITIN 4.4 (L) 09/05/2008   Lab Results  Component Value Date   TSH 2.65 10/28/2022   Lab Results  Component Value Date   CREATININE 0.67 10/28/2022   BUN 19 10/28/2022   NA 144 10/28/2022   K 3.8 10/28/2022   CL 106 10/28/2022   CO2 31 10/28/2022   Lab Results  Component Value Date   ALT 12 10/28/2022   AST 14 10/28/2022   ALKPHOS 47 10/28/2022   BILITOT 0.5 10/28/2022     Fatigue is better now  Thinks she may have had a virus   Review of Systems     Objective:   Physical Exam        Assessment & Plan:

## 2022-11-05 NOTE — Assessment & Plan Note (Signed)
Lab Results  Component Value Date   [id number] 412 10/28/2022   Continues oral b12 once weekly

## 2022-11-05 NOTE — Assessment & Plan Note (Signed)
Colonoscopy utd 2019

## 2022-11-05 NOTE — Assessment & Plan Note (Signed)
Lab Results  Component Value Date   HGBA1C 5.0 10/28/2022   disc imp of low glycemic diet and wt loss to prevent DM2

## 2022-11-05 NOTE — Assessment & Plan Note (Signed)
Improved  May have been viral

## 2022-11-05 NOTE — Assessment & Plan Note (Signed)
Reviewed health habits including diet and exercise and skin cancer prevention Reviewed appropriate screening tests for age  Also reviewed health mt list, fam hx and immunization status , as well as social and family history   See HPI Labs reviewed  Colonoscopy utd 2019 Mammogram utd 10/2022 Pap utd 10/2020 Pneumonia vaccine given-prevnar 20 Other vaccines utd  Dexa normal in 2013, new one ordered, no falls or fractures , taking vit D with high ca diet and walking for exercise  Given materials to work on advance directive No cognitive concerns Vision screen/ hearing reviewed  PHQ score of 0 Care team rev No help needed with ADLs , good functionality

## 2022-11-05 NOTE — Assessment & Plan Note (Signed)
Disc goals for lipids and reasons to control them Rev last labs with pt Rev low sat fat diet in detail Crestor 10 mg daily,, LDL is 66 and eating well

## 2022-11-05 NOTE — Assessment & Plan Note (Signed)
D level is therapeutic at 28 Vitamin D level is therapeutic with current supplementation Disc importance of this to bone and overall health

## 2022-11-05 NOTE — Assessment & Plan Note (Signed)
Doing well /stable with fluoxetine 40 meq  Phq score is zero   Enc good self care and exercise

## 2022-11-07 ENCOUNTER — Ambulatory Visit
Admission: RE | Admit: 2022-11-07 | Discharge: 2022-11-07 | Disposition: A | Discharge status: Home / Self Care | Payer: Medicare HMO | Source: Ambulatory Visit | Attending: Family Medicine | Admitting: Family Medicine

## 2022-11-07 DIAGNOSIS — E2839 Other primary ovarian failure: Secondary | ICD-10-CM | POA: Diagnosis not present

## 2022-11-07 DIAGNOSIS — Z780 Asymptomatic menopausal state: Secondary | ICD-10-CM | POA: Diagnosis not present

## 2022-11-07 DIAGNOSIS — M8589 Other specified disorders of bone density and structure, multiple sites: Secondary | ICD-10-CM | POA: Diagnosis not present

## 2022-11-08 DIAGNOSIS — H40003 Preglaucoma, unspecified, bilateral: Secondary | ICD-10-CM | POA: Diagnosis not present

## 2022-12-01 ENCOUNTER — Other Ambulatory Visit: Payer: Self-pay | Admitting: Family Medicine

## 2022-12-04 NOTE — Telephone Encounter (Signed)
Last office visit 11/04/22 Last refill on all 02/11/22 #90/3 See allergy/contraindication

## 2023-01-12 ENCOUNTER — Ambulatory Visit
Admission: EM | Admit: 2023-01-12 | Discharge: 2023-01-12 | Disposition: A | Discharge status: Home / Self Care | Payer: Medicare HMO | Attending: Urgent Care | Admitting: Urgent Care

## 2023-01-12 ENCOUNTER — Encounter: Payer: Self-pay | Admitting: Emergency Medicine

## 2023-01-12 DIAGNOSIS — U071 COVID-19: Secondary | ICD-10-CM | POA: Diagnosis not present

## 2023-01-12 DIAGNOSIS — J22 Unspecified acute lower respiratory infection: Secondary | ICD-10-CM | POA: Diagnosis not present

## 2023-01-12 DIAGNOSIS — R6889 Other general symptoms and signs: Secondary | ICD-10-CM | POA: Diagnosis not present

## 2023-01-12 DIAGNOSIS — R059 Cough, unspecified: Secondary | ICD-10-CM | POA: Diagnosis not present

## 2023-01-12 MED ORDER — AZITHROMYCIN 250 MG PO TABS: ORAL_TABLET | ORAL | 0 refills | Status: DC

## 2023-01-12 MED ORDER — HYDROCOD POLI-CHLORPHE POLI ER 10-8 MG/5ML PO SUER: 5.0000 mL | Freq: Two times a day (BID) | ORAL | 0 refills | Status: AC | PRN

## 2023-01-12 MED ORDER — BENZONATATE 100 MG PO CAPS: ORAL_CAPSULE | ORAL | 0 refills | Status: DC

## 2023-01-12 NOTE — ED Triage Notes (Signed)
Pt presents with cough, nasal congestion, fatigue, head pressure x 1 week. Her symptoms were worse yesterday.

## 2023-01-12 NOTE — Discharge Instructions (Addendum)
You have been diagnosed with a viral upper respiratory infection based on your symptoms and exam. Viral illnesses cannot be treated with antibiotics - they are self limiting - and you should find your symptoms resolving within a few days. Get plenty of rest and non-caffeinated fluids. Watch for signs of dehydration including reduced urine output and dark colored urine.  We have performed a respiratory swab testing for COVID.   We recommend you use over-the-counter medications for symptom control including acetaminophen (Tylenol), ibuprofen (Advil/Motrin) or naproxen (Aleve) for throat pain, fever, chills or body aches. You may combine use of acetaminophen and ibuprofen/naproxen if needed.  Some patients find an pain-relieving throat spray such as Chloraseptic to be effective.  Also recommend cold/cough medication containing a cough suppressant such as dextromethorphan, as needed. Please note that some cough medications are not recommended if you suffer from hypertension.    Saline mist spray is helpful for removing excess mucus from your nose.  Room humidifiers are helpful to ease breathing at night. I recommend guaifenesin (Mucinex) with plenty of water throughout the day to help thin and loosen mucus secretions in your respiratory passages.   If appropriate based upon your other medical problems, you might also find relief of nasal/sinus congestion symptoms by using a nasal decongestant such as fluticasone (Flonase ) or pseudoephedrine (Sudafed sinus).  You will need to obtain Sudafed from behind the pharmacist counter.  Speak to the pharmacist to verify that you are not duplicating medications with other over-the-counter formulations that you may be using.

## 2023-01-12 NOTE — ED Provider Notes (Signed)
[name]    CSN: [id number] Arrival date & time: 01/12/23  1203      History   Chief Complaint Chief Complaint  Patient presents with   Cough    Cold...headache - Entered by patient   Fatigue   Nasal Congestion    HPI Pamela Anthony is a 67 y.o. female.    Cough   Patient presents with symptoms x 1 week.  She endorses cough, nasal congestion, fatigue, sinus pressure.  States her symptoms were worse yesterday than today.  Reports burning pain in her chest, especially when she coughs.  She states she took at home COVID test and had a positive result though she states it was an "old test".  She requests in-clinic testing.  Past Medical History:  Diagnosis Date   Allergic rhinitis    Allergy    Anxiety    B12 deficiency    Degenerative disc disease    back, knee   Family history of adverse reaction to anesthesia    Fungal infection 2005   lamasil    GERD (gastroesophageal reflux disease)    EGD 10/2004   Hemorrhoids, internal    colonoscopy 02/2008   Hyperlipemia    Iron deficiency anemia    Low back pain    Migraine headache    improved with medications   Obesity    Orthodontics    InvisAlign   Sleep apnea    no cpap   Urinary incontinence    Vaso-vagal reaction    occured after eye drops for cataract surgery, but pt reports this has happened before during medical procedures   Vitamin D deficiency    mild    Patient Active Problem List   Diagnosis Date Noted   Welcome to Medicare preventive visit 11/04/2022   Estrogen deficiency 11/04/2022   Paresthesia of left leg 05/14/2022   Vaso-vagal reaction 03/29/2022   Elevated antinuclear antibody (ANA) level 09/20/2020   Colon cancer screening 03/18/2018   Fatigue 03/18/2018   Prediabetes 03/16/2015   Encounter for routine gynecological examination 09/01/2013   Depression with anxiety 01/18/2013   Post-menopausal 08/14/2012   Other screening mammogram 04/29/2011   Routine general medical  examination at a health care facility 04/22/2011   Joint pain 02/28/2010   Myofascial pain syndrome 02/28/2010   Vitamin D deficiency 03/08/2009   IRRITABLE BOWEL SYNDROME 06/27/2008   B12 deficiency 02/23/2008   POLYP, COLON 03/06/2007   Hyperlipidemia 03/06/2007   GERD 03/06/2007   HIATAL HERNIA 03/06/2007   OSA (obstructive sleep apnea) 03/06/2007   URINARY INCONTINENCE 03/06/2007   Migraine 03/06/2007    Past Surgical History:  Procedure Laterality Date   CATARACT EXTRACTION W/PHACO Left 03/25/2022   Procedure: CATARACT EXTRACTION PHACO AND INTRAOCULAR LENS PLACEMENT ([place]) LEFT;  Surgeon: Leandrew Koyanagi, MD;  Location: [facility];  Service: Ophthalmology;  Laterality: Left;  8.88 1:13.0   CATARACT EXTRACTION W/PHACO Right 04/17/2022   Procedure: CATARACT EXTRACTION PHACO AND INTRAOCULAR LENS PLACEMENT ([facility]) RIGHT 7.22 01:22.1;  Surgeon: Leandrew Koyanagi, MD;  Location: [facility];  Service: Ophthalmology;  Laterality: Right;  sleep apnea   [place] OF UTERUS  2005   HAND SURGERY  2000   nodule removed   KNEE SURGERY  1970's   right   NASAL SINUS SURGERY  2000's   right maxillary   TONSILLECTOMY AND ADENOIDECTOMY      OB History   No obstetric history  on file.      Home Medications    Prior to Admission medications   Medication Sig Start Date End Date Taking? Authorizing Provider  Cholecalciferol (VITAMIN D-3 PO) Take 1 tablet by mouth daily.    [provider]  Cyanocobalamin (B-12 PO) Take by mouth daily.    [provider]  FLUoxetine (PROZAC) 20 MG capsule Take 3 capsules (60 mg total) by mouth daily. Patient taking differently: Take 40 mg by mouth daily. 02/11/22   Tower, Wynelle Fanny, MD  fluticasone (FLONASE) 50 MCG/ACT nasal spray USE 2 SPRAYS IN EACH NOSTRIL EVERY DAY 07/08/22   Tower, Wynelle Fanny, MD  ketoconazole (NIZORAL) 2 % cream Apply 1 application topically daily.  04/28/20   Tower, Wynelle Fanny, MD  loratadine (CLARITIN) 10 MG tablet Take 1 tablet (10 mg total) by mouth daily. 02/11/22   Tower, Wynelle Fanny, MD  mometasone (ELOCON) 0.1 % cream  03/11/18   [provider]  omeprazole (PRILOSEC) 20 MG capsule TAKE 1 CAPSULE EVERY DAY 12/04/22   Tower, Wynelle Fanny, MD  oxybutynin (DITROPAN-XL) 10 MG 24 hr tablet TAKE 1 TABLET AT BEDTIME 12/04/22   Tower, Wynelle Fanny, MD  Probiotic Product (ALIGN PO) Take by mouth daily.    [provider]  rosuvastatin (CRESTOR) 10 MG tablet TAKE 1 TABLET EVERY DAY 12/04/22   Tower, Wynelle Fanny, MD  SUMAtriptan (IMITREX) 20 MG/ACT nasal spray Place 1 spray (20 mg total) into the nose every 2 (two) hours as needed for migraine or headache. May repeat in 2 hours if headache persists or recurs. 10/31/21   Tower, Wynelle Fanny, MD  topiramate (TOPAMAX) 50 MG tablet TAKE 1 TABLET AT BEDTIME 12/04/22   Tower, Wynelle Fanny, MD    Family History Family History  Problem Relation Age of Onset   Coronary artery disease Father        in his 48's   Arthritis Father    Stroke Father    Heart disease Father        pacemaker   Prostate cancer Father    Breast cancer Maternal Grandmother    Colon cancer Other    Breast cancer Cousin        mat cousin   Colon polyps Neg Hx     Social History Social History   Tobacco Use   Smoking status: Never   Smokeless tobacco: Never  Vaping Use   Vaping Use: Never used  Substance Use Topics   Alcohol use: Yes    Alcohol/week: 0.0 standard drinks of alcohol    Comment: occasional   Drug use: No     Allergies   Atorvastatin and Fluogen [influenza virus vaccine]   Review of Systems Review of Systems  Respiratory:  Positive for cough.      Physical Exam Triage Vital Signs ED Triage Vitals  Enc Vitals Group     BP 01/12/23 1315 130/81     Pulse Rate 01/12/23 1315 77     Resp 01/12/23 1315 16     Temp 01/12/23 1315 99.5 F (37.5 C)     Temp Source 01/12/23 1315 Oral     SpO2 01/12/23 1315  98 %     Weight --      Height --      Head Circumference --      Peak Flow --      Pain Score 01/12/23 1314 0     Pain Loc --      Pain Edu? --  Excl. in GC? --    No data found.  Updated Vital Signs BP 130/81 (BP Location: Left Arm)   Pulse 77   Temp 99.5 F (37.5 C) (Oral)   Resp 16   LMP 04/17/2011   SpO2 98%   Visual Acuity Right Eye Distance:   Left Eye Distance:   Bilateral Distance:    Right Eye Near:   Left Eye Near:    Bilateral Near:     Physical Exam Vitals reviewed.  Cardiovascular:     Rate and Rhythm: Normal rate and regular rhythm.     Pulses: Normal pulses.     Heart sounds: Normal heart sounds.  Pulmonary:     Effort: Pulmonary effort is normal.     Breath sounds: Normal breath sounds.  Skin:    General: Skin is warm and dry.  Neurological:     General: No focal deficit present.     Mental Status: She is alert and oriented to person, place, and time.  Psychiatric:        Mood and Affect: Mood normal.        Behavior: Behavior normal.      UC Treatments / Results  Labs (all labs ordered are listed, but only abnormal results are displayed) Labs Reviewed - No data to display  EKG   Radiology No results found.  Procedures Procedures (including critical care time)  Medications Ordered in UC Medications - No data to display  Initial Impression / Assessment and Plan / UC Course  I have reviewed the triage vital signs and the nursing notes.  Pertinent labs & imaging results that were available during my care of the patient were reviewed by me and considered in my medical decision making (see chart for details).   Patient is afebrile here without recent antipyretics. Satting well on room air. Overall is ill appearing, well hydrated, without respiratory distress. Pulmonary exam is unremarkable.  Lungs CTAB without wheezing, rhonchi, rales.  Symptoms are consistent with an acute viral process.  Likely COVID given her positive  at-home test though will verify at her request in clinic.  Given symptoms now greater than 1 week, concern for risk of developing secondary infection and will prescribe azithromycin.  Also will prescribe treatment for cough suppression.  Final Clinical Impressions(s) / UC Diagnoses   Final diagnoses:  None   Discharge Instructions   None    ED Prescriptions   None    PDMP not reviewed this encounter.   Rose Phi, [place] 01/12/23 1346

## 2023-01-13 LAB — SARS CORONAVIRUS 2 (TAT 6-24 HRS): SARS Coronavirus 2: POSITIVE — AB

## 2023-03-06 ENCOUNTER — Other Ambulatory Visit: Payer: Self-pay | Admitting: *Deleted

## 2023-03-06 MED ORDER — LORATADINE 10 MG PO TABS: 10.0000 mg | ORAL_TABLET | Freq: Every day | ORAL | 1 refills | Status: DC

## 2023-03-13 DIAGNOSIS — H43811 Vitreous degeneration, right eye: Secondary | ICD-10-CM | POA: Diagnosis not present

## 2023-03-13 DIAGNOSIS — H40003 Preglaucoma, unspecified, bilateral: Secondary | ICD-10-CM | POA: Diagnosis not present

## 2023-03-24 ENCOUNTER — Other Ambulatory Visit: Payer: Self-pay | Admitting: Family Medicine

## 2023-04-02 ENCOUNTER — Other Ambulatory Visit: Payer: Self-pay | Admitting: Family Medicine

## 2023-04-02 ENCOUNTER — Encounter: Payer: Self-pay | Admitting: Family Medicine

## 2023-04-03 NOTE — Telephone Encounter (Signed)
Last filled on 02/11/22 #270 caps with 3 refills, CPE was on 11/04/22

## 2023-04-15 DIAGNOSIS — J302 Other seasonal allergic rhinitis: Secondary | ICD-10-CM | POA: Diagnosis not present

## 2023-04-15 DIAGNOSIS — L2084 Intrinsic (allergic) eczema: Secondary | ICD-10-CM | POA: Diagnosis not present

## 2023-04-16 DIAGNOSIS — J301 Allergic rhinitis due to pollen: Secondary | ICD-10-CM | POA: Diagnosis not present

## 2023-04-17 DIAGNOSIS — M25522 Pain in left elbow: Secondary | ICD-10-CM | POA: Insufficient documentation

## 2023-04-17 DIAGNOSIS — M899 Disorder of bone, unspecified: Secondary | ICD-10-CM | POA: Insufficient documentation

## 2023-04-26 DIAGNOSIS — M899 Disorder of bone, unspecified: Secondary | ICD-10-CM | POA: Diagnosis not present

## 2023-04-26 DIAGNOSIS — M67824 Other specified disorders of tendon, left elbow: Secondary | ICD-10-CM | POA: Diagnosis not present

## 2023-04-26 DIAGNOSIS — S46212A Strain of muscle, fascia and tendon of other parts of biceps, left arm, initial encounter: Secondary | ICD-10-CM | POA: Diagnosis not present

## 2023-04-26 DIAGNOSIS — M25522 Pain in left elbow: Secondary | ICD-10-CM | POA: Diagnosis not present

## 2023-05-07 DIAGNOSIS — H40003 Preglaucoma, unspecified, bilateral: Secondary | ICD-10-CM | POA: Diagnosis not present

## 2023-05-12 DIAGNOSIS — S46212D Strain of muscle, fascia and tendon of other parts of biceps, left arm, subsequent encounter: Secondary | ICD-10-CM | POA: Diagnosis not present

## 2023-05-15 DIAGNOSIS — H04123 Dry eye syndrome of bilateral lacrimal glands: Secondary | ICD-10-CM | POA: Diagnosis not present

## 2023-05-15 DIAGNOSIS — H43811 Vitreous degeneration, right eye: Secondary | ICD-10-CM | POA: Diagnosis not present

## 2023-05-15 DIAGNOSIS — Z961 Presence of intraocular lens: Secondary | ICD-10-CM | POA: Diagnosis not present

## 2023-05-15 DIAGNOSIS — H40003 Preglaucoma, unspecified, bilateral: Secondary | ICD-10-CM | POA: Diagnosis not present

## 2023-05-19 ENCOUNTER — Ambulatory Visit: Payer: Medicare HMO | Attending: Orthopedic Surgery | Admitting: Occupational Therapy

## 2023-05-19 DIAGNOSIS — M6281 Muscle weakness (generalized): Secondary | ICD-10-CM | POA: Diagnosis not present

## 2023-05-19 DIAGNOSIS — M25522 Pain in left elbow: Secondary | ICD-10-CM | POA: Insufficient documentation

## 2023-05-19 DIAGNOSIS — S46212A Strain of muscle, fascia and tendon of other parts of biceps, left arm, initial encounter: Secondary | ICD-10-CM | POA: Insufficient documentation

## 2023-05-19 NOTE — Therapy (Signed)
[facility] [address] [place], [place], [address] Phone: [phone]   Fax:  [phone]  Occupational Therapy Evaluation  Patient Details  Name: Pamela Anthony MRN: [id number] Date of Birth: 1956-09-28 Referring Provider (OT): Dr Malissa Hippo   Encounter Date: 05/19/2023   OT End of Session - 05/19/23 1743     Visit Number 1    Number of Visits 6    Date for OT Re-Evaluation 06/30/23    OT Start Time 1603    OT Stop Time 1645    OT Time Calculation (min) 42 min    Activity Tolerance Patient tolerated treatment well    Behavior During Therapy [facility] for tasks assessed/performed             Past Medical History:  Diagnosis Date   Allergic rhinitis    Allergy    Anxiety    B12 deficiency    Degenerative disc disease    back, knee   Family history of adverse reaction to anesthesia    Fungal infection 2005   lamasil    GERD (gastroesophageal reflux disease)    EGD 10/2004   Hemorrhoids, internal    colonoscopy 02/2008   Hyperlipemia    Iron deficiency anemia    Low back pain    Migraine headache    improved with medications   Obesity    Orthodontics    InvisAlign   Sleep apnea    no cpap   Urinary incontinence    Vaso-vagal reaction    occured after eye drops for cataract surgery, but pt reports this has happened before during medical procedures   Vitamin D deficiency    mild    Past Surgical History:  Procedure Laterality Date   CATARACT EXTRACTION W/PHACO Left 03/25/2022   Procedure: CATARACT EXTRACTION PHACO AND INTRAOCULAR LENS PLACEMENT ([facility]) LEFT;  Surgeon: Lockie Mola, MD;  [facility];  Service: Ophthalmology;  Laterality: Left;  8.88 1:13.0   CATARACT EXTRACTION W/PHACO Right 04/17/2022   Procedure: CATARACT EXTRACTION PHACO AND INTRAOCULAR LENS PLACEMENT ([facility]) RIGHT 7.22 01:22.1;  Surgeon: Lockie Mola, MD;  Location: [facility];  Service:  Ophthalmology;  Laterality: Right;  sleep apnea   CESAREAN SECTION  1985   COLONOSCOPY     DILATION AND CURETTAGE OF UTERUS  2005   HAND SURGERY  2000   nodule removed   KNEE SURGERY  1970's   right   NASAL SINUS SURGERY  2000's   right maxillary   TONSILLECTOMY AND ADENOIDECTOMY      There were no vitals filed for this visit.   Subjective Assessment - 05/19/23 1700     Subjective  My elbow hurts constantly.  Before I felt it here and there but since April  started bothering him constantly.  That is why I seen D in May -  By the end of the day 7-8/10 in the elbow into my forearm/ wrist and hand.  Mostly of the day computer and phone.  At night is hard to get comfortable too.    Pertinent History 05/12/23 Ortho visit: I have personally reviewed the following studies: 04/17/2023: Plain film radiographs of the left elbow demonstrate ectopic bone growth off the radial tuberosity as well as the ulna. MRI was reviewed and demonstrates partial tearing of the distal biceps tendon with edema and chronic degenerative changes with ectopic bone at the biceps insertion.  Additional Studies: n/a  Assessment/Plan: 1. Left elbow pain  2. Biceps  rupture, distal, left, subsequent encounter Ambulatory referral to OT   We discussed treatment options for her partial distal biceps tear. Given her age and her desire to avoid surgery we will plan to trial nonoperative treatment. We will have her work occupational therapy near her house in [place]. Will have her follow-up in 8 weeks. If her symptoms worsen despite physical therapy we would possibly consider surgical treatment which would likely consist of biceps tendon repair versus reconstruction with allograft and possible ectopic bone resection. She is in agreement with the pla    Patient Stated Goals I need to figure out if need surgery and can cont like this - would like the pain better    Currently in Pain? 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  To decrease pain and increase motion for elbow and forearm.  With less pain. Patient fitted with a Tubigrip F to use at nighttime on the elbow to decrease discomfort and pain. Tubigrip D for daytime. Patient did had less pain using Tubigrip while doing elbow flexion as well as supination pronation focusing with elbow to side for pronation.  10-12 reps keeping pain under 3 out of 10 if possible As well as wrist flexion extension. Reviewed with patient positions that should be less pain when carrying light objects       OT Education - 05/19/23 1742     Education Details Findings of evaluation and home program    Person(s) Educated Patient    Methods Explanation;Demonstration;Tactile cues;Verbal cues;Handout    Comprehension Verbal cues required;Returned  demonstration;Verbalized understanding                 OT Long Term Goals - 05/19/23 1748       OT LONG TERM  GOAL #1   Title Patient to be independent in home program to decrease pain and maintain active range of motion within normal limits and left elbow, wrist and hand    Baseline Pain coming in and can increase by the end of the day and with use 7-8/10 at the elbow.  Decreased pronation as well is elbow extension.    Time 6    Period Weeks    Status New    Target Date 06/30/23                   Plan - 05/19/23 1744     Clinical Impression Statement Patient referred to OT with a diagnosis of left partial distal bicep tendon rupture.  Degenerative changes.  Patient sent for use of modalities to decrease pain and AROM to increase and maintain ROM.  No strengthening.  Patient limited at endrange pronation as well as elbow extension.  Patient with discomfort at endrange elbow flexion and supination in neutral position.  As well as comfort pain with some popping with supination/pronation.  Pain coming and 7-8/10 at elbow.  Patient reports at the end of the day the worse as well as when using it with upper body dressing as well as putting pains trying to carry coffee cup.  Patient was educated to use contrast to decrease pain and increase motion with less pain and fitted with a Tubigrip F for comfort.  It patient to come to the home program for week and follow-up.  Patient limited to show use of left UE in ADLs and I ADLs.  Patient can benefit from skilled OT services to decrease this, and pain in his maintain AROM elbow, wrist and hand.    OT Occupational Profile and History Problem Focused Assessment - Including review of records relating to presenting problem    Occupational performance deficits (Please refer to evaluation for details): ADL's;IADL's;Rest and Sleep;Work;Play;Leisure;Social Participation    Body Structure / Function / Physical Skills ADL;Decreased knowledge of  precautions;ROM;UE functional use;Pain;Strength;IADL    Rehab Potential Poor    Clinical Decision Making Limited treatment options, no task modification necessary    Comorbidities Affecting Occupational Performance: None    Modification or Assistance to Complete Evaluation  No modification of tasks or assist necessary to complete eval    OT Frequency 1x / week    OT Duration 6 weeks    OT Treatment/Interventions Self-care/ADL training;Contrast Bath;Iontophoresis;Manual Therapy;Patient/family education;Splinting;Passive range of motion    Consulted and Agree with Plan of Care Patient             Patient will benefit from skilled therapeutic intervention in order to improve the following deficits and impairments:   Body Structure / Function / Physical Skills: ADL, Decreased knowledge of precautions, ROM, UE functional use, Pain, Strength, IADL       Visit Diagnosis: Biceps rupture, distal, left, initial encounter  Pain in left elbow  Muscle weakness (generalized)    Problem List Patient Active Problem List   Diagnosis Date Noted   Welcome to Medicare preventive visit 11/04/2022   Estrogen deficiency 11/04/2022   Paresthesia of left leg 05/14/2022   Vaso-vagal reaction 03/29/2022   Elevated antinuclear antibody (ANA) level 09/20/2020   Colon cancer screening 03/18/2018   Fatigue 03/18/2018   Prediabetes 03/16/2015   Encounter for routine gynecological examination 09/01/2013   Depression with anxiety 01/18/2013   Post-menopausal 08/14/2012   Other screening mammogram 04/29/2011   Routine general medical examination at a health care facility  04/22/2011   Joint pain 02/28/2010   Myofascial pain syndrome 02/28/2010   Vitamin D deficiency 03/08/2009   IRRITABLE BOWEL SYNDROME 06/27/2008   B12 deficiency 02/23/2008   POLYP, COLON 03/06/2007   Hyperlipidemia 03/06/2007   GERD 03/06/2007   HIATAL HERNIA 03/06/2007   OSA (obstructive sleep apnea) 03/06/2007   URINARY  INCONTINENCE 03/06/2007   Migraine 03/06/2007    Oletta Cohn, OTR/L,CLT 05/19/2023, 5:49 PM  [facility] [address], [place], [address] Phone: [phone]   Fax:  [phone]  Name: Pamela Anthony MRN: [id number] Date of Birth: [birth year]

## 2023-05-26 ENCOUNTER — Ambulatory Visit: Payer: Medicare HMO | Admitting: Occupational Therapy

## 2023-05-26 DIAGNOSIS — S46212A Strain of muscle, fascia and tendon of other parts of biceps, left arm, initial encounter: Secondary | ICD-10-CM | POA: Diagnosis not present

## 2023-05-26 DIAGNOSIS — M25522 Pain in left elbow: Secondary | ICD-10-CM

## 2023-05-26 DIAGNOSIS — M6281 Muscle weakness (generalized): Secondary | ICD-10-CM

## 2023-05-26 NOTE — Therapy (Signed)
[facility] [address] [place], [place], [address] Phone: [phone]   Fax:  [phone]  Occupational Therapy Treatment  Patient Details  Name: Pamela Anthony MRN: [id number] Date of Birth: [birth year] Referring Provider (OT): Dr Malissa Hippo   Encounter Date: 05/26/2023   OT End of Session - 05/26/23 1408     Visit Number 2    Number of Visits 6    Date for OT Re-Evaluation 06/30/23    OT Start Time 1347    OT Stop Time 1402    OT Time Calculation (min) 15 min    Activity Tolerance Patient tolerated treatment well    Behavior During Therapy [facility] for tasks assessed/performed             Past Medical History:  Diagnosis Date   Allergic rhinitis    Allergy    Anxiety    B12 deficiency    Degenerative disc disease    back, knee   Family history of adverse reaction to anesthesia    Fungal infection 2005   lamasil    GERD (gastroesophageal reflux disease)    EGD 10/2004   Hemorrhoids, internal    colonoscopy 02/2008   Hyperlipemia    Iron deficiency anemia    Low back pain    Migraine headache    improved with medications   Obesity    Orthodontics    InvisAlign   Sleep apnea    no cpap   Urinary incontinence    Vaso-vagal reaction    occured after eye drops for cataract surgery, but pt reports this has happened before during medical procedures   Vitamin D deficiency    mild    Past Surgical History:  Procedure Laterality Date   CATARACT EXTRACTION W/PHACO Left 03/25/2022   Procedure: CATARACT EXTRACTION PHACO AND INTRAOCULAR LENS PLACEMENT (IOC) LEFT;  Surgeon: Lockie Mola, MD;  Location: [facility];  Service: Ophthalmology;  Laterality: Left;  8.88 1:13.0   CATARACT EXTRACTION W/PHACO Right 04/17/2022   Procedure: CATARACT EXTRACTION PHACO AND INTRAOCULAR LENS PLACEMENT (IOC) RIGHT 7.22 01:22.1;  Surgeon: Lockie Mola, MD;  Location: [facility];  Service:  Ophthalmology;  Laterality: Right;  sleep apnea   CESAREAN SECTION  1985   COLONOSCOPY     DILATION AND CURETTAGE OF UTERUS  2005   HAND SURGERY  2000   nodule removed   KNEE SURGERY  1970's   right   NASAL SINUS SURGERY  2000's   right maxillary   TONSILLECTOMY AND ADENOIDECTOMY      There were no vitals filed for this visit.   Subjective Assessment - 05/26/23 1406     Subjective  MY elbow the same - I dropped off my medical records at Dr Carlos Levering office last Friday - so I hope he can help me- ache by the end of the day - ice/heat helps but then it cames back- and wearing the light compression    Pertinent History 05/12/23 Ortho visit: I have personally reviewed the following studies: 04/17/2023: Plain film radiographs of the left elbow demonstrate ectopic bone growth off the radial tuberosity as well as the ulna. MRI was reviewed and demonstrates partial tearing of the distal biceps tendon with edema and chronic degenerative changes with ectopic bone at the biceps insertion.  Additional Studies: n/a  Assessment/Plan: 1. Left elbow pain  2. Biceps rupture, distal, left, subsequent encounter Ambulatory referral to OT   We discussed treatment options for her partial distal  biceps tear. Given her age and her desire to avoid surgery we will plan to trial nonoperative treatment. We will have her work occupational therapy near her house in [place]. Will have her follow-up in 8 weeks. If her symptoms worsen despite physical therapy we would possibly consider surgical treatment which would likely consist of biceps tendon repair versus reconstruction with allograft and possible ectopic bone resection. She is in agreement with the pla    Patient Stated Goals I need to figure out if need surgery and can cont like this - would like the pain better    Currently in Pain? Yes    Pain Score 6     Pain Location Elbow    Pain Orientation Left    Pain Descriptors / Indicators Aching    Pain Type Acute pain     Pain Onset More than a month ago    Pain Frequency Constant                 Pt return after doing HEP for contrast , light compression tubigrip F on arm and AROM pain free for L UE shoulder, elbow and forearm/wrist  Pain same   Patient can cont to do contrast in the morning and evening.  Pt report pain decrease for about hour or 2 Cont to wear Tubigrip F to use at nighttime and some during day if feels better with it on   Patient did had less pain using Tubigrip while doing elbow flexion as well as supination pronation focusing with elbow to side for pronation.  10-12 reps keeping pain under 3 out of 10 if possible As well as wrist flexion extension. Reviewed with pt to no prop up on L elbow or cradling- to decrease tenderness over Cubital tunnel  Pt await DR Amanda Pea looking at her medical records -  Pt to call for follow up in future                OT Education - 05/26/23 1407     Education Details HEP to maintain her ROM -    Person(s) Educated Patient    Methods Explanation;Demonstration;Tactile cues;Verbal cues;Handout    Comprehension Verbal cues required;Returned demonstration;Verbalized understanding                 OT Long Term Goals - 05/26/23 1412       OT LONG TERM GOAL #1   Title Patient to be independent in home program to decrease pain and maintain active range of motion within normal limits and left elbow, wrist and hand    Baseline Pain coming in and can increase by the end of the day and with use 7-8/10 at the elbow.  Decreased pronation as well is elbow extension. NOW AROM WNL except pronation - pain cont to be 7-8/10 at the end of day- await visit wtih Dr Amanda Pea possible surgery    Status Deferred                   Plan - 05/26/23 1409     Clinical Impression Statement Patient referred to OT with a diagnosis of left partial distal bicep tendon rupture.  Degenerative changes.  Patient sent for use of modalities to decrease  pain and AROM to increase and maintain ROM.  No strengthening.  Patient limited at endrange pronation as well as elbow extension.  Patient with discomfort at endrange elbow flexion and supination in neutral position.  As well as reports of some popping with supination/pronation.  Pain cont to be constant and by the end of the day 7-8/10 at elbow down to hand. Patient  can cont with contrast to decrease pain and increase motion with less pain and  can cont with Tubigrip F for light comrpession.  Pt  waiting for second opinion and possible surgery by DR Gramig. Pt tender over cubital tunnel - pt ed on not cradling and propping up on elbow . Pt to contact me in future if need OT.    OT Occupational Profile and History Problem Focused Assessment - Including review of records relating to presenting problem    Occupational performance deficits (Please refer to evaluation for details): ADL's;IADL's;Rest and Sleep;Work;Play;Leisure;Social Participation    Body Structure / Function / Physical Skills ADL;Decreased knowledge of precautions;ROM;UE functional use;Pain;Strength;IADL    Rehab Potential Poor    Clinical Decision Making Limited treatment options, no task modification necessary    Comorbidities Affecting Occupational Performance: None    Modification or Assistance to Complete Evaluation  No modification of tasks or assist necessary to complete eval    OT Frequency 1x / week    OT Duration 6 weeks    OT Treatment/Interventions Self-care/ADL training;Contrast Bath;Iontophoresis;Manual Therapy;Patient/family education;Splinting;Passive range of motion    Consulted and Agree with Plan of Care Patient             Patient will benefit from skilled therapeutic intervention in order to improve the following deficits and impairments:   Body Structure / Function / Physical Skills: ADL, Decreased knowledge of precautions, ROM, UE functional use, Pain, Strength, IADL       Visit Diagnosis: Biceps  rupture, distal, left, initial encounter  Muscle weakness (generalized)  Pain in left elbow    Problem List Patient Active Problem List   Diagnosis Date Noted   Welcome to Medicare preventive visit 11/04/2022   Estrogen deficiency 11/04/2022   Paresthesia of left leg 05/14/2022   Vaso-vagal reaction 03/29/2022   Elevated antinuclear antibody (ANA) level 09/20/2020   Colon cancer screening 03/18/2018   Fatigue 03/18/2018   Prediabetes 03/16/2015   Encounter for routine gynecological examination 09/01/2013   Depression with anxiety 01/18/2013   Post-menopausal 08/14/2012   Other screening mammogram 04/29/2011   Routine general medical examination at a health care facility 04/22/2011   Joint pain 02/28/2010   Myofascial pain syndrome 02/28/2010   Vitamin D deficiency 03/08/2009   IRRITABLE BOWEL SYNDROME 06/27/2008   B12 deficiency 02/23/2008   POLYP, COLON 03/06/2007   Hyperlipidemia 03/06/2007   GERD 03/06/2007   HIATAL HERNIA 03/06/2007   OSA (obstructive sleep apnea) 03/06/2007   URINARY INCONTINENCE 03/06/2007   Migraine 03/06/2007    Oletta Cohn, OTR/L,CLT 05/26/2023, 2:14 PM  [facility] [address] [place], [address] Phone: [phone]   Fax:  [phone]  Name: Pamela Anthony MRN: [id number] Date of Birth: 1956/04/15

## 2023-05-29 ENCOUNTER — Encounter: Payer: Self-pay | Admitting: Family Medicine

## 2023-05-30 ENCOUNTER — Other Ambulatory Visit: Payer: Self-pay | Admitting: Family Medicine

## 2023-05-30 MED ORDER — TRAMADOL HCL 50 MG PO TABS: 50.0000 mg | ORAL_TABLET | Freq: Two times a day (BID) | ORAL | 0 refills | Status: AC | PRN

## 2023-06-10 DIAGNOSIS — L2084 Intrinsic (allergic) eczema: Secondary | ICD-10-CM | POA: Diagnosis not present

## 2023-06-10 DIAGNOSIS — J302 Other seasonal allergic rhinitis: Secondary | ICD-10-CM | POA: Diagnosis not present

## 2023-07-08 ENCOUNTER — Encounter: Payer: Self-pay | Admitting: Family Medicine

## 2023-07-08 ENCOUNTER — Ambulatory Visit (INDEPENDENT_AMBULATORY_CARE_PROVIDER_SITE_OTHER): Payer: Medicare HMO | Admitting: Family Medicine

## 2023-07-08 VITALS — BP 112/76 | HR 71 | Temp 97.6°F | Ht 66.0 in | Wt 187.0 lb

## 2023-07-08 DIAGNOSIS — G43709 Chronic migraine without aura, not intractable, without status migrainosus: Secondary | ICD-10-CM

## 2023-07-08 DIAGNOSIS — R519 Headache, unspecified: Secondary | ICD-10-CM | POA: Insufficient documentation

## 2023-07-08 DIAGNOSIS — S46212S Strain of muscle, fascia and tendon of other parts of biceps, left arm, sequela: Secondary | ICD-10-CM | POA: Diagnosis not present

## 2023-07-08 MED ORDER — PREDNISONE 20 MG PO TABS: ORAL_TABLET | ORAL | 0 refills | Status: DC

## 2023-07-08 NOTE — Assessment & Plan Note (Signed)
Unsure if left sided acute headache is migraine  No missed topamax  Imitrex did not help  No vision change Reassuring vitals and exam today   Low threshold to image if not improved  Prednisone taper prescription

## 2023-07-08 NOTE — Assessment & Plan Note (Signed)
Ongoing  Took a few tramadol and ibuprofen in last month  Using compression wrap  Is uncomfortable  Has hand surgeon appt tomorrow Hopes she will not need surgery

## 2023-07-08 NOTE — Assessment & Plan Note (Signed)
Left side- 2 weeks Has chronic migraine/ this seems different  Reassuring exam/ vitals and no neuro changes  No temple tenderness/no vision change (hesitant to run esr or crp due to current arm injury, suspect would be elevated)  Will watch for signs and symptoms of bacterial sinusitis   Prednisone taper today prescription to stop cycle and help with inflammation  Update if not starting to improve in a week or if worsening  Call back and Er precautions noted in detail today

## 2023-07-08 NOTE — Patient Instructions (Signed)
Use a cold compress for head pain - 10 minutes at a time Stay hydrated Don't over use caffeine   Take prednisone as directed to help break headache cycle Watch for fever or any other symptoms   Update if not starting to improve in a week or if worsening    If severe -go to the ER   Follow up with hand doctor tomorrow

## 2023-07-08 NOTE — Progress Notes (Signed)
Subjective:    Patient ID: Pamela Anthony, female    DOB: 09/03/1956, 67 y.o.   MRN: [id number]  HPI  Wt Readings from Last 3 Encounters:  07/08/23 187 lb (84.8 kg)  11/04/22 174 lb (78.9 kg)  09/11/22 173 lb 2 oz (78.5 kg)   30.18 kg/m  Vitals:   07/08/23 1408  BP: 112/76  Pulse: 71  Temp: 97.6 F (36.4 C)  SpO2: 98%    Pt presents for headache on left side for several weeks (in setting of migraine history) Also left arm pain with bicep tendon tear    Worse if she coughs or bends over  May be sinus issue?  Not that congested  Takes zyrtec daily   Constant discomfort  Throbs only of cough/ strain  No temple tenderness   No n/v  No dizziness  No sensitivity to light or sound   No heat illness No dehydration   Caffeine- ice tea in am and coffee in afternoon   No increase in stress  No change in sleep or mood   Has new pain with left bicep tear- has follow up with hand surgeon tomorrow    She bumped head 2 mo ago=not severe     Has history of migraine-takes topamax 50 mg daily for prophylaxis  Also myofascial pain issues in past   Muscle relaxer no help  Sumatriptan does not helsp  Tried ibuprofen    Patient Active Problem List   Diagnosis Date Noted   Left-sided headache 07/08/2023   Biceps muscle tear, left, sequela 07/08/2023   Welcome to Medicare preventive visit 11/04/2022   Estrogen deficiency 11/04/2022   Paresthesia of left leg 05/14/2022   Vaso-vagal reaction 03/29/2022   Elevated antinuclear antibody (ANA) level 09/20/2020   Colon cancer screening 03/18/2018   Fatigue 03/18/2018   Prediabetes 03/16/2015   Encounter for routine gynecological examination 09/01/2013   Depression with anxiety 01/18/2013   Post-menopausal 08/14/2012   Other screening mammogram 04/29/2011   Routine general medical examination at a health care facility 04/22/2011   Joint pain 02/28/2010   Myofascial pain syndrome 02/28/2010   Vitamin D deficiency  03/08/2009   IRRITABLE BOWEL SYNDROME 06/27/2008   B12 deficiency 02/23/2008   POLYP, COLON 03/06/2007   Hyperlipidemia 03/06/2007   GERD 03/06/2007   HIATAL HERNIA 03/06/2007   OSA (obstructive sleep apnea) 03/06/2007   URINARY INCONTINENCE 03/06/2007   Migraine 03/06/2007   Past Medical History:  Diagnosis Date   Allergic rhinitis    Allergy    Anxiety    B12 deficiency    Degenerative disc disease    back, knee   Family history of adverse reaction to anesthesia    Fungal infection 2005   lamasil    GERD (gastroesophageal reflux disease)    EGD 10/2004   Hemorrhoids, internal    colonoscopy 02/2008   Hyperlipemia    Iron deficiency anemia    Low back pain    Migraine headache    improved with medications   Obesity    Orthodontics    InvisAlign   Sleep apnea    no cpap   Urinary incontinence    Vaso-vagal reaction    occured after eye drops for cataract surgery, but pt reports this has happened before during medical procedures   Vitamin D deficiency    mild   Past Surgical History:  Procedure Laterality Date   CATARACT EXTRACTION W/PHACO Left 03/25/2022   Procedure: CATARACT EXTRACTION PHACO AND INTRAOCULAR LENS PLACEMENT (  IOC) LEFT;  Surgeon: Lockie Mola, MD;  Location: [facility];  Service: Ophthalmology;  Laterality: Left;  8.88 1:13.0   CATARACT EXTRACTION W/PHACO Right 04/17/2022   Procedure: CATARACT EXTRACTION PHACO AND INTRAOCULAR LENS PLACEMENT (IOC) RIGHT 7.22 01:22.1;  Surgeon: Lockie Mola, MD;  Location: [facility];  Service: Ophthalmology;  Laterality: Right;  sleep apnea   CESAREAN SECTION  1985   COLONOSCOPY     DILATION AND CURETTAGE OF UTERUS  2005   HAND SURGERY  2000   nodule removed   KNEE SURGERY  1970's   right   NASAL SINUS SURGERY  2000's   right maxillary   TONSILLECTOMY AND ADENOIDECTOMY     Social History   Tobacco Use   Smoking status: Never   Smokeless tobacco: Never  Vaping Use   Vaping  status: Never Used  Substance Use Topics   Alcohol use: Yes    Alcohol/week: 0.0 standard drinks of alcohol    Comment: occasional   Drug use: No   Family History  Problem Relation Age of Onset   Coronary artery disease Father        in his 66's   Arthritis Father    Stroke Father    Heart disease Father        pacemaker   Prostate cancer Father    Breast cancer Maternal Grandmother    Colon cancer Other    Breast cancer Cousin        mat cousin   Colon polyps Neg Hx    Allergies  Allergen Reactions   Atorvastatin     REACTION: myalgias   Fluogen [Influenza Virus Vaccine]     High dose  Felt sick/achy    Current Outpatient Medications on File Prior to Visit  Medication Sig Dispense Refill   Cholecalciferol (VITAMIN D-3 PO) Take 1 tablet by mouth daily.     Cyanocobalamin (B-12 PO) Take by mouth daily.     FLUoxetine (PROZAC) 20 MG capsule TAKE 3 CAPSULES EVERY DAY 270 capsule 2   fluticasone (FLONASE) 50 MCG/ACT nasal spray USE 2 SPRAYS IN EACH NOSTRIL EVERY DAY 48 g 3   mometasone (ELOCON) 0.1 % cream   0   omeprazole (PRILOSEC) 20 MG capsule TAKE 1 CAPSULE EVERY DAY 90 capsule 3   oxybutynin (DITROPAN-XL) 10 MG 24 hr tablet TAKE 1 TABLET AT BEDTIME 90 tablet 3   Probiotic Product (ALIGN PO) Take by mouth daily.     rosuvastatin (CRESTOR) 10 MG tablet TAKE 1 TABLET EVERY DAY 90 tablet 3   topiramate (TOPAMAX) 50 MG tablet TAKE 1 TABLET AT BEDTIME 90 tablet 3   No current facility-administered medications on file prior to visit.    Review of Systems  Constitutional:  Negative for activity change, appetite change, fatigue, fever and unexpected weight change.  HENT:  Negative for congestion, ear pain, rhinorrhea, sinus pressure and sore throat.   Eyes:  Negative for pain, redness and visual disturbance.  Respiratory:  Negative for cough, shortness of breath and wheezing.   Cardiovascular:  Negative for chest pain and palpitations.  Gastrointestinal:  Negative for  abdominal pain, blood in stool, constipation and diarrhea.  Endocrine: Negative for polydipsia and polyuria.  Genitourinary:  Negative for dysuria, frequency and urgency.  Musculoskeletal:  Negative for arthralgias, back pain and myalgias.       Left arm pain from bicep tear    Skin:  Negative for pallor and rash.  Allergic/Immunologic: Negative for environmental allergies.  Neurological:  Positive for headaches. Negative for dizziness, seizures, syncope, facial asymmetry, speech difficulty, light-headedness and numbness.  Hematological:  Negative for adenopathy. Does not bruise/bleed easily.  Psychiatric/Behavioral:  Negative for decreased concentration and dysphoric mood. The patient is not nervous/anxious.        Objective:   Physical Exam Constitutional:      General: She is not in acute distress.    Appearance: She is well-developed. She is obese. She is not ill-appearing or diaphoretic.  HENT:     Head: Normocephalic and atraumatic.     Comments: No sinus or temporal tenderness    Right Ear: Tympanic membrane, ear canal and external ear normal.     Left Ear: Tympanic membrane, ear canal and external ear normal.     Nose: Nose normal.     Mouth/Throat:     Mouth: Mucous membranes are moist.     Pharynx: No oropharyngeal exudate.  Eyes:     General: No scleral icterus.       Right eye: No discharge.        Left eye: No discharge.     Conjunctiva/sclera: Conjunctivae normal.     Pupils: Pupils are equal, round, and reactive to light.     Comments: No nystagmus  Neck:     Thyroid: No thyromegaly.     Vascular: No carotid bruit or JVD.     Trachea: No tracheal deviation.  Cardiovascular:     Rate and Rhythm: Normal rate and regular rhythm.     Heart sounds: Normal heart sounds. No murmur heard. Pulmonary:     Effort: Pulmonary effort is normal. No respiratory distress.     Breath sounds: Normal breath sounds. No wheezing or rales.  Abdominal:     General: There is no  distension.  Musculoskeletal:        General: No tenderness.     Cervical back: Full passive range of motion without pain, normal range of motion and neck supple.     Right lower leg: No edema.     Left lower leg: Edema present.     Comments: Limited rom of left forearm /elbow / wearing compression garment  Is tender and forearm is mildly swollen  Lymphadenopathy:     Cervical: No cervical adenopathy.  Skin:    General: Skin is warm and dry.     Coloration: Skin is not pale.     Findings: No rash.  Neurological:     Mental Status: She is alert and oriented to person, place, and time.     Cranial Nerves: No cranial nerve deficit, dysarthria or facial asymmetry.     Sensory: Sensation is intact. No sensory deficit.     Motor: Motor function is intact. No weakness, tremor, atrophy, abnormal muscle tone, seizure activity or pronator drift.     Coordination: Romberg sign negative. Coordination normal. Finger-Nose-Finger Test normal.     Gait: Gait and tandem walk normal.     Deep Tendon Reflexes: Reflexes are normal and symmetric.     Comments: No focal cerebellar signs   Psychiatric:        Behavior: Behavior normal.        Thought Content: Thought content normal.           Assessment & Plan:   Problem List Items Addressed This Visit       Cardiovascular and Mediastinum   Migraine    Unsure if left sided acute headache is migraine  No missed topamax  Imitrex did  not help  No vision change Reassuring vitals and exam today   Low threshold to image if not improved  Prednisone taper prescription         Musculoskeletal and Integument   Biceps muscle tear, left, sequela    Ongoing  Took a few tramadol and ibuprofen in last month  Using compression wrap  Is uncomfortable  Has hand surgeon appt tomorrow Hopes she will not need surgery         Other   Left-sided headache - Primary    Left side- 2 weeks Has chronic migraine/ this seems different  Reassuring exam/  vitals and no neuro changes  No temple tenderness/no vision change (hesitant to run esr or crp due to current arm injury, suspect would be elevated)  Will watch for signs and symptoms of bacterial sinusitis   Prednisone taper today prescription to stop cycle and help with inflammation  Update if not starting to improve in a week or if worsening  Call back and Er precautions noted in detail today

## 2023-07-09 DIAGNOSIS — M25522 Pain in left elbow: Secondary | ICD-10-CM | POA: Diagnosis not present

## 2023-07-09 DIAGNOSIS — S46291A Other injury of muscle, fascia and tendon of other parts of biceps, right arm, initial encounter: Secondary | ICD-10-CM | POA: Diagnosis not present

## 2023-07-10 DIAGNOSIS — S46219A Strain of muscle, fascia and tendon of other parts of biceps, unspecified arm, initial encounter: Secondary | ICD-10-CM | POA: Insufficient documentation

## 2023-07-15 ENCOUNTER — Encounter: Payer: Self-pay | Admitting: Family Medicine

## 2023-07-15 MED ORDER — AMOXICILLIN-POT CLAVULANATE 875-125 MG PO TABS: 1.0000 | ORAL_TABLET | Freq: Two times a day (BID) | ORAL | 0 refills | Status: DC

## 2023-07-29 DIAGNOSIS — H903 Sensorineural hearing loss, bilateral: Secondary | ICD-10-CM | POA: Diagnosis not present

## 2023-07-29 DIAGNOSIS — H6983 Other specified disorders of Eustachian tube, bilateral: Secondary | ICD-10-CM | POA: Diagnosis not present

## 2023-08-14 ENCOUNTER — Other Ambulatory Visit: Payer: Self-pay | Admitting: Orthopedic Surgery

## 2023-08-14 DIAGNOSIS — M899 Disorder of bone, unspecified: Secondary | ICD-10-CM | POA: Diagnosis not present

## 2023-08-15 DIAGNOSIS — M89312 Hypertrophy of bone, left shoulder: Secondary | ICD-10-CM | POA: Diagnosis not present

## 2023-08-15 DIAGNOSIS — M6158 Other ossification of muscle, other site: Secondary | ICD-10-CM | POA: Diagnosis not present

## 2023-08-15 DIAGNOSIS — Z4789 Encounter for other orthopedic aftercare: Secondary | ICD-10-CM | POA: Insufficient documentation

## 2023-08-15 DIAGNOSIS — M6588 Other synovitis and tenosynovitis, other site: Secondary | ICD-10-CM | POA: Diagnosis not present

## 2023-08-15 DIAGNOSIS — S46292A Other injury of muscle, fascia and tendon of other parts of biceps, left arm, initial encounter: Secondary | ICD-10-CM | POA: Diagnosis not present

## 2023-08-15 DIAGNOSIS — S46212A Strain of muscle, fascia and tendon of other parts of biceps, left arm, initial encounter: Secondary | ICD-10-CM | POA: Diagnosis not present

## 2023-08-20 LAB — SURGICAL PATHOLOGY

## 2023-08-26 DIAGNOSIS — H903 Sensorineural hearing loss, bilateral: Secondary | ICD-10-CM | POA: Diagnosis not present

## 2023-08-26 DIAGNOSIS — H6983 Other specified disorders of Eustachian tube, bilateral: Secondary | ICD-10-CM | POA: Diagnosis not present

## 2023-08-28 DIAGNOSIS — M25622 Stiffness of left elbow, not elsewhere classified: Secondary | ICD-10-CM | POA: Diagnosis not present

## 2023-08-28 DIAGNOSIS — M25522 Pain in left elbow: Secondary | ICD-10-CM | POA: Diagnosis not present

## 2023-08-28 DIAGNOSIS — S46291D Other injury of muscle, fascia and tendon of other parts of biceps, right arm, subsequent encounter: Secondary | ICD-10-CM | POA: Diagnosis not present

## 2023-08-28 DIAGNOSIS — Z4789 Encounter for other orthopedic aftercare: Secondary | ICD-10-CM | POA: Diagnosis not present

## 2023-09-01 DIAGNOSIS — D1801 Hemangioma of skin and subcutaneous tissue: Secondary | ICD-10-CM | POA: Diagnosis not present

## 2023-09-01 DIAGNOSIS — L814 Other melanin hyperpigmentation: Secondary | ICD-10-CM | POA: Diagnosis not present

## 2023-09-01 DIAGNOSIS — L821 Other seborrheic keratosis: Secondary | ICD-10-CM | POA: Diagnosis not present

## 2023-09-01 DIAGNOSIS — Z85828 Personal history of other malignant neoplasm of skin: Secondary | ICD-10-CM | POA: Diagnosis not present

## 2023-09-05 DIAGNOSIS — M25621 Stiffness of right elbow, not elsewhere classified: Secondary | ICD-10-CM | POA: Diagnosis not present

## 2023-09-10 ENCOUNTER — Ambulatory Visit (INDEPENDENT_AMBULATORY_CARE_PROVIDER_SITE_OTHER): Payer: Medicare HMO

## 2023-09-10 DIAGNOSIS — Z23 Encounter for immunization: Secondary | ICD-10-CM | POA: Diagnosis not present

## 2023-09-12 DIAGNOSIS — M25621 Stiffness of right elbow, not elsewhere classified: Secondary | ICD-10-CM | POA: Diagnosis not present

## 2023-09-19 DIAGNOSIS — M25621 Stiffness of right elbow, not elsewhere classified: Secondary | ICD-10-CM | POA: Diagnosis not present

## 2023-09-24 ENCOUNTER — Other Ambulatory Visit: Payer: Self-pay | Admitting: Family Medicine

## 2023-09-24 DIAGNOSIS — Z4789 Encounter for other orthopedic aftercare: Secondary | ICD-10-CM | POA: Diagnosis not present

## 2023-09-24 DIAGNOSIS — M25522 Pain in left elbow: Secondary | ICD-10-CM | POA: Diagnosis not present

## 2023-09-24 DIAGNOSIS — S46291D Other injury of muscle, fascia and tendon of other parts of biceps, right arm, subsequent encounter: Secondary | ICD-10-CM | POA: Diagnosis not present

## 2023-09-24 NOTE — Telephone Encounter (Signed)
To soon for refills not due until med Dec

## 2023-10-03 DIAGNOSIS — M25621 Stiffness of right elbow, not elsewhere classified: Secondary | ICD-10-CM | POA: Diagnosis not present

## 2023-10-13 ENCOUNTER — Other Ambulatory Visit: Payer: Self-pay | Admitting: Family Medicine

## 2023-10-13 DIAGNOSIS — Z1231 Encounter for screening mammogram for malignant neoplasm of breast: Secondary | ICD-10-CM

## 2023-10-15 ENCOUNTER — Other Ambulatory Visit: Payer: Self-pay | Admitting: Family Medicine

## 2023-10-16 ENCOUNTER — Other Ambulatory Visit: Payer: Self-pay | Admitting: Family Medicine

## 2023-10-16 DIAGNOSIS — M25621 Stiffness of right elbow, not elsewhere classified: Secondary | ICD-10-CM | POA: Diagnosis not present

## 2023-10-16 NOTE — Telephone Encounter (Signed)
To soon

## 2023-10-17 ENCOUNTER — Encounter: Payer: Self-pay | Admitting: Family Medicine

## 2023-10-17 NOTE — Telephone Encounter (Signed)
Called [employer] and spoke with [employer] Physiological scientist). She states all three prescriptions were filled on 12/05/22, 02/16/23, 05/01/23 and 07/15/23. Each time the prescriptions were filled early, per Destiny the pt should have medication on hand.  Pt is currently scheduled for CPE on 11/10/23.  Dr. Milinda Antis - please advise if we can refill prescriptions. Thank you.

## 2023-10-19 NOTE — Telephone Encounter (Signed)
Please refill everything once and ask her if she has increased any doses to run out early  Thanks

## 2023-10-20 ENCOUNTER — Encounter: Payer: Self-pay | Admitting: Family Medicine

## 2023-10-20 MED ORDER — OXYBUTYNIN CHLORIDE ER 10 MG PO TB24: 10.0000 mg | ORAL_TABLET | Freq: Every day | ORAL | 0 refills | Status: DC

## 2023-10-20 MED ORDER — ROSUVASTATIN CALCIUM 10 MG PO TABS: 10.0000 mg | ORAL_TABLET | Freq: Every day | ORAL | 0 refills | Status: DC

## 2023-10-20 MED ORDER — TOPIRAMATE 50 MG PO TABS: 50.0000 mg | ORAL_TABLET | Freq: Every day | ORAL | 0 refills | Status: DC

## 2023-10-27 ENCOUNTER — Telehealth: Payer: Self-pay | Admitting: Family Medicine

## 2023-10-27 DIAGNOSIS — E7800 Pure hypercholesterolemia, unspecified: Secondary | ICD-10-CM

## 2023-10-27 DIAGNOSIS — E559 Vitamin D deficiency, unspecified: Secondary | ICD-10-CM

## 2023-10-27 DIAGNOSIS — E538 Deficiency of other specified B group vitamins: Secondary | ICD-10-CM

## 2023-10-27 DIAGNOSIS — R7303 Prediabetes: Secondary | ICD-10-CM

## 2023-10-27 DIAGNOSIS — R5382 Chronic fatigue, unspecified: Secondary | ICD-10-CM

## 2023-10-27 NOTE — Telephone Encounter (Signed)
-----   Message from Lovena Neighbours sent at 10/27/2023  1:54 PM EST ----- Regarding: Fasting labs for Tuesday 11.19.24 Please put physical lab orders in future. Thank you, Denny Peon

## 2023-10-28 ENCOUNTER — Encounter: Payer: Self-pay | Admitting: Family Medicine

## 2023-10-28 ENCOUNTER — Other Ambulatory Visit: Payer: Medicare HMO

## 2023-10-28 ENCOUNTER — Telehealth: Payer: Self-pay | Admitting: Family Medicine

## 2023-10-28 ENCOUNTER — Ambulatory Visit (INDEPENDENT_AMBULATORY_CARE_PROVIDER_SITE_OTHER): Payer: Medicare HMO | Admitting: Family Medicine

## 2023-10-28 VITALS — BP 106/68 | HR 80 | Temp 98.2°F | Ht 66.0 in | Wt 198.5 lb

## 2023-10-28 DIAGNOSIS — R5382 Chronic fatigue, unspecified: Secondary | ICD-10-CM

## 2023-10-28 DIAGNOSIS — E559 Vitamin D deficiency, unspecified: Secondary | ICD-10-CM

## 2023-10-28 DIAGNOSIS — R7303 Prediabetes: Secondary | ICD-10-CM | POA: Diagnosis not present

## 2023-10-28 DIAGNOSIS — E538 Deficiency of other specified B group vitamins: Secondary | ICD-10-CM

## 2023-10-28 DIAGNOSIS — E7800 Pure hypercholesterolemia, unspecified: Secondary | ICD-10-CM

## 2023-10-28 DIAGNOSIS — F509 Eating disorder, unspecified: Secondary | ICD-10-CM | POA: Diagnosis not present

## 2023-10-28 DIAGNOSIS — E66811 Obesity, class 1: Secondary | ICD-10-CM | POA: Diagnosis not present

## 2023-10-28 DIAGNOSIS — S46212S Strain of muscle, fascia and tendon of other parts of biceps, left arm, sequela: Secondary | ICD-10-CM | POA: Diagnosis not present

## 2023-10-28 DIAGNOSIS — G4452 New daily persistent headache (NDPH): Secondary | ICD-10-CM

## 2023-10-28 DIAGNOSIS — F430 Acute stress reaction: Secondary | ICD-10-CM | POA: Diagnosis not present

## 2023-10-28 LAB — COMPREHENSIVE METABOLIC PANEL
ALT: 12 U/L (ref 0–35)
AST: 15 U/L (ref 0–37)
Albumin: 4.5 g/dL (ref 3.5–5.2)
Alkaline Phosphatase: 65 U/L (ref 39–117)
BUN: 22 mg/dL (ref 6–23)
CO2: 31 meq/L (ref 19–32)
Calcium: 9.1 mg/dL (ref 8.4–10.5)
Chloride: 104 meq/L (ref 96–112)
Creatinine, Ser: 0.69 mg/dL (ref 0.40–1.20)
GFR: 90.29 mL/min (ref >60.00)
Glucose, Bld: 96 mg/dL (ref 70–99)
Potassium: 3.6 meq/L (ref 3.5–5.1)
Sodium: 144 meq/L (ref 135–145)
Total Bilirubin: 0.6 mg/dL (ref 0.2–1.2)
Total Protein: 6.6 g/dL (ref 6.0–8.3)

## 2023-10-28 LAB — LIPID PANEL
Cholesterol: 138 mg/dL (ref 0–200)
HDL: 43 mg/dL (ref >39.00)
LDL Cholesterol: 73 mg/dL (ref 0–99)
NonHDL: 94.65
Total CHOL/HDL Ratio: 3
Triglycerides: 106 mg/dL (ref 0.0–149.0)
VLDL: 21.2 mg/dL (ref 0.0–40.0)

## 2023-10-28 LAB — CBC WITH DIFFERENTIAL/PLATELET
Basophils Absolute: 0 10*3/uL (ref 0.0–0.1)
Basophils Relative: 1 % (ref 0.0–3.0)
Eosinophils Absolute: 0.1 10*3/uL (ref 0.0–0.7)
Eosinophils Relative: 2.3 % (ref 0.0–5.0)
HCT: 39.5 % (ref 36.0–46.0)
Hemoglobin: 13.2 g/dL (ref 12.0–15.0)
Lymphocytes Relative: 23.3 % (ref 12.0–46.0)
Lymphs Abs: 1.1 10*3/uL (ref 0.7–4.0)
MCHC: 33.5 g/dL (ref 30.0–36.0)
MCV: 91.3 fL (ref 78.0–100.0)
Monocytes Absolute: 0.4 10*3/uL (ref 0.1–1.0)
Monocytes Relative: 8.7 % (ref 3.0–12.0)
Neutro Abs: 3 10*3/uL (ref 1.4–7.7)
Neutrophils Relative %: 64.7 % (ref 43.0–77.0)
Platelets: 232 10*3/uL (ref 150.0–400.0)
RBC: 4.33 Mil/uL (ref 3.87–5.11)
RDW: 13.1 % (ref 11.5–15.5)
WBC: 4.6 10*3/uL (ref 4.0–10.5)

## 2023-10-28 LAB — TSH: TSH: 2.7 u[IU]/mL (ref 0.35–5.50)

## 2023-10-28 LAB — HEMOGLOBIN A1C: Hgb A1c MFr Bld: 5.1 % (ref 4.6–6.5)

## 2023-10-28 LAB — VITAMIN D 25 HYDROXY (VIT D DEFICIENCY, FRACTURES): VITD: 53.97 ng/mL (ref 30.00–100.00)

## 2023-10-28 LAB — VITAMIN B12: Vitamin B-12: 507 pg/mL (ref 211–911)

## 2023-10-28 MED ORDER — BUPROPION HCL ER (XL) 150 MG PO TB24: 150.0000 mg | ORAL_TABLET | Freq: Every day | ORAL | 1 refills | Status: DC

## 2023-10-28 NOTE — Assessment & Plan Note (Signed)
Pt is an emotional eater -uses as coping mechanism  Making it difficult to loose weight because eating for non hunger reasons Referred for behavioral health therapy

## 2023-10-28 NOTE — Assessment & Plan Note (Addendum)
Co morbidities are joint pain and prediabetes and OSA Frustrated with this  Emotional eating seems to sabotage her (not an eating disorder however) In past lost weight temporarily with a very low calorie/no carb diet  Not realistic to continue even with mt phase of this  Discussed need to conquer stress and boredom eating behaviors-counseling referral made  Discussed need to retire to free up time for self care  Topamax does not cut appetite Will try wellburrin to see if it helps eating behavior and mood  ? If glp -1 drug would be option in future if emotional eating is improved and if insurance may cover later  Exercise is tricky while she rehabs left bicep-discussed other options for safe exercise  Once cleared strength training would help  31  Minutes were spent today both face to face and in the chart obtaining history, reviewing records and test results, performing exam , educating and discussing treatment options, and placing orders

## 2023-10-28 NOTE — Patient Instructions (Addendum)
Start thinking about timeline to retirement   Aim for minimum 30 minutes of exercise - 5 days per week or more   Keep rehabbing your arm   Consider a trainer ?  I put the referral in for behavioral health counseling (therapy) to discuss stress and coping techniques and emotional eating  Please let us know if you don't hear in 1-2 weeks    Goal Try to get most of your carbohydrates from produce (with the exception of white potatoes) and whole grains Eat less bread/pasta/rice/snack foods/cereals/sweets and other items from the middle of the grocery store (processed carbs) There are lots of healthy proteins that are plant based also   Start some wellbutrin xl 150 mg daily  If any side effects or mood changes that are negative -stop it and let us know       Mediterranean Diet  Why follow it? Research shows. Those who follow the Mediterranean diet have a reduced risk of heart disease  The diet is associated with a reduced incidence of Parkinson's and Alzheimer's diseases People following the diet may have longer life expectancies and lower rates of chronic diseases  The Dietary Guidelines for Americans recommends the Mediterranean diet as an eating plan to promote health and prevent disease  What Is the Mediterranean Diet?  Healthy eating plan based on typical foods and recipes of Mediterranean-style cooking The diet is primarily a plant based diet; these foods should make up a majority of meals   Starches - Plant based foods should make up a majority of meals - They are an important sources of vitamins, minerals, energy, antioxidants, and fiber - Choose whole grains, foods high in fiber and minimally processed items  - Typical grain sources include wheat, oats, barley, corn, brown rice, bulgar, farro, millet, polenta, couscous  - Various types of beans include chickpeas, lentils, fava beans, black beans, white beans   Fruits  Veggies - Large quantities of antioxidant rich fruits &  veggies; 6 or more servings  - Vegetables can be eaten raw or lightly drizzled with oil and cooked  - Vegetables common to the traditional Mediterranean Diet include: artichokes, arugula, beets, broccoli, brussel sprouts, cabbage, carrots, celery, collard greens, cucumbers, eggplant, kale, leeks, lemons, lettuce, mushrooms, okra, onions, peas, peppers, potatoes, pumpkin, radishes, rutabaga, shallots, spinach, sweet potatoes, turnips, zucchini - Fruits common to the Mediterranean Diet include: apples, apricots, avocados, cherries, clementines, dates, figs, grapefruits, grapes, melons, nectarines, oranges, peaches, pears, pomegranates, strawberries, tangerines  Fats - Replace butter and margarine with healthy oils, such as olive oil, canola oil, and tahini  - Limit nuts to no more than a handful a day  - Nuts include walnuts, almonds, pecans, pistachios, pine nuts  - Limit or avoid candied, honey roasted or heavily salted nuts - Olives are central to the [employer] - can be eaten whole or used in a variety of dishes   Meats Protein - Limiting red meat: no more than a few times a month - When eating red meat: choose lean cuts and keep the portion to the size of deck of cards - Eggs: approx. 0 to 4 times a week  - Fish and lean poultry: at least 2 a week  - Healthy protein sources include, chicken, [place], lean beef, lamb - Increase intake of seafood such as tuna, salmon, trout, mackerel, shrimp, scallops - Avoid or limit high fat processed meats such as sausage and bacon  Dairy - Include moderate amounts of low fat dairy products  -  Focus on healthy dairy such as fat free yogurt, skim milk, low or reduced fat cheese - Limit dairy products higher in fat such as whole or 2% milk, cheese, ice cream  Alcohol - Moderate amounts of red wine is ok  - No more than 5 oz daily for women (all ages) and men older than age 58  - No more than 10 oz of wine daily for men younger than 67  Other - Limit  sweets and other desserts  - Use herbs and spices instead of salt to flavor foods  - Herbs and spices common to the traditional Mediterranean Diet include: basil, bay leaves, chives, cloves, cumin, fennel, garlic, lavender, marjoram, mint, oregano, parsley, pepper, rosemary, sage, savory, sumac, tarragon, thyme   It's not just a diet, it's a lifestyle:  The Mediterranean diet includes lifestyle factors typical of those in the region  Foods, drinks and meals are best eaten with others and savored Daily physical activity is important for overall good health This could be strenuous exercise like running and aerobics This could also be more leisurely activities such as walking, housework, yard-work, or taking the stairs Moderation is the key; a balanced and healthy diet accommodates most foods and drinks Consider portion sizes and frequency of consumption of certain foods   Meal Ideas & Options:  Breakfast:  Whole wheat toast or whole wheat English muffins with peanut butter & hard boiled egg Steel cut oats topped with apples & cinnamon and skim milk  Fresh fruit: banana, strawberries, melon, berries, peaches  Smoothies: strawberries, bananas, greek yogurt, peanut butter Low fat greek yogurt with blueberries and granola  Egg white omelet with spinach and mushrooms Breakfast couscous: whole wheat couscous, apricots, skim milk, cranberries  Sandwiches:  Hummus and grilled vegetables (peppers, zucchini, squash) on whole wheat bread   Grilled chicken on whole wheat pita with lettuce, tomatoes, cucumbers or tzatziki  [place] salad on whole wheat bread: tuna salad made with greek yogurt, olives, red peppers, capers, green onions Garlic rosemary lamb pita: lamb sauted with garlic, rosemary, salt & pepper; add lettuce, cucumber, greek yogurt to pita - flavor with lemon juice and black pepper  Seafood:  Mediterranean grilled salmon, seasoned with garlic, basil, parsley, lemon juice and black  pepper Shrimp, lemon, and spinach whole-grain pasta salad made with low fat greek yogurt  Seared scallops with lemon orzo  Seared tuna steaks seasoned salt, pepper, coriander topped with tomato mixture of olives, tomatoes, olive oil, minced garlic, parsley, green onions and cappers  Meats:  Herbed greek chicken salad with kalamata olives, cucumber, feta  Red bell peppers stuffed with spinach, bulgur, lean ground beef (or lentils) & topped with feta   Kebabs: skewers of chicken, tomatoes, onions, zucchini, squash  [place] burgers: made with red onions, mint, dill, lemon juice, feta cheese topped with roasted red peppers Vegetarian Cucumber salad: cucumbers, artichoke hearts, celery, red onion, feta cheese, tossed in olive oil & lemon juice  Hummus and whole grain pita points with a greek salad (lettuce, tomato, feta, olives, cucumbers, red onion) Lentil soup with celery, carrots made with vegetable broth, garlic, salt and pepper  Tabouli salad: parsley, bulgur, mint, scallions, cucumbers, tomato, radishes, lemon juice, olive oil, salt and pepper.

## 2023-10-28 NOTE — Assessment & Plan Note (Signed)
A1c today.  

## 2023-10-28 NOTE — Progress Notes (Signed)
Subjective:    Patient ID: Pamela Anthony, female    DOB: [birth year], 67 y.o.   MRN: [id number]  HPI  Wt Readings from Last 3 Encounters:  10/28/23 198 lb 8 oz (90 kg)  07/08/23 187 lb (84.8 kg)  11/04/22 174 lb (78.9 kg)   32.04 kg/m  Vitals:   10/28/23 0757  BP: 106/68  Pulse: 80  Temp: 98.2 F (36.8 C)  SpO2: 98%    Pt presents with c/o sudden weight increase/obesity  Also here for pre physical labs   Previously in [place] weight loss program in 2023  Lost 50 lb  Gained 25 lb back   She ate clean / 900 calories  She tolerated well  No diary/ no processed foods / no gluten  Basically meat and veg Just a little fruit / low sugar type  No whole grains   Then mt phase  Added back a little of everything - did it for a year  Did well  It was fairly healthy   In the beginning no exercise at all the just walking   Stress eater  Especially at work (does try to get out of room)  Also boredom eater  Has never talked to anyone   Very frustrated  Feels awful   Has put in for social security  Plan is to retire in a year May be able to shorten that   Now gained 20 lb back  Has been stress eating   Healing an injury  6 months before full function   Checked with insurance  Wellbutrin is paid for  Has never taken   Topamax-on already -does not cut appetite     Co morbid conditions Osa  HH/ GERD Prediabetes  Lab Results  Component Value Date   HGBA1C 5.1 10/28/2023  Hyperlipidemia   History of low D Last vitamin D Lab Results  Component Value Date   VD25OH 53.97 10/28/2023   History of low B12 Lab Results  Component Value Date   VITAMINB12 507 10/28/2023    Getting hearing aides-this should cut stress   Still has pain when she coughs  ENT said everything looked good      Patient Active Problem List   Diagnosis Date Noted   Disordered eating 10/28/2023   Obesity (BMI 30.0-34.9) 10/28/2023   Head pain 07/08/2023   Biceps  muscle tear, left, sequela 07/08/2023   Welcome to Medicare preventive visit 11/04/2022   Estrogen deficiency 11/04/2022   Paresthesia of left leg 05/14/2022   Vaso-vagal reaction 03/29/2022   Elevated antinuclear antibody (ANA) level 09/20/2020   Colon cancer screening 03/18/2018   Fatigue 03/18/2018   Prediabetes 03/16/2015   Stress reaction 01/16/2015   Encounter for routine gynecological examination 09/01/2013   Depression with anxiety 01/18/2013   Post-menopausal 08/14/2012   Other screening mammogram 04/29/2011   Routine general medical examination at a health care facility 04/22/2011   Joint pain 02/28/2010   Myofascial pain syndrome 02/28/2010   Vitamin D deficiency 03/08/2009   IRRITABLE BOWEL SYNDROME 06/27/2008   B12 deficiency 02/23/2008   POLYP, COLON 03/06/2007   Hyperlipidemia 03/06/2007   GERD 03/06/2007   Diaphragmatic hernia 03/06/2007   OSA (obstructive sleep apnea) 03/06/2007   URINARY INCONTINENCE 03/06/2007   Migraine 03/06/2007   Past Medical History:  Diagnosis Date   Allergic rhinitis    Allergy    Anxiety    B12 deficiency    Degenerative disc disease    back, knee   Family  history of adverse reaction to anesthesia    Fungal infection 2005   lamasil    GERD (gastroesophageal reflux disease)    EGD 10/2004   Hemorrhoids, internal    colonoscopy 02/2008   Hyperlipemia    Iron deficiency anemia    Low back pain    Migraine headache    improved with medications   Obesity    Orthodontics    [facility]   Sleep apnea    no cpap   Urinary incontinence    Vaso-vagal reaction    occured after eye drops for cataract surgery, but pt reports this has happened before during medical procedures   Vitamin D deficiency    mild   Past Surgical History:  Procedure Laterality Date   CATARACT EXTRACTION W/PHACO Left 03/25/2022   Procedure: CATARACT EXTRACTION PHACO AND INTRAOCULAR LENS PLACEMENT (IOC) LEFT;  Surgeon: Lockie Mola, MD;  Location:  [facility];  Service: Ophthalmology;  Laterality: Left;  8.88 1:13.0   CATARACT EXTRACTION W/PHACO Right 04/17/2022   Procedure: CATARACT EXTRACTION PHACO AND INTRAOCULAR LENS PLACEMENT (IOC) RIGHT 7.22 01:22.1;  Surgeon: Lockie Mola, MD;  Location: [facility];  Service: Ophthalmology;  Laterality: Right;  sleep apnea   CESAREAN SECTION  1985   COLONOSCOPY     DILATION AND CURETTAGE OF UTERUS  2005   HAND SURGERY  2000   nodule removed   KNEE SURGERY  1970's   right   NASAL SINUS SURGERY  2000's   right maxillary   TONSILLECTOMY AND ADENOIDECTOMY     Social History   Tobacco Use   Smoking status: Never   Smokeless tobacco: Never  Vaping Use   Vaping status: Never Used  Substance Use Topics   Alcohol use: Yes    Alcohol/week: 0.0 standard drinks of alcohol    Comment: occasional   Drug use: No   Family History  Problem Relation Age of Onset   Coronary artery disease Father        in his 73's   Arthritis Father    Stroke Father    Heart disease Father        pacemaker   Prostate cancer Father    Breast cancer Maternal Grandmother    Colon cancer Other    Breast cancer Cousin        mat cousin   Colon polyps Neg Hx    Allergies  Allergen Reactions   Atorvastatin     REACTION: myalgias   Fluogen [Influenza Virus Vaccine]     High dose  Felt sick/achy    Haemophilus B Polysaccharide Vaccine     High dose  Felt sick/achy   Current Outpatient Medications on File Prior to Visit  Medication Sig Dispense Refill   Cholecalciferol (VITAMIN D-3 PO) Take 1 tablet by mouth daily.     Cyanocobalamin (B-12 PO) Take by mouth daily.     FLUoxetine (PROZAC) 20 MG capsule TAKE 3 CAPSULES EVERY DAY (Patient taking differently: Take 40 mg by mouth daily.) 270 capsule 2   fluticasone (FLONASE) 50 MCG/ACT nasal spray USE 2 SPRAYS IN EACH NOSTRIL EVERY DAY 48 g 3   mometasone (ELOCON) 0.1 % cream   0   omeprazole (PRILOSEC) 20 MG capsule TAKE 1 CAPSULE  EVERY DAY (Patient taking differently: Take 20 mg by mouth every other day.) 90 capsule 3   oxybutynin (DITROPAN-XL) 10 MG 24 hr tablet Take 1 tablet (10 mg total) by mouth at bedtime. 90 tablet 0   Probiotic  Product (ALIGN PO) Take by mouth daily.     rosuvastatin (CRESTOR) 10 MG tablet Take 1 tablet (10 mg total) by mouth daily. 90 tablet 0   topiramate (TOPAMAX) 50 MG tablet Take 1 tablet (50 mg total) by mouth at bedtime. 90 tablet 0   No current facility-administered medications on file prior to visit.    Review of Systems  Constitutional:  Positive for fatigue. Negative for activity change, appetite change, fever and unexpected weight change.  HENT:  Negative for congestion, ear pain, rhinorrhea, sinus pressure and sore throat.   Eyes:  Negative for pain, redness and visual disturbance.  Respiratory:  Negative for cough, shortness of breath and wheezing.   Cardiovascular:  Negative for chest pain and palpitations.  Gastrointestinal:  Negative for abdominal pain, blood in stool, constipation and diarrhea.  Endocrine: Negative for polydipsia and polyuria.  Genitourinary:  Negative for dysuria, frequency and urgency.  Musculoskeletal:  Negative for arthralgias, back pain and myalgias.  Skin:  Negative for pallor and rash.  Allergic/Immunologic: Negative for environmental allergies.  Neurological:  Negative for dizziness, syncope and headaches.  Hematological:  Negative for adenopathy. Does not bruise/bleed easily.  Psychiatric/Behavioral:  Negative for decreased concentration, dysphoric mood and self-injury. The patient is nervous/anxious.        High stress       Objective:   Physical Exam Constitutional:      General: She is not in acute distress.    Appearance: Normal appearance. She is obese. She is not ill-appearing or diaphoretic.  Eyes:     Conjunctiva/sclera: Conjunctivae normal.     Pupils: Pupils are equal, round, and reactive to light.  Cardiovascular:     Rate and  Rhythm: Normal rate and regular rhythm.  Pulmonary:     Effort: Pulmonary effort is normal. No respiratory distress.  Musculoskeletal:     Right lower leg: No edema.     Left lower leg: No edema.  Neurological:     Mental Status: She is alert.  Psychiatric:        Attention and Perception: Attention normal.        Mood and Affect: Mood normal.        Cognition and Memory: Cognition and memory normal.     Comments: Stressed  Mildly anx   Candidly discusses symptoms and stressors    Good insight Pleasant            Assessment & Plan:   Problem List Items Addressed This Visit       Musculoskeletal and Integument   Biceps muscle tear, left, sequela    This has made it difficult to get back to strength building exercise         Other   Vitamin D deficiency   Stress reaction    Work stress and lack of interest /boredom are problematic  Lead to stress eating / a harmful coping mechanism   Referral made for [facility] counseling  Pt is open to this Encouraged good self care   Reviewed stressors/ coping techniques/symptoms/ support sources/ tx options and side effects in detail today        Relevant Medications   buPROPion (WELLBUTRIN XL) 150 MG 24 hr tablet   Other Relevant Orders   Ambulatory referral to Psychology   Prediabetes    A1c today       Obesity (BMI 30.0-34.9) - Primary    Co morbidities are joint pain and prediabetes and OSA Frustrated with this  Emotional eating  seems to sabotage her (not an eating disorder however) In past lost weight temporarily with a very low calorie/no carb diet  Not realistic to continue even with mt phase of this  Discussed need to conquer stress and boredom eating behaviors-counseling referral made  Discussed need to retire to free up time for self care  Topamax does not cut appetite Will try wellburrin to see if it helps eating behavior and mood  ? If glp -1 drug would be option in future if emotional eating is improved and  if insurance may cover later  Exercise is tricky while she rehabs left bicep-discussed other options for safe exercise  Once cleared strength training would help  31  Minutes were spent today both face to face and in the chart obtaining history, reviewing records and test results, performing exam , educating and discussing treatment options, and placing orders          Hyperlipidemia   Fatigue   Disordered eating    Pt is an emotional eater -uses as coping mechanism  Making it difficult to loose weight because eating for non hunger reasons Referred for behavioral health therapy       Relevant Orders   Ambulatory referral to Psychology   B12 deficiency

## 2023-10-28 NOTE — Assessment & Plan Note (Signed)
This has made it difficult to get back to strength building exercise

## 2023-10-28 NOTE — Assessment & Plan Note (Signed)
Work stress and lack of interest /boredom are problematic  Lead to stress eating / a harmful coping mechanism   Referral made for [facility] counseling  Pt is open to this Encouraged good self care   Reviewed stressors/ coping techniques/symptoms/ support sources/ tx options and side effects in detail today

## 2023-10-28 NOTE — Telephone Encounter (Signed)
On they way out of the room yesterday pt mentioned head pain that is persistent  I think she mentioned ENT visit? I cannot find one in chart -please call for that note I need to review before I can make a plan  Thanks  Please ask her where/when and send for

## 2023-10-29 NOTE — Telephone Encounter (Signed)
I reviewed the sept note, they mentioned eustachian tube dysfunction (ear fullness) but not headache or head pain at all   We ran out of time at her last visit to discuss it (was here for something else)    but again-where is her pain and is it sharp/dull or throbbing? Any other neurologic symptoms ?  Thanks so much

## 2023-10-29 NOTE — Telephone Encounter (Signed)
Patient called back she has been seen at [facility]. I have added provider to care team. I will call office to get notes   I have called office the last 2 visit were with audiology the last time she was seen by provider was in September they will send that note now

## 2023-10-29 NOTE — Telephone Encounter (Signed)
Left message to return call to our office.  

## 2023-10-30 NOTE — Telephone Encounter (Signed)
I put order in for MRI of brain with /without contrast  Let's see if it gets approved by insurance Thanks for the heads up   Please let us know if you don't hear in 1-2 weeks   If at any time symptoms become severe or you develop stroke symptoms- call 911

## 2023-10-30 NOTE — Telephone Encounter (Signed)
Called and spoke with patient, reviewed all information.    Pt states the pain is from the left temple up to the top of head.  She describes the pain as throbbing. Pain normally occurs when patient bends over or coughs/sneezes. Pt declines any other neurologic symptoms.

## 2023-10-30 NOTE — Assessment & Plan Note (Signed)
Left tempie rad to top of head  Has seen ENT No improvement with treatment of sinus infection   Will order MRI and see if covered

## 2023-10-30 NOTE — Telephone Encounter (Signed)
I would also like to get labs for a sed rate and crp since headache was in temple area (to rule out temporal arteritis) Please schedule non fasting labs

## 2023-10-31 ENCOUNTER — Other Ambulatory Visit (INDEPENDENT_AMBULATORY_CARE_PROVIDER_SITE_OTHER): Payer: Medicare HMO

## 2023-10-31 DIAGNOSIS — G4452 New daily persistent headache (NDPH): Secondary | ICD-10-CM | POA: Diagnosis not present

## 2023-10-31 DIAGNOSIS — M25621 Stiffness of right elbow, not elsewhere classified: Secondary | ICD-10-CM | POA: Diagnosis not present

## 2023-10-31 DIAGNOSIS — M25622 Stiffness of left elbow, not elsewhere classified: Secondary | ICD-10-CM | POA: Diagnosis not present

## 2023-10-31 LAB — SEDIMENTATION RATE: Sed Rate: 6 mm/h (ref 0–30)

## 2023-10-31 LAB — C-REACTIVE PROTEIN: CRP: <1 mg/dL (ref 0.5–20.0)

## 2023-10-31 NOTE — Telephone Encounter (Signed)
Called patient they have reached out to set up MRI. Scheduled 11/25/23. She will come by today for labs. She is aware if any red words she will call 911.

## 2023-10-31 NOTE — Telephone Encounter (Signed)
Aware, thanks!

## 2023-11-02 ENCOUNTER — Telehealth: Payer: Self-pay | Admitting: Family Medicine

## 2023-11-02 NOTE — Telephone Encounter (Signed)
Just did a bunch of labs-none needed

## 2023-11-02 NOTE — Telephone Encounter (Signed)
-----   Message from Lovena Neighbours sent at 10/17/2023  1:26 PM EST ----- Regarding: Labs for Monday 11.25.24 Please put physical fasting lab orders in future. Thank you, Denny Peon

## 2023-11-03 ENCOUNTER — Other Ambulatory Visit: Payer: Medicare HMO

## 2023-11-04 ENCOUNTER — Ambulatory Visit (INDEPENDENT_AMBULATORY_CARE_PROVIDER_SITE_OTHER): Payer: Medicare HMO

## 2023-11-04 VITALS — Ht 66.0 in | Wt 196.0 lb

## 2023-11-04 DIAGNOSIS — Z0000 Encounter for general adult medical examination without abnormal findings: Secondary | ICD-10-CM | POA: Diagnosis not present

## 2023-11-04 NOTE — Patient Instructions (Signed)
[name] , Thank you for taking time to come for your Medicare Wellness Visit. I appreciate your ongoing commitment to your health goals. Please review the following plan we discussed and let me know if I can assist you in the future.   Referrals/Orders/Follow-Ups/Clinician Recommendations: none  This is a list of the screening recommended for you and due dates:  Health Maintenance  Topic Date Due   Mammogram  10/10/2023   COVID-19 Vaccine (5 - 2023-24 season) 11/12/2025*   DTaP/Tdap/Td vaccine (3 - Td or Tdap) 08/31/2024   Medicare Annual Wellness Visit  11/03/2024   Colon Cancer Screening  04/20/2028   Pneumonia Vaccine  Completed   Flu Shot  Completed   DEXA scan (bone density measurement)  Completed   Hepatitis C Screening  Completed   Zoster (Shingles) Vaccine  Completed   HPV Vaccine  Aged Out  *Topic was postponed. The date shown is not the original due date.    Advanced directives: (Declined) Advance directive discussed with you today. Even though you declined this today, please call our office should you change your mind, and we can give you the proper paperwork for you to fill out.  Next Medicare Annual Wellness Visit scheduled for next year: Yes 11/05/24 @ 9:30am telephone

## 2023-11-04 NOTE — Progress Notes (Addendum)
Subjective:   Pamela Anthony is a 67 y.o. female who presents for an Initial Medicare Annual Wellness Visit.  Visit Complete: Virtual I connected with  [name] on 11/04/23 by a audio enabled telemedicine application and verified that I am speaking with the correct person using two identifiers.  Patient Location: Home  Provider Location: Home Office  I discussed the limitations of evaluation and management by telemedicine. The patient expressed understanding and agreed to proceed.  Vital Signs: Because this visit was a virtual/telehealth visit, some criteria may be missing or patient reported. Any vitals not documented were not able to be obtained and vitals that have been documented are patient reported.  Patient Medicare AWV questionnaire was completed by the patient on 10/28/23; I have confirmed that all information answered by patient is correct and no changes since this date. Cardiac Risk Factors include: advanced age (>35men, >39 women);dyslipidemia;obesity (BMI >30kg/m2)    Objective:    Today's Vitals   11/04/23 0958  Weight: 196 lb (88.9 kg)  Height: 5\' 6"  (1.676 m)   Body mass index is 31.64 kg/m.     11/04/2023   10:06 AM 03/25/2022    7:13 AM 05/09/2021    9:55 AM 04/20/2018   12:34 PM  Advanced Directives  Does Patient Have a Medical Advance Directive? No No No No  Would patient like information on creating a medical advance directive?  No - Patient declined  No - Patient declined    Current Medications (verified) Outpatient Encounter Medications as of 11/04/2023  Medication Sig   buPROPion (WELLBUTRIN XL) 150 MG 24 hr tablet Take 1 tablet (150 mg total) by mouth daily.   Cholecalciferol (VITAMIN D-3 PO) Take 1 tablet by mouth daily.   Cyanocobalamin (B-12 PO) Take by mouth daily.   FLUoxetine (PROZAC) 20 MG capsule TAKE 3 CAPSULES EVERY DAY (Patient taking differently: Take 40 mg by mouth daily.)   fluticasone (FLONASE) 50 MCG/ACT nasal spray USE 2  SPRAYS IN EACH NOSTRIL EVERY DAY   omeprazole (PRILOSEC) 20 MG capsule TAKE 1 CAPSULE EVERY DAY (Patient taking differently: Take 20 mg by mouth every other day.)   oxybutynin (DITROPAN-XL) 10 MG 24 hr tablet Take 1 tablet (10 mg total) by mouth at bedtime.   Probiotic Product (ALIGN PO) Take by mouth daily.   rosuvastatin (CRESTOR) 10 MG tablet Take 1 tablet (10 mg total) by mouth daily.   topiramate (TOPAMAX) 50 MG tablet Take 1 tablet (50 mg total) by mouth at bedtime.   mometasone (ELOCON) 0.1 % cream  (Patient not taking: Reported on 11/04/2023)   No facility-administered encounter medications on file as of 11/04/2023.    Allergies (verified) Atorvastatin, Fluogen [influenza virus vaccine], and Haemophilus b polysaccharide vaccine   History: Past Medical History:  Diagnosis Date   Allergic rhinitis    Allergy    Anxiety    B12 deficiency    Degenerative disc disease    back, knee   Family history of adverse reaction to anesthesia    Fungal infection 2005   lamasil    GERD (gastroesophageal reflux disease)    EGD 10/2004   Hemorrhoids, internal    colonoscopy 02/2008   Hyperlipemia    Iron deficiency anemia    Low back pain    Migraine headache    improved with medications   Obesity    Orthodontics    InvisAlign   Sleep apnea    no cpap   Urinary incontinence    Vaso-vagal reaction  occured after eye drops for cataract surgery, but pt reports this has happened before during medical procedures   Vitamin D deficiency    mild   Past Surgical History:  Procedure Laterality Date   CATARACT EXTRACTION W/PHACO Left 03/25/2022   Procedure: CATARACT EXTRACTION PHACO AND INTRAOCULAR LENS PLACEMENT (IOC) LEFT;  Surgeon: Lockie Mola, MD;  Location: [facility];  Service: Ophthalmology;  Laterality: Left;  8.88 1:13.0   CATARACT EXTRACTION W/PHACO Right 04/17/2022   Procedure: CATARACT EXTRACTION PHACO AND INTRAOCULAR LENS PLACEMENT (IOC) RIGHT 7.22 01:22.1;   Surgeon: Lockie Mola, MD;  Location: [facility];  Service: Ophthalmology;  Laterality: Right;  sleep apnea   CESAREAN SECTION  1985   COLONOSCOPY     DILATION AND CURETTAGE OF UTERUS  2005   HAND SURGERY  2000   nodule removed   KNEE SURGERY  1970's   right   NASAL SINUS SURGERY  2000's   right maxillary   TONSILLECTOMY AND ADENOIDECTOMY     Family History  Problem Relation Age of Onset   Coronary artery disease Father        in his 53's   Arthritis Father    Stroke Father    Heart disease Father        pacemaker   Prostate cancer Father    Breast cancer Maternal Grandmother    Colon cancer Other    Breast cancer Cousin        mat cousin   Colon polyps Neg Hx    Social History   Socioeconomic History   Marital status: Married    Spouse name: Not on file   Number of children: 2   Years of education: Not on file   Highest education level: 12th grade  Occupational History   Occupation: customer service  Tobacco Use   Smoking status: Never   Smokeless tobacco: Never  Vaping Use   Vaping status: Never Used  Substance and Sexual Activity   Alcohol use: Yes    Alcohol/week: 0.0 standard drinks of alcohol    Comment: occasional   Drug use: No   Sexual activity: Not on file  Other Topics Concern   Not on file  Social History Narrative   Not on file   Social Determinants of Health   Financial Resource Strain: Low Risk  (10/28/2023)   Overall Financial Resource Strain (CARDIA)    Difficulty of Paying Living Expenses: Not hard at all  Food Insecurity: No Food Insecurity (10/28/2023)   Hunger Vital Sign    Worried About Running Out of Food in the Last Year: Never true    Ran Out of Food in the Last Year: Never true  Transportation Needs: No Transportation Needs (10/28/2023)   PRAPARE - Administrator, Civil Service (Medical): No    Lack of Transportation (Non-Medical): No  Physical Activity: Insufficiently Active (10/28/2023)    Exercise Vital Sign    Days of Exercise per Week: 3 days    Minutes of Exercise per Session: 30 min  Stress: No Stress Concern Present (10/28/2023)   [employer] of Occupational Health - Occupational Stress Questionnaire    Feeling of Stress : Only a little  Recent Concern: Stress - Stress Concern Present (10/27/2023)   [employer] of Occupational Health - Occupational Stress Questionnaire    Feeling of Stress : To some extent  Social Connections: Socially Isolated (10/28/2023)   Social Connection and Isolation Panel [NHANES]    Frequency of Communication with Friends  and Family: Once a week    Frequency of Social Gatherings with Friends and Family: Once a week    Attends Religious Services: Never    Database administrator or Organizations: No    Attends Engineer, structural: Never    Marital Status: Married    Tobacco Counseling Counseling given: Not Answered  Clinical Intake:  Pre-visit preparation completed: Yes  Pain : No/denies pain   BMI - recorded: 31.64 Nutritional Status: BMI > 30  Obese Nutritional Risks: None Diabetes: No  How often do you need to have someone help you when you read instructions, pamphlets, or other written materials from your doctor or pharmacy?: 1 - Never  Interpreter Needed?: No  Comments: lives with husband and son Information entered by :: B.Binh Doten,LPN   Activities of Daily Living    10/28/2023   10:43 AM  In your present state of health, do you have any difficulty performing the following activities:  Hearing? 0  Vision? 0  Difficulty concentrating or making decisions? 0  Walking or climbing stairs? 0  Dressing or bathing? 0  Doing errands, shopping? 0  Preparing Food and eating ? N  Using the Toilet? N  In the past six months, have you accidently leaked urine? N  Do you have problems with loss of bowel control? N  Managing your Medications? N  Managing your Finances? N  Housekeeping or managing your  Housekeeping? N    Patient Care Team: Tower, Audrie Gallus, MD as PCP - General Gigi Gin, [place] (Otolaryngology) Lockie Mola, MD as Referring Physician (Ophthalmology)  Indicate any recent Medical Services you may have received from other than [facility] providers in the past year (date may be approximate).     Assessment:   This is a routine wellness examination for Kasara.  Hearing/Vision screen Hearing Screening - Comments:: Pt says her hearing has changed has hearing aid pick up next week  Vision Screening - Comments:: Pt says she only wears readers [facility]   Goals Addressed             This Visit's Progress    Weight (lb) < 200 lb (90.7 kg)   196 lb (88.9 kg)    Pt says she would like to lose 20lbs by changing her eating to more mediterranean style, more walking       Depression Screen    11/04/2023   10:05 AM 10/28/2023    8:05 AM 07/08/2023    2:09 PM 11/04/2022    9:23 AM 05/14/2022    3:00 PM 10/31/2021    2:45 PM 09/14/2020    3:31 PM  PHQ 2/9 Scores  PHQ - 2 Score 0 2 0 0 0 0 0  PHQ- 9 Score  5 0 0  0     Fall Risk    10/28/2023   10:43 AM 10/28/2023    8:05 AM 07/08/2023    2:09 PM 11/04/2022    9:23 AM 05/14/2022    3:00 PM  Fall Risk   Falls in the past year? 0 0 0 0 0  Number falls in past yr: 0 0 0 0   Injury with Fall? 0 0 0 0   Risk for fall due to : No Fall Risks No Fall Risks No Fall Risks    Follow up Falls prevention discussed;Education provided Falls evaluation completed Falls evaluation completed Falls evaluation completed Falls evaluation completed    MEDICARE RISK AT HOME: Medicare Risk at Home Any stairs  in or around the home?: Yes If so, are there any without handrails?: Yes Home free of loose throw rugs in walkways, pet beds, electrical cords, etc?: No Adequate lighting in your home to reduce risk of falls?: Yes Life alert?: No Use of a cane, walker or w/c?: No Grab bars in the bathroom?: No Shower chair or bench  in shower?: No Elevated toilet seat or a handicapped toilet?: No  TIMED UP AND GO:  Was the test performed? No    Cognitive Function:        11/04/2023   10:08 AM  6CIT Screen  What Year? 0 points  What month? 0 points  What time? 0 points  Count back from 20 0 points  Months in reverse 0 points  Repeat phrase 0 points  Total Score 0 points    Immunizations Immunization History  Administered Date(s) Administered   Fluad Quad(high Dose 65+) 08/31/2020, 09/05/2022   Influenza Split 09/05/2011   Influenza Whole 12/10/2003, 08/28/2010   Influenza, Seasonal, Injecte, Preservative Fre 09/10/2023   Influenza,inj,Quad PF,6+ Mos 09/01/2013, 08/31/2014, 09/14/2015, 09/10/2016, 09/09/2017, 09/01/2018, 08/20/2019, 09/07/2021   PFIZER Comirnaty(Gray Top)Covid-19 Tri-Sucrose Vaccine 10/09/2020   PFIZER(Purple Top)SARS-COV-2 Vaccination 02/26/2020, 03/22/2020, 10/09/2020   PNEUMOCOCCAL CONJUGATE-20 11/04/2022   Pneumococcal Polysaccharide-23 09/29/2009, 08/31/2014, 10/27/2019   Td 07/16/2004   Tdap 08/31/2014   Zoster Recombinant(Shingrix) 07/17/2018, 09/12/2018   Zoster, Live 10/05/2014    TDAP status: Up to date  Flu Vaccine status: Up to date  Pneumococcal vaccine status: Up to date  Covid-19 vaccine status: Completed vaccines  Qualifies for Shingles Vaccine? Yes   Zostavax completed Yes   Shingrix Completed?: Yes  Screening Tests Health Maintenance  Topic Date Due   MAMMOGRAM  10/10/2023   COVID-19 Vaccine (5 - 2023-24 season) 11/12/2025 (Originally 08/10/2023)   DTaP/Tdap/Td (3 - Td or Tdap) 08/31/2024   Medicare Annual Wellness (AWV)  11/03/2024   Colonoscopy  04/20/2028   Pneumonia Vaccine 39+ Years old  Completed   INFLUENZA VACCINE  Completed   DEXA SCAN  Completed   Hepatitis C Screening  Completed   Zoster Vaccines- Shingrix  Completed   HPV VACCINES  Aged Out    Health Maintenance  Health Maintenance Due  Topic Date Due   MAMMOGRAM  10/10/2023     Colorectal cancer screening: Type of screening: Colonoscopy. Completed 04/20/2018. Repeat every 10 years  Mammogram status: Completed 10/09/22. Repeat every year scheduled for 11/05/23  Bone Density status: Completed 11/07/2022. Results reflect: Bone density results: NORMAL. Repeat every 5 years.  Lung Cancer Screening: (Low Dose CT Chest recommended if Age 43-80 years, 20 pack-year currently smoking OR have quit w/in 15years.) does not qualify.   Lung Cancer Screening Referral: no  Additional Screening:  Hepatitis C Screening: does not qualify; Completed 10/31/2021  Vision Screening: Recommended annual ophthalmology exams for early detection of glaucoma and other disorders of the eye. Is the patient up to date with their annual eye exam?  Yes  Who is the provider or what is the name of the office in which the patient attends annual eye exams? [facility] If pt is not established with a provider, would they like to be referred to a provider to establish care? No .   Dental Screening: Recommended annual dental exams for proper oral hygiene  Diabetic Foot Exam: n/a  Community Resource Referral / Chronic Care Management: CRR required this visit?  No   CCM required this visit?  No     Plan:  I have personally reviewed and noted the following in the patient's chart:   Medical and social history Use of alcohol, tobacco or illicit drugs  Current medications and supplements including opioid prescriptions. Patient is not currently taking opioid prescriptions. Functional ability and status Nutritional status Physical activity Advanced directives List of other physicians Hospitalizations, surgeries, and ER visits in previous 12 months Vitals Screenings to include cognitive, depression, and falls Referrals and appointments  In addition, I have reviewed and discussed with patient certain preventive protocols, quality metrics, and best practice recommendations. A written  personalized care plan for preventive services as well as general preventive health recommendations were provided to patient.     Sue Lush, LPN   [id number]/09/[id number]   After Visit Summary: (MyChart) Due to this being a telephonic visit, the after visit summary with patients personalized plan was offered to patient via MyChart   Nurse Notes: Pt states she has been doing alright until recent left heel pain and soreness she is experiencing when walking for the last couple of days. She desires to inquire to Pcp at PE appt 11/10/23. No other questions or concerns at this time.

## 2023-11-05 ENCOUNTER — Ambulatory Visit
Admission: RE | Admit: 2023-11-05 | Discharge: 2023-11-05 | Disposition: A | Discharge status: Home / Self Care | Payer: Medicare HMO | Source: Ambulatory Visit | Attending: Family Medicine | Admitting: Family Medicine

## 2023-11-05 DIAGNOSIS — Z1231 Encounter for screening mammogram for malignant neoplasm of breast: Secondary | ICD-10-CM | POA: Diagnosis not present

## 2023-11-10 ENCOUNTER — Encounter: Payer: Self-pay | Admitting: Family Medicine

## 2023-11-10 ENCOUNTER — Ambulatory Visit: Payer: Medicare HMO | Admitting: Family Medicine

## 2023-11-10 VITALS — BP 132/74 | HR 76 | Temp 98.3°F | Ht 66.0 in | Wt 198.4 lb

## 2023-11-10 DIAGNOSIS — Z0000 Encounter for general adult medical examination without abnormal findings: Secondary | ICD-10-CM

## 2023-11-10 DIAGNOSIS — E538 Deficiency of other specified B group vitamins: Secondary | ICD-10-CM | POA: Diagnosis not present

## 2023-11-10 DIAGNOSIS — G4452 New daily persistent headache (NDPH): Secondary | ICD-10-CM

## 2023-11-10 DIAGNOSIS — R5382 Chronic fatigue, unspecified: Secondary | ICD-10-CM | POA: Diagnosis not present

## 2023-11-10 DIAGNOSIS — E66811 Obesity, class 1: Secondary | ICD-10-CM | POA: Diagnosis not present

## 2023-11-10 DIAGNOSIS — R7303 Prediabetes: Secondary | ICD-10-CM

## 2023-11-10 DIAGNOSIS — E559 Vitamin D deficiency, unspecified: Secondary | ICD-10-CM | POA: Diagnosis not present

## 2023-11-10 DIAGNOSIS — F418 Other specified anxiety disorders: Secondary | ICD-10-CM

## 2023-11-10 DIAGNOSIS — E7800 Pure hypercholesterolemia, unspecified: Secondary | ICD-10-CM

## 2023-11-10 DIAGNOSIS — K219 Gastro-esophageal reflux disease without esophagitis: Secondary | ICD-10-CM

## 2023-11-10 DIAGNOSIS — G4733 Obstructive sleep apnea (adult) (pediatric): Secondary | ICD-10-CM | POA: Diagnosis not present

## 2023-11-10 DIAGNOSIS — G43709 Chronic migraine without aura, not intractable, without status migrainosus: Secondary | ICD-10-CM | POA: Diagnosis not present

## 2023-11-10 DIAGNOSIS — Z6832 Body mass index (BMI) 32.0-32.9, adult: Secondary | ICD-10-CM

## 2023-11-10 DIAGNOSIS — Z1211 Encounter for screening for malignant neoplasm of colon: Secondary | ICD-10-CM

## 2023-11-10 MED ORDER — TOPIRAMATE 50 MG PO TABS: 50.0000 mg | ORAL_TABLET | Freq: Every day | ORAL | 3 refills | Status: DC

## 2023-11-10 MED ORDER — FLUTICASONE PROPIONATE 50 MCG/ACT NA SUSP: 2.0000 | Freq: Every day | NASAL | 3 refills | Status: DC

## 2023-11-10 MED ORDER — OXYBUTYNIN CHLORIDE ER 10 MG PO TB24: 10.0000 mg | ORAL_TABLET | Freq: Every day | ORAL | 3 refills | Status: AC

## 2023-11-10 MED ORDER — ROSUVASTATIN CALCIUM 10 MG PO TABS: 10.0000 mg | ORAL_TABLET | Freq: Every day | ORAL | 3 refills | Status: DC

## 2023-11-10 MED ORDER — FLUOXETINE HCL 20 MG PO CAPS: 40.0000 mg | ORAL_CAPSULE | Freq: Every day | ORAL | 3 refills | Status: DC

## 2023-11-10 MED ORDER — BUPROPION HCL ER (XL) 150 MG PO TB24: 150.0000 mg | ORAL_TABLET | Freq: Every day | ORAL | 3 refills | Status: DC

## 2023-11-10 MED ORDER — OMEPRAZOLE 20 MG PO CPDR: 20.0000 mg | DELAYED_RELEASE_CAPSULE | Freq: Every day | ORAL | 1 refills | Status: AC

## 2023-11-10 MED ORDER — FLUOXETINE HCL 20 MG PO CAPS: 40.0000 mg | ORAL_CAPSULE | Freq: Every day | ORAL | 3 refills | Status: AC

## 2023-11-10 NOTE — Assessment & Plan Note (Signed)
Unsure if current head pain is migraine related MRI pending   Continues topamax for prophylaxis

## 2023-11-10 NOTE — Assessment & Plan Note (Signed)
MRI planned 12/17  Call back and Er precautions noted in detail today

## 2023-11-10 NOTE — Assessment & Plan Note (Signed)
Disc goals for lipids and reasons to control them Rev last labs with pt Rev low sat fat diet in detail LDL 73 Taking crestor 10 mg daily -encouraged to continue it

## 2023-11-10 NOTE — Assessment & Plan Note (Signed)
Not using cpap since weight loss

## 2023-11-10 NOTE — Assessment & Plan Note (Signed)
Reviewed health habits including diet and exercise and skin cancer prevention Reviewed appropriate screening tests for age  Also reviewed health mt list, fam hx and immunization status , as well as social and family history   See HPI Labs reviewed and ordered Mammogram report pending from last wk  Colonoscopy 2019 with 10 y recall  Dexa  utd  Discussed fall prevention, supplements and exercise for bone density  PHQ 1   Health Maintenance  Topic Date Due   COVID-19 Vaccine (5 - 2023-24 season) 11/12/2025*   DTaP/Tdap/Td vaccine (3 - Td or Tdap) 08/31/2024   Mammogram  11/04/2024   DEXA scan (bone density measurement)  11/07/2024   Medicare Annual Wellness Visit  11/09/2024   Colon Cancer Screening  04/20/2028   Pneumonia Vaccine  Completed   Flu Shot  Completed   Hepatitis C Screening  Completed   Zoster (Shingles) Vaccine  Completed   HPV Vaccine  Aged Out  *Topic was postponed. The date shown is not the original due date.

## 2023-11-10 NOTE — Assessment & Plan Note (Signed)
Ongoing Suspect multi factorial  Labs reviewed

## 2023-11-10 NOTE — Assessment & Plan Note (Signed)
Discussed how this problem influences overall health and the risks it imposes  Reviewed plan for weight loss with lower calorie diet (via better food choices (lower glycemic and portion control) along with exercise building up to or more than 30 minutes 5 days per week including some aerobic activity and strength training    Pending counseling appointment to discuss mood and emotional eating

## 2023-11-10 NOTE — Progress Notes (Signed)
Subjective:    Patient ID: Pamela Anthony, female    DOB: 1956-03-27, 67 y.o.   MRN: [id number]  HPI  Here for health maintenance exam and to review chronic medical problems   Wt Readings from Last 3 Encounters:  11/10/23 198 lb 6 oz (90 kg)  11/04/23 196 lb (88.9 kg)  10/28/23 198 lb 8 oz (90 kg)   32.02 kg/m  Vitals:   11/10/23 0957  BP: 132/74  Pulse: 76  Temp: 98.3 F (36.8 C)  SpO2: 98%    Immunization History  Administered Date(s) Administered   Fluad Quad(high Dose 65+) 08/31/2020, 09/05/2022   Influenza Split 09/05/2011   Influenza Whole 12/10/2003, 08/28/2010   Influenza, Seasonal, Injecte, Preservative Fre 09/10/2023   Influenza,inj,Quad PF,6+ Mos 09/01/2013, 08/31/2014, 09/14/2015, 09/10/2016, 09/09/2017, 09/01/2018, 08/20/2019, 09/07/2021   PFIZER Comirnaty(Gray Top)Covid-19 Tri-Sucrose Vaccine 10/09/2020   PFIZER(Purple Top)SARS-COV-2 Vaccination 02/26/2020, 03/22/2020, 10/09/2020   PNEUMOCOCCAL CONJUGATE-20 11/04/2022   Pneumococcal Polysaccharide-23 09/29/2009, 08/31/2014, 10/27/2019   Td 07/16/2004   Tdap 08/31/2014   Zoster Recombinant(Shingrix) 07/17/2018, 09/12/2018   Zoster, Live 10/05/2014    There are no preventive care reminders to display for this patient.   Mammogram  10/2023-had it last week / has not been read yet  Self breast exam -no lumps   Gyn health Normal pap 2021  No problems or complaints    Colon cancer screening - colonoscopy 04/2018 with 10 y recall    Bone health  Dexa  10/2022  osteopenia  Falls- none  Fractures-none  Supplements  Last vitamin D Lab Results  Component Value Date   VD25OH 53.97 10/28/2023    Exercise :  [facility] a bicep tear-has follow up this week  Still lifting restriction     Mood    11/10/2023   10:00 AM 11/04/2023   10:05 AM 10/28/2023    8:05 AM 07/08/2023    2:09 PM 11/04/2022    9:23 AM  Depression screen PHQ 2/9  Decreased Interest 0 0 1 0 0  Down, Depressed,  Hopeless 0 0 1 0 0  PHQ - 2 Score 0 0 2 0 0  Altered sleeping 0  0 0 0  Tired, decreased energy   1 0 0  Change in appetite 1  1 0 0  Feeling bad or failure about yourself  0  1 0 0  Trouble concentrating 0  0 0 0  Moving slowly or fidgety/restless 0  0 0 0  Suicidal thoughts 0  0 0 0  PHQ-9 Score 1  5 0 0  Difficult doing work/chores Not difficult at all Not difficult at all Not difficult at all Not difficult at all Not difficult at all   History of anx/dep  Takes fluoxetine 40 mg daily  Is an emotional eater- discussed this and ref done for counseling (filled out paperwork and will make an appointment)  Last visit added wellbutrin xl 150 mg daily for mood -no changes  Felt dizzy one day / then fine  Tolerating so far    No longer uses cpap for osa since weight loss    History of migraine  Side of head is still sore- ? Migraine related  Throbs when she coughs or strains or bends over  Has MRI planned 12/17    GERD Omeprazole 20 mg every other day   Lab Results  Component Value Date   VITAMINB12 507 10/28/2023   Takes oral b12 twice weekly    Hyperlipidemia Lab Results  Component  Value Date   CHOL 138 10/28/2023   CHOL 126 10/28/2022   CHOL 113 10/31/2021   Lab Results  Component Value Date   HDL 43.00 10/28/2023   HDL 48.10 10/28/2022   HDL 39 (L) 10/31/2021   Lab Results  Component Value Date   LDLCALC 73 10/28/2023   LDLCALC 66 10/28/2022   LDLCALC 59 10/31/2021   Lab Results  Component Value Date   TRIG 106.0 10/28/2023   TRIG 57.0 10/28/2022   TRIG 73 10/31/2021   Lab Results  Component Value Date   CHOLHDL 3 10/28/2023   CHOLHDL 3 10/28/2022   CHOLHDL 2.9 10/31/2021   Lab Results  Component Value Date   LDLDIRECT 182.2 08/25/2013   LDLDIRECT 183.6 08/07/2012   LDLDIRECT 170.8 04/23/2011   Crestor 10 mg daily   Prediabetes Lab Results  Component Value Date   HGBA1C 5.1 10/28/2023    Tries hard to eat healthy  Sticks to healthy  snacks also  Eats protein bars     Some heel pain for last week  Left foot  Uses gel inserts  Using ice  Has to wear a shoe with a heel to prevent pain      Patient Active Problem List   Diagnosis Date Noted   Disordered eating 10/28/2023   Obesity (BMI 30.0-34.9) 10/28/2023   Head pain 07/08/2023   Biceps muscle tear, left, sequela 07/08/2023   Estrogen deficiency 11/04/2022   Paresthesia of left leg 05/14/2022   Vaso-vagal reaction 03/29/2022   Elevated antinuclear antibody (ANA) level 09/20/2020   Colon cancer screening 03/18/2018   Fatigue 03/18/2018   Prediabetes 03/16/2015   Stress reaction 01/16/2015   Encounter for routine gynecological examination 09/01/2013   Depression with anxiety 01/18/2013   Post-menopausal 08/14/2012   Other screening mammogram 04/29/2011   Routine general medical examination at a health care facility 04/22/2011   Joint pain 02/28/2010   Myofascial pain syndrome 02/28/2010   Vitamin D deficiency 03/08/2009   IRRITABLE BOWEL SYNDROME 06/27/2008   B12 deficiency 02/23/2008   POLYP, COLON 03/06/2007   Hyperlipidemia 03/06/2007   GERD 03/06/2007   Diaphragmatic hernia 03/06/2007   OSA (obstructive sleep apnea) 03/06/2007   URINARY INCONTINENCE 03/06/2007   Migraine 03/06/2007   Past Medical History:  Diagnosis Date   Allergic rhinitis    Allergy    Anxiety    B12 deficiency    Degenerative disc disease    back, knee   Family history of adverse reaction to anesthesia    Fungal infection 2005   lamasil    GERD (gastroesophageal reflux disease)    EGD 10/2004   Hemorrhoids, internal    colonoscopy 02/2008   Hyperlipemia    Iron deficiency anemia    Low back pain    Migraine headache    improved with medications   Obesity    Orthodontics    [employer]   Sleep apnea    no cpap   Urinary incontinence    Vaso-vagal reaction    occured after eye drops for cataract surgery, but pt reports this has happened before during  medical procedures   Vitamin D deficiency    mild   Past Surgical History:  Procedure Laterality Date   CATARACT EXTRACTION W/PHACO Left 03/25/2022   Procedure: CATARACT EXTRACTION PHACO AND INTRAOCULAR LENS PLACEMENT (IOC) LEFT;  Surgeon: Lockie Mola, MD;  Location: [facility];  Service: Ophthalmology;  Laterality: Left;  8.88 1:13.0   CATARACT EXTRACTION W/PHACO Right 04/17/2022  Procedure: CATARACT EXTRACTION PHACO AND INTRAOCULAR LENS PLACEMENT (IOC) RIGHT 7.22 01:22.1;  Surgeon: Lockie Mola, MD;  Location: [facility];  Service: Ophthalmology;  Laterality: Right;  sleep apnea   CESAREAN SECTION  1985   COLONOSCOPY     DILATION AND CURETTAGE OF UTERUS  2005   HAND SURGERY  2000   nodule removed   KNEE SURGERY  1970's   right   NASAL SINUS SURGERY  2000's   right maxillary   TONSILLECTOMY AND ADENOIDECTOMY     TUBAL LIGATION  May 1985   Social History   Tobacco Use   Smoking status: Never   Smokeless tobacco: Never  Vaping Use   Vaping status: Never Used  Substance Use Topics   Alcohol use: Not Currently    Comment: occasional   Drug use: No   Family History  Problem Relation Age of Onset   Coronary artery disease Father        in his 70's   Arthritis Father    Stroke Father    Heart disease Father        pacemaker   Prostate cancer Father    Breast cancer Maternal Grandmother    Cancer Maternal Grandmother    Colon cancer Other    Breast cancer Cousin        mat cousin   Cancer Mother    COPD Mother    Colon polyps Neg Hx    Allergies  Allergen Reactions   Atorvastatin     REACTION: myalgias   Fluogen [Influenza Virus Vaccine]     High dose  Felt sick/achy    Haemophilus B Polysaccharide Vaccine     High dose  Felt sick/achy   Current Outpatient Medications on File Prior to Visit  Medication Sig Dispense Refill   Cholecalciferol (VITAMIN D-3 PO) Take 1 tablet by mouth daily.     Cyanocobalamin (B-12 PO) Take by  mouth daily.     mometasone (ELOCON) 0.1 % cream   0   No current facility-administered medications on file prior to visit.    Review of Systems  Constitutional:  Positive for fatigue. Negative for activity change, appetite change, fever and unexpected weight change.  HENT:  Negative for congestion, ear pain, rhinorrhea, sinus pressure and sore throat.   Eyes:  Negative for pain, redness and visual disturbance.  Respiratory:  Negative for cough, shortness of breath and wheezing.   Cardiovascular:  Negative for chest pain and palpitations.  Gastrointestinal:  Negative for abdominal pain, blood in stool, constipation and diarrhea.  Endocrine: Positive for polyphagia. Negative for polydipsia and polyuria.  Genitourinary:  Negative for dysuria, frequency and urgency.  Musculoskeletal:  Negative for arthralgias, back pain and myalgias.  Skin:  Negative for pallor and rash.  Allergic/Immunologic: Negative for environmental allergies.  Neurological:  Negative for dizziness, syncope and headaches.  Hematological:  Negative for adenopathy. Does not bruise/bleed easily.  Psychiatric/Behavioral:  Negative for decreased concentration and dysphoric mood. The patient is not nervous/anxious.        Some decrease in motivation        Objective:   Physical Exam Constitutional:      General: She is not in acute distress.    Appearance: Normal appearance. She is well-developed. She is obese. She is not ill-appearing or diaphoretic.  HENT:     Head: Normocephalic and atraumatic.     Right Ear: Tympanic membrane, ear canal and external ear normal.     Left Ear: Tympanic membrane,  ear canal and external ear normal.     Nose: Nose normal. No congestion.     Mouth/Throat:     Mouth: Mucous membranes are moist.     Pharynx: Oropharynx is clear. No posterior oropharyngeal erythema.  Eyes:     General: No scleral icterus.    Extraocular Movements: Extraocular movements intact.     Conjunctiva/sclera:  Conjunctivae normal.     Pupils: Pupils are equal, round, and reactive to light.  Neck:     Thyroid: No thyromegaly.     Vascular: No carotid bruit or JVD.  Cardiovascular:     Rate and Rhythm: Normal rate and regular rhythm.     Pulses: Normal pulses.     Heart sounds: Normal heart sounds.     No gallop.  Pulmonary:     Effort: Pulmonary effort is normal. No respiratory distress.     Breath sounds: Normal breath sounds. No wheezing.     Comments: Good air exch Chest:     Chest wall: No tenderness.  Abdominal:     General: Bowel sounds are normal. There is no distension or abdominal bruit.     Palpations: Abdomen is soft. There is no mass.     Tenderness: There is no abdominal tenderness.     Hernia: No hernia is present.  Genitourinary:    Comments: Breast exam: No mass, nodules, thickening, tenderness, bulging, retraction, inflamation, nipple discharge or skin changes noted.  No axillary or clavicular LA.     Musculoskeletal:        General: No tenderness. Normal range of motion.     Cervical back: Normal range of motion and neck supple. No rigidity. No muscular tenderness.     Right lower leg: No edema.     Left lower leg: No edema.     Comments: No kyphosis   Lymphadenopathy:     Cervical: No cervical adenopathy.  Skin:    General: Skin is warm and dry.     Coloration: Skin is not pale.     Findings: No erythema or rash.     Comments: Solar lentigines diffusely   Neurological:     Mental Status: She is alert. Mental status is at baseline.     Cranial Nerves: No cranial nerve deficit.     Motor: No abnormal muscle tone.     Coordination: Coordination normal.     Gait: Gait normal.     Deep Tendon Reflexes: Reflexes are normal and symmetric. Reflexes normal.  Psychiatric:        Mood and Affect: Mood normal.        Cognition and Memory: Cognition and memory normal.           Assessment & Plan:   Problem List Items Addressed This Visit       Cardiovascular  and Mediastinum   Migraine    Unsure if current head pain is migraine related MRI pending   Continues topamax for prophylaxis       Relevant Medications   buPROPion (WELLBUTRIN XL) 150 MG 24 hr tablet   rosuvastatin (CRESTOR) 10 MG tablet   topiramate (TOPAMAX) 50 MG tablet   FLUoxetine (PROZAC) 20 MG capsule     Respiratory   OSA (obstructive sleep apnea)    Not using cpap since weight loss         Digestive   GERD   Relevant Medications   omeprazole (PRILOSEC) 20 MG capsule     Other   Vitamin D  deficiency    Last vitamin D Lab Results  Component Value Date   VD25OH 53.97 10/28/2023   Vitamin D level is therapeutic with current supplementation Disc importance of this to bone and overall health       Routine general medical examination at a health care facility - Primary    Reviewed health habits including diet and exercise and skin cancer prevention Reviewed appropriate screening tests for age  Also reviewed health mt list, fam hx and immunization status , as well as social and family history   See HPI Labs reviewed and ordered Mammogram report pending from last wk  Colonoscopy 2019 with 10 y recall  Dexa  utd  Discussed fall prevention, supplements and exercise for bone density  PHQ 1   Health Maintenance  Topic Date Due   COVID-19 Vaccine (5 - 2023-24 season) 11/12/2025*   DTaP/Tdap/Td vaccine (3 - Td or Tdap) 08/31/2024   Mammogram  11/04/2024   DEXA scan (bone density measurement)  11/07/2024   Medicare Annual Wellness Visit  11/09/2024   Colon Cancer Screening  04/20/2028   Pneumonia Vaccine  Completed   Flu Shot  Completed   Hepatitis C Screening  Completed   Zoster (Shingles) Vaccine  Completed   HPV Vaccine  Aged Out  *Topic was postponed. The date shown is not the original due date.          Prediabetes    Lab Results  Component Value Date   HGBA1C 5.1 10/28/2023   disc imp of low glycemic diet and wt loss to prevent DM2        Obesity (BMI 30.0-34.9)    Discussed how this problem influences overall health and the risks it imposes  Reviewed plan for weight loss with lower calorie diet (via better food choices (lower glycemic and portion control) along with exercise building up to or more than 30 minutes 5 days per week including some aerobic activity and strength training    Pending counseling appointment to discuss mood and emotional eating       Hyperlipidemia    Disc goals for lipids and reasons to control them Rev last labs with pt Rev low sat fat diet in detail LDL 73 Taking crestor 10 mg daily -encouraged to continue it       Relevant Medications   rosuvastatin (CRESTOR) 10 MG tablet   Head pain    MRI planned 12/17  Call back and Er precautions noted in detail today        Relevant Medications   buPROPion (WELLBUTRIN XL) 150 MG 24 hr tablet   topiramate (TOPAMAX) 50 MG tablet   FLUoxetine (PROZAC) 20 MG capsule   Fatigue    Ongoing Suspect multi factorial  Labs reviewed        Depression with anxiety    Continues fluoxetine 40 mg daily  Wellbutrin xl 150 mg daily  Should have a counseling appointment scheduled soon   Encouraged good self care including exercise       Relevant Medications   buPROPion (WELLBUTRIN XL) 150 MG 24 hr tablet   FLUoxetine (PROZAC) 20 MG capsule   Colon cancer screening    Colonoscopy 04/2018 with 10 y recall      B12 deficiency    Lab Results  Component Value Date   VITAMINB12 507 10/28/2023   Continues oral supplement twice weekly

## 2023-11-10 NOTE — Assessment & Plan Note (Signed)
Lab Results  Component Value Date   VITAMINB12 507 10/28/2023   Continues oral supplement twice weekly

## 2023-11-10 NOTE — Assessment & Plan Note (Signed)
Colonoscopy 04/2018 with 10 y recall

## 2023-11-10 NOTE — Patient Instructions (Addendum)
Get the MRI as planned   Take care of yourself   Labs are stable    Gradually exercise more as able   Stop up front to make an appointment with Dr Patsy Lager for heel pain

## 2023-11-10 NOTE — Assessment & Plan Note (Signed)
Lab Results  Component Value Date   HGBA1C 5.1 10/28/2023   disc imp of low glycemic diet and wt loss to prevent DM2

## 2023-11-10 NOTE — Assessment & Plan Note (Signed)
Last vitamin D Lab Results  Component Value Date   VD25OH 53.97 10/28/2023   Vitamin D level is therapeutic with current supplementation Disc importance of this to bone and overall health

## 2023-11-10 NOTE — Assessment & Plan Note (Signed)
Continues fluoxetine 40 mg daily  Wellbutrin xl 150 mg daily  Should have a counseling appointment scheduled soon   Encouraged good self care including exercise

## 2023-11-11 DIAGNOSIS — H40003 Preglaucoma, unspecified, bilateral: Secondary | ICD-10-CM | POA: Diagnosis not present

## 2023-11-13 DIAGNOSIS — Z4789 Encounter for other orthopedic aftercare: Secondary | ICD-10-CM | POA: Diagnosis not present

## 2023-11-17 DIAGNOSIS — H40003 Preglaucoma, unspecified, bilateral: Secondary | ICD-10-CM | POA: Diagnosis not present

## 2023-11-18 NOTE — Progress Notes (Unsigned)
    Pamela Donlon T. Kayliegh Boyers, MD, CAQ Sports Medicine [facility] [address] [place], [address]  Phone: [phone]  FAX: [phone]  Pamela Anthony - 67 y.o. female  MRN [id number]  Date of Birth: [birth year]  Date: 11/19/2023  PCP: [name], MD  Referral: [name], MD  No chief complaint on file.  Subjective:   Pamela Anthony is a 67 y.o. very pleasant female patient with There is no height or weight on file to calculate BMI. who presents with the following:  She is a very pleasant patient who I have seen over the years, most recently 8 years ago, and she presents today with some ongoing heel pain.    Review of Systems is noted in the HPI, as appropriate  Objective:   LMP 04/17/2011   GEN: No acute distress; alert,appropriate. PULM: Breathing comfortably in no respiratory distress PSYCH: Normally interactive.   Laboratory and Imaging Data:  Assessment and Plan:   ***

## 2023-11-19 ENCOUNTER — Ambulatory Visit: Payer: Medicare HMO | Admitting: Family Medicine

## 2023-11-25 ENCOUNTER — Ambulatory Visit
Admission: RE | Admit: 2023-11-25 | Discharge: 2023-11-25 | Disposition: A | Discharge status: Home / Self Care | Payer: Medicare HMO | Source: Ambulatory Visit | Attending: Family Medicine | Admitting: Family Medicine

## 2023-11-25 DIAGNOSIS — G4452 New daily persistent headache (NDPH): Secondary | ICD-10-CM

## 2023-11-25 DIAGNOSIS — R519 Headache, unspecified: Secondary | ICD-10-CM | POA: Diagnosis not present

## 2023-11-25 MED ORDER — GADOPICLENOL 0.5 MMOL/ML IV SOLN
10.0000 mL | Freq: Once | INTRAVENOUS | Status: AC | PRN
  Administered 2023-11-25: 10 mL via INTRAVENOUS

## 2023-12-11 ENCOUNTER — Encounter: Payer: Self-pay | Admitting: Family Medicine

## 2023-12-11 NOTE — Telephone Encounter (Signed)
 Can someone check on status of this reading? Thanks

## 2023-12-11 NOTE — Telephone Encounter (Signed)
 Pamela Anthony will f/u with imaging regarding this

## 2023-12-12 ENCOUNTER — Encounter: Payer: Self-pay | Admitting: Family Medicine

## 2023-12-12 DIAGNOSIS — G4452 New daily persistent headache (NDPH): Secondary | ICD-10-CM

## 2023-12-12 DIAGNOSIS — M25522 Pain in left elbow: Secondary | ICD-10-CM | POA: Diagnosis not present

## 2023-12-12 DIAGNOSIS — M25552 Pain in left hip: Secondary | ICD-10-CM | POA: Insufficient documentation

## 2023-12-12 DIAGNOSIS — Z4789 Encounter for other orthopedic aftercare: Secondary | ICD-10-CM | POA: Diagnosis not present

## 2023-12-12 DIAGNOSIS — M7062 Trochanteric bursitis, left hip: Secondary | ICD-10-CM | POA: Diagnosis not present

## 2023-12-25 DIAGNOSIS — M25522 Pain in left elbow: Secondary | ICD-10-CM | POA: Diagnosis not present

## 2023-12-25 DIAGNOSIS — S40012A Contusion of left shoulder, initial encounter: Secondary | ICD-10-CM | POA: Diagnosis not present

## 2023-12-26 DIAGNOSIS — S40012A Contusion of left shoulder, initial encounter: Secondary | ICD-10-CM | POA: Insufficient documentation

## 2024-01-27 ENCOUNTER — Encounter: Payer: Self-pay | Admitting: Family Medicine

## 2024-01-27 DIAGNOSIS — R7303 Prediabetes: Secondary | ICD-10-CM

## 2024-01-27 DIAGNOSIS — E66811 Obesity, class 1: Secondary | ICD-10-CM

## 2024-01-28 MED ORDER — TIRZEPATIDE-WEIGHT MANAGEMENT 2.5 MG/0.5ML ~~LOC~~ SOAJ: 2.5000 mg | SUBCUTANEOUS | 0 refills | Status: AC

## 2024-02-06 ENCOUNTER — Other Ambulatory Visit (HOSPITAL_COMMUNITY): Payer: Self-pay

## 2024-02-06 ENCOUNTER — Telehealth: Payer: Self-pay

## 2024-02-06 NOTE — Telephone Encounter (Signed)
 PA request has been Approved. Unfortunately, the test claim still shows a high cost of $581.69  New Encounter created for follow up. For additional info see Pharmacy Prior Auth telephone encounter from 02/06/2024.

## 2024-02-06 NOTE — Telephone Encounter (Signed)
 Pharmacy Patient Advocate Encounter   Received notification from Patient Advice Request messages that prior authorization for Zepbound 2.5MG /0.5ML pen-injectors is required/requested.   Insurance verification completed.   The patient is insured through [employer] .   Per test claim: PA required and submitted KEY/EOC/Request #: [id number] APPROVED from 12/10/2023 to 12/08/2024. Spoke to pharmacy to process.Copay is $581.69.

## 2024-02-08 NOTE — Telephone Encounter (Signed)
 Please let pt know   I would like to refer her to [facility] if she is open to that There is generally a long wait but I think it may be worth it

## 2024-02-09 ENCOUNTER — Ambulatory Visit
Admission: EM | Admit: 2024-02-09 | Discharge: 2024-02-09 | Disposition: A | Discharge status: Home / Self Care | Attending: Emergency Medicine | Admitting: Emergency Medicine

## 2024-02-09 ENCOUNTER — Encounter: Payer: Self-pay | Admitting: Emergency Medicine

## 2024-02-09 DIAGNOSIS — J069 Acute upper respiratory infection, unspecified: Secondary | ICD-10-CM | POA: Diagnosis not present

## 2024-02-09 LAB — POC COVID19/FLU A&B COMBO
Covid Antigen, POC: NEGATIVE
Influenza A Antigen, POC: NEGATIVE
Influenza B Antigen, POC: NEGATIVE

## 2024-02-09 MED ORDER — PREDNISONE 10 MG (21) PO TBPK: ORAL_TABLET | Freq: Every day | ORAL | 0 refills | Status: DC

## 2024-02-09 MED ORDER — AZITHROMYCIN 250 MG PO TABS: 250.0000 mg | ORAL_TABLET | Freq: Every day | ORAL | 0 refills | Status: DC

## 2024-02-09 NOTE — ED Provider Notes (Signed)
 Renaldo Fiddler    CSN: [id number] Arrival date & time: 02/09/24  1637      History   Chief Complaint Chief Complaint  Patient presents with   Cough   Nasal Congestion    HPI Inioluwa Boulay is a 68 y.o. female.  Patient presents with scratchy throat x 5 days.  She reports congestion, postnasal drip, runny nose, nonproductive cough x 3 days.  No fever, chills, shortness of breath.  She has been treating her symptoms with Mucinex and Sudafed.  Patient reports history of similar symptoms with bronchitis in the past; she states she has needed prednisone and an antibiotic to treat this.  The history is provided by the patient and medical records.    Past Medical History:  Diagnosis Date   Allergic rhinitis    Allergy    Anxiety    B12 deficiency    Degenerative disc disease    back, knee   Family history of adverse reaction to anesthesia    Fungal infection 2005   lamasil    GERD (gastroesophageal reflux disease)    EGD 10/2004   Hemorrhoids, internal    colonoscopy 02/2008   Hyperlipemia    Iron deficiency anemia    Low back pain    Migraine headache    improved with medications   Obesity    Orthodontics    In[facility]   Sleep apnea    no cpap   Urinary incontinence    Vaso-vagal reaction    occured after eye drops for cataract surgery, but pt reports this has happened before during medical procedures   Vitamin D deficiency    mild    Patient Active Problem List   Diagnosis Date Noted   Disordered eating 10/28/2023   Obesity (BMI 30.0-34.9) 10/28/2023   Head pain 07/08/2023   Biceps muscle tear, left, sequela 07/08/2023   Estrogen deficiency 11/04/2022   Paresthesia of left leg 05/14/2022   Vaso-vagal reaction 03/29/2022   Elevated antinuclear antibody (ANA) level 09/20/2020   Colon cancer screening 03/18/2018   Fatigue 03/18/2018   Prediabetes 03/16/2015   Stress reaction 01/16/2015   Encounter for routine gynecological examination 09/01/2013    Depression with anxiety 01/18/2013   Post-menopausal 08/14/2012   Other screening mammogram 04/29/2011   Routine general medical examination at a health care facility 04/22/2011   Joint pain 02/28/2010   Myofascial pain syndrome 02/28/2010   Vitamin D deficiency 03/08/2009   IRRITABLE BOWEL SYNDROME 06/27/2008   B12 deficiency 02/23/2008   POLYP, COLON 03/06/2007   Hyperlipidemia 03/06/2007   GERD 03/06/2007   Diaphragmatic hernia 03/06/2007   OSA (obstructive sleep apnea) 03/06/2007   URINARY INCONTINENCE 03/06/2007   Migraine 03/06/2007    Past Surgical History:  Procedure Laterality Date   CATARACT EXTRACTION W/PHACO Left 03/25/2022   Procedure: CATARACT EXTRACTION PHACO AND INTRAOCULAR LENS PLACEMENT (IOC) LEFT;  Surgeon: Lockie Mola, MD;  Location: [facility];  Service: Ophthalmology;  Laterality: Left;  8.88 1:13.0   CATARACT EXTRACTION W/PHACO Right 04/17/2022   Procedure: CATARACT EXTRACTION PHACO AND INTRAOCULAR LENS PLACEMENT (IOC) RIGHT 7.22 01:22.1;  Surgeon: Lockie Mola, MD;  Location: [facility];  Service: Ophthalmology;  Laterality: Right;  sleep apnea   CESAREAN SECTION  1985   COLONOSCOPY     DILATION AND CURETTAGE OF UTERUS  2005   HAND SURGERY  2000   nodule removed   KNEE SURGERY  1970's   right   NASAL SINUS SURGERY  2000's   right  maxillary   TONSILLECTOMY AND ADENOIDECTOMY     TUBAL LIGATION  May 1985    OB History   No obstetric history on file.      Home Medications    Prior to Admission medications   Medication Sig Start Date End Date Taking? Authorizing Provider  azithromycin (ZITHROMAX) 250 MG tablet Take 1 tablet (250 mg total) by mouth daily. Take first 2 tablets together, then 1 every day until finished. 02/09/24  Yes Mickie Bail, NP  buPROPion (WELLBUTRIN XL) 150 MG 24 hr tablet Take 1 tablet (150 mg total) by mouth daily. 11/10/23  Yes Tower, Audrie Gallus, MD  Cholecalciferol (VITAMIN D-3 PO) Take 1  tablet by mouth daily.   Yes [provider]  Cyanocobalamin (B-12 PO) Take by mouth daily.   Yes [provider]  FLUoxetine (PROZAC) 20 MG capsule Take 2 capsules (40 mg total) by mouth daily. 11/10/23  Yes Tower, Audrie Gallus, MD  fluticasone (FLONASE) 50 MCG/ACT nasal spray Place 2 sprays into both nostrils daily. 11/10/23  Yes Tower, Audrie Gallus, MD  omeprazole (PRILOSEC) 20 MG capsule Take 1 capsule (20 mg total) by mouth daily. 11/10/23  Yes Tower, Audrie Gallus, MD  oxybutynin (DITROPAN-XL) 10 MG 24 hr tablet Take 1 tablet (10 mg total) by mouth at bedtime. 11/10/23  Yes Tower, Audrie Gallus, MD  predniSONE (STERAPRED UNI-PAK 21 TAB) 10 MG (21) TBPK tablet Take by mouth daily. As directed 02/09/24  Yes Mickie Bail, NP  rosuvastatin (CRESTOR) 10 MG tablet Take 1 tablet (10 mg total) by mouth daily. 11/10/23  Yes Tower, Audrie Gallus, MD  topiramate (TOPAMAX) 50 MG tablet Take 1 tablet (50 mg total) by mouth at bedtime. 11/10/23  Yes Tower, Audrie Gallus, MD  mometasone (ELOCON) 0.1 % cream  03/11/18   [provider]  tirzepatide (ZEPBOUND) 2.5 MG/0.5ML Pen Inject 2.5 mg into the skin once a week. 01/28/24 02/27/24  Tower, Audrie Gallus, MD    Family History Family History  Problem Relation Age of Onset   Coronary artery disease Father        in his 55's   Arthritis Father    Stroke Father    Heart disease Father        pacemaker   Prostate cancer Father    Breast cancer Maternal Grandmother    Cancer Maternal Grandmother    Colon cancer Other    Breast cancer Cousin        mat cousin   Cancer Mother    COPD Mother    Colon polyps Neg Hx     Social History Social History   Tobacco Use   Smoking status: Never   Smokeless tobacco: Never  Vaping Use   Vaping status: Never Used  Substance Use Topics   Alcohol use: Not Currently    Comment: occasional   Drug use: No     Allergies   Atorvastatin, Fluogen [influenza virus vaccine], and Haemophilus b polysaccharide vaccine   Review of  Systems Review of Systems  Constitutional:  Negative for chills and fever.  HENT:  Positive for congestion, postnasal drip, rhinorrhea and sore throat. Negative for ear pain.   Respiratory:  Positive for cough. Negative for shortness of breath.      Physical Exam Triage Vital Signs ED Triage Vitals [02/09/24 1703]  Encounter Vitals Group     BP 139/84     Systolic BP Percentile      Diastolic BP Percentile  Pulse Rate 91     Resp 18     Temp 98.9 F (37.2 C)     Temp src      SpO2 97 %     Weight      Height      Head Circumference      Peak Flow      Pain Score      Pain Loc      Pain Education      Exclude from Growth Chart    No data found.  Updated Vital Signs BP 139/84   Pulse 91   Temp 98.9 F (37.2 C)   Resp 18   LMP 04/17/2011   SpO2 97%   Visual Acuity Right Eye Distance:   Left Eye Distance:   Bilateral Distance:    Right Eye Near:   Left Eye Near:    Bilateral Near:     Physical Exam Constitutional:      General: She is not in acute distress. HENT:     Right Ear: Tympanic membrane normal.     Left Ear: Tympanic membrane normal.     Nose: Congestion and rhinorrhea present.     Mouth/Throat:     Mouth: Mucous membranes are moist.     Pharynx: Oropharynx is clear.  Cardiovascular:     Rate and Rhythm: Normal rate and regular rhythm.     Heart sounds: Normal heart sounds.  Pulmonary:     Effort: Pulmonary effort is normal. No respiratory distress.     Breath sounds: Normal breath sounds.  Neurological:     Mental Status: She is alert.      UC Treatments / Results  Labs (all labs ordered are listed, but only abnormal results are displayed) Labs Reviewed  POC COVID19/FLU A&B COMBO - Normal    EKG   Radiology No results found.  Procedures Procedures (including critical care time)  Medications Ordered in UC Medications - No data to display  Initial Impression / Assessment and Plan / UC Course  I have reviewed the  triage vital signs and the nursing notes.  Pertinent labs & imaging results that were available during my care of the patient were reviewed by me and considered in my medical decision making (see chart for details).    Acute upper respiratory infection.  Lungs are clear and O2 sat is 97% on room air.  Treating with prednisone.  Zithromax sent to pharmacy and instructed patient to start this if her symptoms are not improving in the next 2 to 3 days.  Instructed her to follow-up with her PCP if she is not improving with treatment.  Education provided on upper respiratory infection.  She agrees to plan of care.  Final Clinical Impressions(s) / UC Diagnoses   Final diagnoses:  Acute upper respiratory infection     Discharge Instructions      Take the Zithromax and prednisone as directed.  Follow-up with your primary care provider if your symptoms are not improving.      ED Prescriptions     Medication Sig Dispense Auth. Provider   predniSONE (STERAPRED UNI-PAK 21 TAB) 10 MG (21) TBPK tablet Take by mouth daily. As directed 21 tablet Mickie Bail, NP   azithromycin (ZITHROMAX) 250 MG tablet Take 1 tablet (250 mg total) by mouth daily. Take first 2 tablets together, then 1 every day until finished. 6 tablet Mickie Bail, NP      PDMP not reviewed this encounter.  Mickie Bail, NP 02/09/24 [phone]

## 2024-02-09 NOTE — ED Triage Notes (Signed)
 Pt presents with a non-productive cough x 2 days. Her symptoms started 5 days ago with a scratchy throat.

## 2024-02-09 NOTE — Discharge Instructions (Addendum)
Take the Zithromax and prednisone as directed.  Follow up with your primary care provider if your symptoms are not improving.

## 2024-02-16 NOTE — Telephone Encounter (Signed)
 I put the referral in to the [facility]   Would you please give her the # to check in re: how long a wait there will be? Thanks

## 2024-02-16 NOTE — Addendum Note (Signed)
 Addended by: Roxy Manns A on: 02/16/2024 08:07 PM   Modules accepted: Orders

## 2024-02-18 ENCOUNTER — Encounter (INDEPENDENT_AMBULATORY_CARE_PROVIDER_SITE_OTHER): Payer: Self-pay

## 2024-02-19 NOTE — Telephone Encounter (Signed)
 Appt has been scheduled.

## 2024-02-23 ENCOUNTER — Ambulatory Visit (INDEPENDENT_AMBULATORY_CARE_PROVIDER_SITE_OTHER): Payer: Self-pay | Admitting: Adult Health

## 2024-02-23 ENCOUNTER — Encounter (INDEPENDENT_AMBULATORY_CARE_PROVIDER_SITE_OTHER): Payer: Self-pay | Admitting: Adult Health

## 2024-02-23 VITALS — BP 117/78 | HR 92 | Temp 98.4°F | Ht 66.0 in | Wt 203.0 lb

## 2024-02-23 DIAGNOSIS — E7800 Pure hypercholesterolemia, unspecified: Secondary | ICD-10-CM | POA: Diagnosis not present

## 2024-02-23 DIAGNOSIS — R7303 Prediabetes: Secondary | ICD-10-CM | POA: Diagnosis not present

## 2024-02-23 DIAGNOSIS — Z0289 Encounter for other administrative examinations: Secondary | ICD-10-CM

## 2024-02-23 DIAGNOSIS — E669 Obesity, unspecified: Secondary | ICD-10-CM

## 2024-02-23 DIAGNOSIS — E559 Vitamin D deficiency, unspecified: Secondary | ICD-10-CM

## 2024-02-23 DIAGNOSIS — E66811 Obesity, class 1: Secondary | ICD-10-CM

## 2024-02-23 NOTE — Progress Notes (Signed)
 Office: [phone]  /  Fax: [phone]   Initial Visit  Pamela Anthony was seen in clinic today to evaluate for obesity. She is interested in losing weight to improve overall health and reduce the risk of weight related complications. She presents today to review program treatment options, initial physical assessment, and evaluation.     She was referred by: PCP  When asked what else they would like to accomplish? She states: Adopt healthier eating patterns, Improve energy levels and physical activity, Improve existing medical conditions, Reduce number of medications, Improve quality of life, Improve appearance, and Current Weight 203 lbs, goal weight 165-170 lbs  When asked how has your weight affected you? She states: Contributed to medical problems, Having fatigue, Having poor endurance, and Problems with eating patterns  Weight history: Due to family members in health decline and her care taking responsibilities- she reports steady weight gain since 2017/2018  Some associated conditions: Hypertension, Hyperlipidemia, Prediabetes, and Vitamin D Deficiency  Contributing factors: Consumption of processed foods, Moderate to high levels of stress, Reduced physical activity, and Eating patterns  Weight promoting medications identified: Psychotropic medications  Current nutrition plan: Low-carb, High-protein, and Vegetarian  Current level of physical activity: Walking 15 minutes and NEAT  Current or previous pharmacotherapy: Other: Weight Loss Gtts from [facility]- used Oct 2022-April 2023  Response to medication:  Gtts from [facility], after she stopped gtt's- she steadily regained weight   Past medical history includes:   Past Medical History:  Diagnosis Date   Allergic rhinitis    Allergy    Anxiety    B12 deficiency    Degenerative disc disease    back, knee   Family history of adverse reaction to anesthesia    Fungal infection 2005   lamasil     GERD (gastroesophageal reflux disease)    EGD 10/2004   Hemorrhoids, internal    colonoscopy 02/2008   Hyperlipemia    Iron deficiency anemia    Low back pain    Migraine headache    improved with medications   Obesity    Orthodontics    In[facility]   Sleep apnea    no cpap   Urinary incontinence    Vaso-vagal reaction    occured after eye drops for cataract surgery, but pt reports this has happened before during medical procedures   Vitamin D deficiency    mild     Objective:   BP 117/78   Pulse 92   Temp 98.4 F (36.9 C)   Ht 5\' 6"  (1.676 m)   Wt 203 lb (92.1 kg)   LMP 04/17/2011   SpO2 100%   BMI 32.77 kg/m  She was weighed on the bioimpedance scale: Body mass index is 32.77 kg/m.  Peak Weight:220 , Body Fat%:45, Visceral Fat Rating:13, Weight trend over the last 12 months: Increasing  General:  Alert, oriented and cooperative. Patient is in no acute distress.  Respiratory: Normal respiratory effort, no problems with respiration noted   Gait: able to ambulate independently  Mental Status: Normal mood and affect. Normal behavior. Normal judgment and thought content.   DIAGNOSTIC DATA REVIEWED:  BMET    Component Value Date/Time   NA 144 10/28/2023 0853   K 3.6 10/28/2023 0853   CL 104 10/28/2023 0853   CO2 31 10/28/2023 0853   GLUCOSE 96 10/28/2023 0853   BUN 22 10/28/2023 0853   CREATININE 0.69 10/28/2023 0853   CREATININE 0.63 10/31/2021 1452   CALCIUM 9.1 10/28/2023  6962   GFRNONAA 93.04 02/28/2010 1214   GFRAA 113 06/21/2008 1405   Lab Results  Component Value Date   HGBA1C 5.1 10/28/2023   HGBA1C 5.5 09/14/2015   No results found for: "INSULIN" CBC    Component Value Date/Time   WBC 4.6 10/28/2023 0853   RBC 4.33 10/28/2023 0853   HGB 13.2 10/28/2023 0853   HCT 39.5 10/28/2023 0853   PLT 232.0 10/28/2023 0853   MCV 91.3 10/28/2023 0853   MCH 29.3 10/31/2021 1452   MCHC 33.5 10/28/2023 0853   RDW 13.1 10/28/2023 0853    Iron/TIBC/Ferritin/ %Sat    Component Value Date/Time   IRON 72 09/05/2022 1147   FERRITIN 4.4 (L) 09/05/2008 1025   IRONPCTSAT 8.4 (L) 06/21/2008 1405   Lipid Panel     Component Value Date/Time   CHOL 138 10/28/2023 0853   TRIG 106.0 10/28/2023 0853   HDL 43.00 10/28/2023 0853   CHOLHDL 3 10/28/2023 0853   VLDL 21.2 10/28/2023 0853   LDLCALC 73 10/28/2023 0853   LDLCALC 59 10/31/2021 1452   LDLDIRECT 182.2 08/25/2013 0814   Hepatic Function Panel     Component Value Date/Time   PROT 6.6 10/28/2023 0853   ALBUMIN 4.5 10/28/2023 0853   AST 15 10/28/2023 0853   ALT 12 10/28/2023 0853   ALKPHOS 65 10/28/2023 0853   BILITOT 0.6 10/28/2023 0853   BILIDIR 0.0 02/28/2010 1214      Component Value Date/Time   TSH 2.70 10/28/2023 0853     Assessment and Plan:   Prediabetes  Pure hypercholesterolemia  Vitamin D deficiency  Obesity (BMI 30.0-34.9), Starting BMI 32.8  ESTABLISH WITH HWW   Obesity Treatment / Action Plan:  Patient will work on garnering support from family and friends to begin weight loss journey. Will work on eliminating or reducing the presence of highly palatable, calorie dense foods in the home. Will complete provided nutritional and psychosocial assessment questionnaire before the next appointment. Will be scheduled for indirect calorimetry to determine resting energy expenditure in a fasting state.  This will allow [place] to create a reduced calorie, high-protein meal plan to promote loss of fat mass while preserving muscle mass. Counseled on the health benefits of losing 5%-15% of total body weight. Was counseled on nutritional approaches to weight loss and benefits of reducing processed foods and consuming plant-based foods and high quality protein as part of nutritional weight management. Was counseled on pharmacotherapy and role as an adjunct in weight management.   Obesity Education Performed Today:  She was weighed on the bioimpedance scale  and results were discussed and documented in the synopsis.  We discussed obesity as a disease and the importance of a more detailed evaluation of all the factors contributing to the disease.  We discussed the importance of long term lifestyle changes which include nutrition, exercise and behavioral modifications as well as the importance of customizing this to her specific health and social needs.  We discussed the benefits of reaching a healthier weight to alleviate the symptoms of existing conditions and reduce the risks of the biomechanical, metabolic and psychological effects of obesity.  Jearlean Christiana appears to be in the action stage of change and states they are ready to start intensive lifestyle modifications and behavioral modifications.  30 minutes was spent today on this visit including the above counseling, pre-visit chart review, and post-visit documentation.  Reviewed by clinician on day of visit: allergies, medications, problem list, medical history, surgical history, family history, social history,  and previous encounter notes pertinent to obesity diagnosis.  Kenry Daubert d. Lilac Hoff, NP-C

## 2024-03-03 ENCOUNTER — Encounter: Payer: Self-pay | Admitting: Family Medicine

## 2024-03-03 ENCOUNTER — Ambulatory Visit (INDEPENDENT_AMBULATORY_CARE_PROVIDER_SITE_OTHER)
Admission: RE | Admit: 2024-03-03 | Discharge: 2024-03-03 | Disposition: A | Discharge status: Home / Self Care | Source: Ambulatory Visit | Attending: Family Medicine | Admitting: Family Medicine

## 2024-03-03 ENCOUNTER — Ambulatory Visit (INDEPENDENT_AMBULATORY_CARE_PROVIDER_SITE_OTHER): Admitting: Family Medicine

## 2024-03-03 VITALS — BP 116/72 | HR 65 | Temp 98.6°F | Ht 66.0 in | Wt 211.0 lb

## 2024-03-03 DIAGNOSIS — J301 Allergic rhinitis due to pollen: Secondary | ICD-10-CM | POA: Diagnosis not present

## 2024-03-03 DIAGNOSIS — J209 Acute bronchitis, unspecified: Secondary | ICD-10-CM

## 2024-03-03 DIAGNOSIS — R058 Other specified cough: Secondary | ICD-10-CM | POA: Insufficient documentation

## 2024-03-03 DIAGNOSIS — R059 Cough, unspecified: Secondary | ICD-10-CM | POA: Diagnosis not present

## 2024-03-03 MED ORDER — HYDROCOD POLI-CHLORPHE POLI ER 10-8 MG/5ML PO SUER: 5.0000 mL | Freq: Two times a day (BID) | ORAL | 0 refills | Status: DC | PRN

## 2024-03-03 MED ORDER — BENZONATATE 200 MG PO CAPS: 200.0000 mg | ORAL_CAPSULE | Freq: Three times a day (TID) | ORAL | 1 refills | Status: DC | PRN

## 2024-03-03 MED ORDER — PREDNISONE 10 MG PO TABS: ORAL_TABLET | ORAL | 0 refills | Status: DC

## 2024-03-03 NOTE — Progress Notes (Signed)
 Subjective:    Patient ID: Pamela Anthony, female    DOB: 18-Oct-1956, 68 y.o.   MRN: [id number]  HPI  Wt Readings from Last 3 Encounters:  03/03/24 211 lb (95.7 kg)  02/23/24 203 lb (92.1 kg)  11/10/23 198 lb 6 oz (90 kg)   34.06 kg/m  Vitals:   03/03/24 1030  BP: 116/72  Pulse: 65  Temp: 98.6 F (37 C)  SpO2: 98%    Pt presents for c/o cough Dry and painful For 3 weeks    Was seen at [facility] in [place] on 3/3 for uri symptoms  Per note: Acute upper respiratory infection.  Lungs are clear and O2 sat is 97% on room air.  Treating with prednisone.  Zithromax sent to pharmacy and instructed patient to start this if her symptoms are not improving in the next 2 to 3 days.  Instructed her to follow-up with her PCP if she is not improving with treatment.  Education provided on upper respiratory infection.  She agrees to plan of care. Covid and flu tests were negative   Feels better except for cough   Had some nasal mucous- that has stopped  Feels rattle in chest but cannot get mucous out  Hurts to cough  Cannot catch her breath during coughing spells   Not wheezing  A little shortness of breath on exertion  No fever   Mucous is a bit yellow   Over the counter Zyrtec  Flonase  Tried mucinex - plain /did not help  Sudafed- stopped it     Cxr today DG Chest 2 View Result Date: 03/03/2024 CLINICAL DATA:  Cough EXAM: CHEST - 2 VIEW COMPARISON:  05/09/2021 FINDINGS: The heart size and mediastinal contours are within normal limits. Both lungs are clear. The visualized skeletal structures are unremarkable. IMPRESSION: Normal study. Electronically Signed   By: Charlett Nose M.D.   On: 03/03/2024 11:27     Patient Active Problem List   Diagnosis Date Noted   Post-viral cough syndrome 03/03/2024   Disordered eating 10/28/2023   Obesity (BMI 30.0-34.9) 10/28/2023   Head pain 07/08/2023   Biceps muscle tear, left, sequela 07/08/2023   Estrogen deficiency 11/04/2022    Paresthesia of left leg 05/14/2022   Vaso-vagal reaction 03/29/2022   Elevated antinuclear antibody (ANA) level 09/20/2020   Colon cancer screening 03/18/2018   Fatigue 03/18/2018   Prediabetes 03/16/2015   Stress reaction 01/16/2015   Acute bronchitis 04/04/2014   Encounter for routine gynecological examination 09/01/2013   Depression with anxiety 01/18/2013   Post-menopausal 08/14/2012   Other screening mammogram 04/29/2011   Routine general medical examination at a health care facility 04/22/2011   Joint pain 02/28/2010   Myofascial pain syndrome 02/28/2010   Vitamin D deficiency 03/08/2009   IRRITABLE BOWEL SYNDROME 06/27/2008   B12 deficiency 02/23/2008   POLYP, COLON 03/06/2007   Hyperlipidemia 03/06/2007   Allergic rhinitis 03/06/2007   GERD 03/06/2007   Diaphragmatic hernia 03/06/2007   OSA (obstructive sleep apnea) 03/06/2007   URINARY INCONTINENCE 03/06/2007   Migraine 03/06/2007   Past Medical History:  Diagnosis Date   Allergic rhinitis    Allergy    Anxiety    B12 deficiency    Degenerative disc disease    back, knee   Family history of adverse reaction to anesthesia    Fungal infection 2005   lamasil    GERD (gastroesophageal reflux disease)    EGD 10/2004   Hemorrhoids, internal    colonoscopy 02/2008  Hyperlipemia    Iron deficiency anemia    Low back pain    Migraine headache    improved with medications   Obesity    Orthodontics    [employer]   Sleep apnea    no cpap   Urinary incontinence    Vaso-vagal reaction    occured after eye drops for cataract surgery, but pt reports this has happened before during medical procedures   Vitamin D deficiency    mild   Past Surgical History:  Procedure Laterality Date   CATARACT EXTRACTION W/PHACO Left 03/25/2022   Procedure: CATARACT EXTRACTION PHACO AND INTRAOCULAR LENS PLACEMENT (IOC) LEFT;  Surgeon: Lockie Mola, MD;  Location: [facility];  Service: Ophthalmology;  Laterality:  Left;  8.88 1:13.0   CATARACT EXTRACTION W/PHACO Right 04/17/2022   Procedure: CATARACT EXTRACTION PHACO AND INTRAOCULAR LENS PLACEMENT (IOC) RIGHT 7.22 01:22.1;  Surgeon: Lockie Mola, MD;  Location: [facility];  Service: Ophthalmology;  Laterality: Right;  sleep apnea   CESAREAN SECTION  1985   COLONOSCOPY     DILATION AND CURETTAGE OF UTERUS  2005   HAND SURGERY  2000   nodule removed   KNEE SURGERY  1970's   right   NASAL SINUS SURGERY  2000's   right maxillary   TONSILLECTOMY AND ADENOIDECTOMY     TUBAL LIGATION  May 1985   Social History   Tobacco Use   Smoking status: Never   Smokeless tobacco: Never  Vaping Use   Vaping status: Never Used  Substance Use Topics   Alcohol use: Not Currently    Comment: occasional   Drug use: No   Family History  Problem Relation Age of Onset   Coronary artery disease Father        in his 52's   Arthritis Father    Stroke Father    Heart disease Father        pacemaker   Prostate cancer Father    Breast cancer Maternal Grandmother    Cancer Maternal Grandmother    Colon cancer Other    Breast cancer Cousin        mat cousin   Cancer Mother    COPD Mother    Colon polyps Neg Hx    Allergies  Allergen Reactions   Atorvastatin     REACTION: myalgias   Fluogen [Influenza Virus Vaccine]     High dose  Felt sick/achy    Haemophilus B Polysaccharide Vaccine     High dose  Felt sick/achy   Current Outpatient Medications on File Prior to Visit  Medication Sig Dispense Refill   Ascorbic Acid (VITAMIN C) 1000 MG tablet Take 1,000 mg by mouth daily.     cetirizine (ZYRTEC) 10 MG tablet Take 10 mg by mouth at bedtime.     Cholecalciferol (VITAMIN D-3 PO) Take 1 tablet by mouth daily.     Cyanocobalamin (B-12 PO) Take by mouth daily.     Flaxseed, Linseed, (FLAXSEED OIL) 1000 MG CAPS Take 1,000 mg by mouth daily at 12 noon.     FLUoxetine (PROZAC) 20 MG capsule Take 2 capsules (40 mg total) by mouth daily. 180  capsule 3   fluticasone (FLONASE) 50 MCG/ACT nasal spray Place 2 sprays into both nostrils daily. 48 g 3   GLUCOMANNAN PO Take 600 mg by mouth 3 (three) times daily.     mometasone (ELOCON) 0.1 % cream   0   omeprazole (PRILOSEC) 20 MG capsule Take 1 capsule (20 mg  total) by mouth daily. 90 capsule 1   oxybutynin (DITROPAN-XL) 10 MG 24 hr tablet Take 1 tablet (10 mg total) by mouth at bedtime. 90 tablet 3   rosuvastatin (CRESTOR) 10 MG tablet Take 1 tablet (10 mg total) by mouth daily. 90 tablet 3   topiramate (TOPAMAX) 50 MG tablet Take 1 tablet (50 mg total) by mouth at bedtime. 90 tablet 3   Multiple Vitamins-Minerals (ANTIOXIDANT PO) Take by mouth daily at 12 noon. Dosage unknow (Patient not taking: Reported on 03/03/2024)     No current facility-administered medications on file prior to visit.    Review of Systems  Constitutional:  Positive for fatigue. Negative for activity change, appetite change, fever and unexpected weight change.  HENT:  Negative for congestion, ear pain, rhinorrhea, sinus pressure and sore throat.   Eyes:  Negative for pain, redness and visual disturbance.  Respiratory:  Positive for cough, chest tightness and shortness of breath. Negative for wheezing and stridor.   Cardiovascular:  Negative for chest pain and palpitations.  Gastrointestinal:  Negative for abdominal pain, blood in stool, constipation and diarrhea.  Endocrine: Negative for polydipsia and polyuria.  Genitourinary:  Negative for dysuria, frequency and urgency.  Musculoskeletal:  Negative for arthralgias, back pain and myalgias.  Skin:  Negative for pallor and rash.  Allergic/Immunologic: Negative for environmental allergies.  Neurological:  Negative for dizziness, syncope and headaches.  Hematological:  Negative for adenopathy. Does not bruise/bleed easily.  Psychiatric/Behavioral:  Negative for decreased concentration and dysphoric mood. The patient is not nervous/anxious.        Objective:    Physical Exam Constitutional:      General: She is not in acute distress.    Appearance: Normal appearance. She is well-developed. She is obese. She is not ill-appearing or diaphoretic.  HENT:     Head: Normocephalic and atraumatic.     Right Ear: Tympanic membrane and ear canal normal.     Left Ear: Tympanic membrane and ear canal normal.     Ears:     Comments: Hearing aides     Nose:     Comments: Boggy nares     Mouth/Throat:     Mouth: Mucous membranes are moist.     Pharynx: Oropharynx is clear. No oropharyngeal exudate or posterior oropharyngeal erythema.  Eyes:     Conjunctiva/sclera: Conjunctivae normal.     Pupils: Pupils are equal, round, and reactive to light.  Neck:     Thyroid: No thyromegaly.     Vascular: No carotid bruit or JVD.  Cardiovascular:     Rate and Rhythm: Normal rate and regular rhythm.     Heart sounds: Normal heart sounds.     No gallop.  Pulmonary:     Effort: Pulmonary effort is normal. No respiratory distress.     Breath sounds: No stridor. Rhonchi present. No wheezing or rales.     Comments: Scattered rhonchi Upper airway sounds cleared by cough   No prolonged exp phase  Chest:     Chest wall: No tenderness.  Abdominal:     General: There is no distension or abdominal bruit.     Palpations: Abdomen is soft.  Musculoskeletal:     Cervical back: Normal range of motion and neck supple.     Right lower leg: No edema.     Left lower leg: No edema.  Lymphadenopathy:     Cervical: No cervical adenopathy.  Skin:    General: Skin is warm and dry.  Coloration: Skin is not pale.     Findings: No rash.  Neurological:     Mental Status: She is alert.     Coordination: Coordination normal.     Deep Tendon Reflexes: Reflexes are normal and symmetric. Reflexes normal.  Psychiatric:        Mood and Affect: Mood normal.           Assessment & Plan:   Problem List Items Addressed This Visit       Respiratory   Post-viral cough  syndrome - Primary   In setting of post viral bronchitis and allergic rhinitis  Reviewed UC notes/eval/results 3/3   Will try to stop cough cycle with tussionex and tessalon  Prednisone for bronchitis Cxr today   Update if not starting to improve in a week or if worsening  Call back and Er precautions noted in detail today        Relevant Orders   DG Chest 2 View   Allergic rhinitis   No doubt adding to cyclic cough   Plan to continue Flonase daily  Zyrtec 10 mg daily   Allergen avoidance when able      Acute bronchitis   With post viral cough syndrome  Reviewed note/eval and plan from UC on 3/3   Now rhonchi on exam  Continuous cough   Cxr ordered   Prescription prednisone 30 mg taper, tessalon and tussionex with caution of sedation   Update if not starting to improve in a week or if worsening  Call back and Er precautions noted in detail today        Relevant Orders   DG Chest 2 View

## 2024-03-03 NOTE — Patient Instructions (Signed)
 Continue fluids  Try tessalon pearles three times daily  Tussionex as needed with caution of sedation  Prednisone for bronchitis / to calm down lungs   Chest xray now  We will reach out with results and plan    If worse or short of breath -go to the ER  Update if not starting to improve in a week or if worsening   Continue allergy medicines

## 2024-03-03 NOTE — Assessment & Plan Note (Addendum)
 In setting of post viral bronchitis and allergic rhinitis  Reviewed UC notes/eval/results 3/3   Will try to stop cough cycle with tussionex and tessalon  Prednisone for bronchitis Cxr today : clear  Update if not starting to improve in a week or if worsening  Call back and Er precautions noted in detail today

## 2024-03-03 NOTE — Assessment & Plan Note (Addendum)
 With post viral cough syndrome  Reviewed note/eval and plan from UC on 3/3   Now rhonchi on exam  Continuous cough   Cxr ordered : clear   Prescription prednisone 30 mg taper, tessalon and tussionex with caution of sedation   Update if not starting to improve in a week or if worsening  Call back and Er precautions noted in detail today

## 2024-03-03 NOTE — Assessment & Plan Note (Signed)
 No doubt adding to cyclic cough   Plan to continue Flonase daily  Zyrtec 10 mg daily   Allergen avoidance when able

## 2024-03-23 ENCOUNTER — Encounter (INDEPENDENT_AMBULATORY_CARE_PROVIDER_SITE_OTHER): Payer: Self-pay | Admitting: Family Medicine

## 2024-03-23 ENCOUNTER — Other Ambulatory Visit: Payer: Self-pay | Admitting: Family Medicine

## 2024-03-23 ENCOUNTER — Ambulatory Visit (INDEPENDENT_AMBULATORY_CARE_PROVIDER_SITE_OTHER): Admitting: Family Medicine

## 2024-03-23 VITALS — BP 125/78 | HR 74 | Temp 98.4°F | Ht 66.0 in | Wt 206.0 lb

## 2024-03-23 DIAGNOSIS — Z6833 Body mass index (BMI) 33.0-33.9, adult: Secondary | ICD-10-CM | POA: Diagnosis not present

## 2024-03-23 DIAGNOSIS — Z1331 Encounter for screening for depression: Secondary | ICD-10-CM

## 2024-03-23 DIAGNOSIS — E7800 Pure hypercholesterolemia, unspecified: Secondary | ICD-10-CM | POA: Diagnosis not present

## 2024-03-23 DIAGNOSIS — R5383 Other fatigue: Secondary | ICD-10-CM

## 2024-03-23 DIAGNOSIS — R7303 Prediabetes: Secondary | ICD-10-CM | POA: Diagnosis not present

## 2024-03-23 DIAGNOSIS — G43909 Migraine, unspecified, not intractable, without status migrainosus: Secondary | ICD-10-CM

## 2024-03-23 DIAGNOSIS — E538 Deficiency of other specified B group vitamins: Secondary | ICD-10-CM

## 2024-03-23 DIAGNOSIS — E559 Vitamin D deficiency, unspecified: Secondary | ICD-10-CM | POA: Diagnosis not present

## 2024-03-23 DIAGNOSIS — R0602 Shortness of breath: Secondary | ICD-10-CM | POA: Diagnosis not present

## 2024-03-23 DIAGNOSIS — E669 Obesity, unspecified: Secondary | ICD-10-CM

## 2024-03-23 DIAGNOSIS — G43709 Chronic migraine without aura, not intractable, without status migrainosus: Secondary | ICD-10-CM

## 2024-03-23 DIAGNOSIS — E785 Hyperlipidemia, unspecified: Secondary | ICD-10-CM

## 2024-03-23 NOTE — Progress Notes (Signed)
 Office: [phone]  /  Fax: [phone]  WEIGHT SUMMARY AND BIOMETRICS  Anthropometric Measurements Height: 5\' 6"  (1.676 m) Weight: 206 lb (93.4 kg) BMI (Calculated): 33.27 Weight at Last Visit: N/a Weight Lost Since Last Visit: N/a Weight Gained Since Last Visit: N/a Starting Weight: 206lb Total Weight Loss (lbs): 0 lb (0 kg) Peak Weight: 220lb Waist Measurement : 42 inches   Body Composition  Body Fat %: 44.5 % Fat Mass (lbs): 91.8 lbs Muscle Mass (lbs): 108.8 lbs Total Body Water (lbs): 76.6 lbs Visceral Fat Rating : 13   Other Clinical Data RMR: [id number] Fasting: Yes Labs: Yes Today's Visit #: 1 Starting Date: 03/23/24    Chief Complaint: OBESITY   History of Present Illness Pamela Anthony is a 68 year old female who presents for assessment and workup to determine the best treatment plan for obesity.  She has a history of obesity with a peak weight of 220 pounds and has successfully lost about 20 pounds, maintaining most of that weight loss. Her goal is to reach 165 pounds within six months. She has tried various weight loss methods, including a diet excluding dairy and carbohydrates, which made her feel the best. However, she faces challenges in maintaining weight loss due to hunger and cravings, particularly for sweet and salty carbohydrates. She snacks on chips, nuts, chocolate, lifesavers, orange mints, and protein bars, especially in the evenings. She identifies snacking as her worst food habit and sometimes struggles with poor food choices. Emotional eating is a concern, particularly when stressed, sad, bored, or angry, and she sometimes hides food due to feeling judged.  She has a history of hyperlipidemia, B12 deficiency, and vitamin D deficiency. Additionally, she has a history of prediabetes with an A1c of 5.7 in 2021, which improved to 4.7 in 2022. She is not currently on any medications for these conditions.  She experiences fatigue and shortness of  breath, which have worsened with weight gain. She also has a history of migraines for which she takes topiramate. On the day of the visit, she woke up with a headache.  She has a history of sleep apnea, but her Epworth sleepiness score is 6.  Her resting energy expenditure was measured at 1598 kcal/day by indirect calorimetry, and her basal metabolic rate was 1591 kcal/day by bioimpedance scale which is to expected rate.      PHYSICAL EXAM:  Blood pressure 125/78, pulse 74, temperature 98.4 F (36.9 C), height 5\' 6"  (1.676 m), weight 206 lb (93.4 kg), last menstrual period 04/17/2011, SpO2 98%. Body mass index is 33.25 kg/m.  DIAGNOSTIC DATA REVIEWED:  BMET    Component Value Date/Time   NA 144 10/28/2023 0853   K 3.6 10/28/2023 0853   CL 104 10/28/2023 0853   CO2 31 10/28/2023 0853   GLUCOSE 96 10/28/2023 0853   BUN 22 10/28/2023 0853   CREATININE 0.69 10/28/2023 0853   CREATININE 0.63 10/31/2021 1452   CALCIUM 9.1 10/28/2023 0853   GFRNONAA 93.04 02/28/2010 1214   GFRAA 113 06/21/2008 1405   Lab Results  Component Value Date   HGBA1C 5.1 10/28/2023   HGBA1C 5.5 09/14/2015   No results found for: "INSULIN" Lab Results  Component Value Date   TSH 2.70 10/28/2023   CBC    Component Value Date/Time   WBC 4.6 10/28/2023 0853   RBC 4.33 10/28/2023 0853   HGB 13.2 10/28/2023 0853   HCT 39.5 10/28/2023 0853   PLT 232.0 10/28/2023 0853   MCV 91.3 10/28/2023 0853  MCH 29.3 10/31/2021 1452   MCHC 33.5 10/28/2023 0853   RDW 13.1 10/28/2023 0853   Iron Studies    Component Value Date/Time   IRON 72 09/05/2022 1147   FERRITIN 4.4 (L) 09/05/2008 1025   IRONPCTSAT 8.4 (L) 06/21/2008 1405   Lipid Panel     Component Value Date/Time   CHOL 138 10/28/2023 0853   TRIG 106.0 10/28/2023 0853   HDL 43.00 10/28/2023 0853   CHOLHDL 3 10/28/2023 0853   VLDL 21.2 10/28/2023 0853   LDLCALC 73 10/28/2023 0853   LDLCALC 59 10/31/2021 1452   LDLDIRECT 182.2 08/25/2013 0814    Hepatic Function Panel     Component Value Date/Time   PROT 6.6 10/28/2023 0853   ALBUMIN 4.5 10/28/2023 0853   AST 15 10/28/2023 0853   ALT 12 10/28/2023 0853   ALKPHOS 65 10/28/2023 0853   BILITOT 0.6 10/28/2023 0853   BILIDIR 0.0 02/28/2010 1214      Component Value Date/Time   TSH 2.70 10/28/2023 0853   Nutritional Lab Results  Component Value Date   VD25OH 53.97 10/28/2023   VD25OH 66.00 10/28/2022   VD25OH 46.83 09/05/2022     Assessment and Plan Assessment & Plan Obesity   She has obesity with a peak weight of 220 lbs and has lost about 20 lbs, maintaining most of that loss. She experiences fatigue and dyspnea, which have worsened with weight gain. She has prediabetes with an A1c of 5.7 in 2021, improved to 4.7 in 2022. She is not interested in bariatric surgery. Discussed set point theory and the body's resistance to weight loss, including decreased metabolism, increased hunger, and fat storage. Her resting metabolic rate is close to expected, indicating potential for weight loss without initial need to increase metabolism. Emphasized the importance of maintaining metabolism and discussed the role of fluctuating blood sugars and emotional eating in weight management. The eating plan will provide more information on her body's response to different foods and help identify triggers for hunger and cravings.   - Initiate Category 2 eating plan with regular foods and meal choices.   - Avoid excessive calorie restriction to prevent metabolic rate drop.   - Ensure adequate intake of essential nutrients.   - Avoid home weighing to focus on behaviors and feelings rather than numbers.   - Follow up in two weeks to assess progress and adjust plan as needed.    Fatigue and Shortness of breath with exercise Appears to be weight related at least in part - Check labs to rule out other causes of fatigue -IC done today to determine  REE -Start prescribed eating plan  Migraine    She is on topiramate for migraines and reported a headache on the day of the visit. Advised that dehydration can exacerbate headaches and emphasized the importance of hydration, especially during fluid shifts associated with dietary changes.   - Ensure adequate hydration to prevent exacerbation of headaches.    Hyperlipidemia   She has hyperlipidemia. Specific lipid levels or treatment adjustments were not discussed during this visit.    Vitamin B12 Deficiency   She has vitamin B12 deficiency. Plans to verify current vitamin levels to ensure they are within the correct range.   - Check vitamin B12 levels.    Vitamin D Deficiency   She has vitamin D deficiency. Plans to verify current vitamin levels to ensure they are within the correct range.   - Check vitamin D levels.    General Health Maintenance  Discussed the importance of lifestyle modifications, including dietary changes and hydration, as part of her overall health maintenance. Preparing for retirement, which can impact weight and health. Retirement can trigger weight gain, and strategies will be prepared to maintain health and weight during this transition.   - Prepare strategies for maintaining health and weight during retirement.    Follow-up   Scheduled for follow-up to assess the effectiveness of the dietary plan and make necessary adjustments. Will receive a copy of lab results at the same time as the provider, with instructions not to make changes based on these results until discussed.   - Follow up in two weeks to review progress and lab results.   - Schedule another appointment two weeks after the next visit.     I have personally spent 48 minutes total time today in preparation for visit reviewing old labs and office notes, direct patient care, and documentation for this visit, including the following   She was informed of the importance of frequent follow up visits to maximize her success with intensive lifestyle  modifications for her multiple health conditions.    Jasmine Mesi, MD

## 2024-03-24 LAB — FOLATE: Folate: 7.7 ng/mL (ref >3.0)

## 2024-03-24 LAB — INSULIN, RANDOM: INSULIN: 15.7 u[IU]/mL (ref 2.6–24.9)

## 2024-03-24 LAB — COMPREHENSIVE METABOLIC PANEL WITH GFR
ALT: 14 IU/L (ref 0–32)
AST: 18 IU/L (ref 0–40)
Albumin: 4.6 g/dL (ref 3.9–4.9)
Alkaline Phosphatase: 78 IU/L (ref 44–121)
BUN/Creatinine Ratio: 30 — ABNORMAL HIGH (ref 12–28)
BUN: 21 mg/dL (ref 8–27)
Bilirubin Total: 0.5 mg/dL (ref 0.0–1.2)
CO2: 23 mmol/L (ref 20–29)
Calcium: 8.9 mg/dL (ref 8.7–10.3)
Chloride: 105 mmol/L (ref 96–106)
Creatinine, Ser: 0.69 mg/dL (ref 0.57–1.00)
Globulin, Total: 2.1 g/dL (ref 1.5–4.5)
Glucose: 92 mg/dL (ref 70–99)
Potassium: 4.4 mmol/L (ref 3.5–5.2)
Sodium: 142 mmol/L (ref 134–144)
Total Protein: 6.7 g/dL (ref 6.0–8.5)
eGFR: 95 mL/min/{1.73_m2} (ref >59)

## 2024-03-24 LAB — CBC WITH DIFFERENTIAL/PLATELET
Basophils Absolute: 0 10*3/uL (ref 0.0–0.2)
Basos: 1 %
EOS (ABSOLUTE): 0.1 10*3/uL (ref 0.0–0.4)
Eos: 3 %
Hematocrit: 40.2 % (ref 34.0–46.6)
Hemoglobin: 13.2 g/dL (ref 11.1–15.9)
Immature Grans (Abs): 0 10*3/uL (ref 0.0–0.1)
Immature Granulocytes: 1 %
Lymphocytes Absolute: 1.1 10*3/uL (ref 0.7–3.1)
Lymphs: 25 %
MCH: 29.9 pg (ref 26.6–33.0)
MCHC: 32.8 g/dL (ref 31.5–35.7)
MCV: 91 fL (ref 79–97)
Monocytes Absolute: 0.3 10*3/uL (ref 0.1–0.9)
Monocytes: 8 %
Neutrophils Absolute: 2.9 10*3/uL (ref 1.4–7.0)
Neutrophils: 62 %
Platelets: 213 10*3/uL (ref 150–450)
RBC: 4.41 x10E6/uL (ref 3.77–5.28)
RDW: 13 % (ref 11.7–15.4)
WBC: 4.6 10*3/uL (ref 3.4–10.8)

## 2024-03-24 LAB — LIPID PANEL
Chol/HDL Ratio: 3.2 ratio (ref 0.0–4.4)
Cholesterol, Total: 152 mg/dL (ref 100–199)
HDL: 47 mg/dL (ref >39)
LDL Chol Calc (NIH): 81 mg/dL (ref 0–99)
Triglycerides: 136 mg/dL (ref 0–149)
VLDL Cholesterol Cal: 24 mg/dL (ref 5–40)

## 2024-03-24 LAB — VITAMIN B12: Vitamin B-12: 638 pg/mL (ref 232–1245)

## 2024-03-24 LAB — HEMOGLOBIN A1C
Est. average glucose Bld gHb Est-mCnc: 108 mg/dL
Hgb A1c MFr Bld: 5.4 % (ref 4.8–5.6)

## 2024-03-24 LAB — TSH: TSH: 2.7 u[IU]/mL (ref 0.450–4.500)

## 2024-03-24 LAB — MAGNESIUM: Magnesium: 2.1 mg/dL (ref 1.6–2.3)

## 2024-03-24 LAB — VITAMIN D 25 HYDROXY (VIT D DEFICIENCY, FRACTURES): Vit D, 25-Hydroxy: 40.8 ng/mL (ref 30.0–100.0)

## 2024-03-24 LAB — T4, FREE: Free T4: 1.03 ng/dL (ref 0.82–1.77)

## 2024-03-30 DIAGNOSIS — H18831 Recurrent erosion of cornea, right eye: Secondary | ICD-10-CM | POA: Diagnosis not present

## 2024-04-06 ENCOUNTER — Encounter (INDEPENDENT_AMBULATORY_CARE_PROVIDER_SITE_OTHER): Payer: Self-pay | Admitting: Family Medicine

## 2024-04-06 ENCOUNTER — Ambulatory Visit (INDEPENDENT_AMBULATORY_CARE_PROVIDER_SITE_OTHER): Admitting: Family Medicine

## 2024-04-06 VITALS — BP 98/66 | HR 74 | Temp 98.3°F | Ht 66.0 in | Wt 202.0 lb

## 2024-04-06 DIAGNOSIS — E559 Vitamin D deficiency, unspecified: Secondary | ICD-10-CM

## 2024-04-06 DIAGNOSIS — E669 Obesity, unspecified: Secondary | ICD-10-CM | POA: Diagnosis not present

## 2024-04-06 DIAGNOSIS — Z6832 Body mass index (BMI) 32.0-32.9, adult: Secondary | ICD-10-CM

## 2024-04-06 DIAGNOSIS — E88819 Insulin resistance, unspecified: Secondary | ICD-10-CM

## 2024-04-06 MED ORDER — VITAMIN D (ERGOCALCIFEROL) 1.25 MG (50000 UNIT) PO CAPS: 50000.0000 [IU] | ORAL_CAPSULE | ORAL | 0 refills | Status: DC

## 2024-04-06 NOTE — Addendum Note (Signed)
 Addended by: Jasmine Mesi D on: 04/06/2024 03:26 PM   Modules accepted: Level of Service

## 2024-04-06 NOTE — Progress Notes (Signed)
 Office: [phone]  /  Fax: [phone]  WEIGHT SUMMARY AND BIOMETRICS  Anthropometric Measurements Height: 5\' 6"  (1.676 m) Weight: 202 lb (91.6 kg) BMI (Calculated): 32.62 Weight at Last Visit: 206lb Weight Lost Since Last Visit: 4lb Weight Gained Since Last Visit: 0 Starting Weight: 206lb Total Weight Loss (lbs): 4 lb (1.814 kg) Peak Weight: 220lb   Body Composition  Body Fat %: 44.4 % Fat Mass (lbs): 90 lbs Muscle Mass (lbs): 107 lbs Total Body Water (lbs): 78.8 lbs Visceral Fat Rating : 13   Other Clinical Data Fasting: no Labs: no Today's Visit #: 2 Starting Date: 03/23/24    Chief Complaint: OBESITY   History of Present Illness Pamela Anthony is a 68 year old female who presents for a follow-up on her obesity treatment plan.  She is consistently adhering to a category two eating plan, finding it manageable but requiring adjustments, particularly with increased portion sizes for lunch and dinner. She has modified her intake to two ounces of protein for breakfast and lunch, preferring a protein bar for breakfast and a smoothie for lunch. The bread option is acceptable but not preferable for daily consumption.  She engages in physical activity by walking for exercise, twenty minutes two times a week. Since her last visit two weeks ago, she has lost four pounds.  She experiences hunger from breakfast to lunch, managing it with orange lifesavers as a snack. She feels satiated from lunch to supper but sometimes wakes up hungry in the morning. She experiences increased hunger signals later in the day, particularly around 3 PM.  Her recent lab results show a vitamin D  level of 40, which is below the desired range of 50 to 60. She currently takes vitamin D3 daily and B12 twice a week. Her B12 level is 638, which is satisfactory. She also takes fiber pills.  Her fasting glucose level is 92, and her A1c is 5.4, which is higher than previous levels but still within  normal range. Her insulin  level was 15, indicating insulin  resistance, despite having good blood sugar control.      PHYSICAL EXAM:  Blood pressure 98/66, pulse 74, temperature 98.3 F (36.8 C), height 5\' 6"  (1.676 m), weight 202 lb (91.6 kg), last menstrual period 04/17/2011, SpO2 99%. Body mass index is 32.6 kg/m.  DIAGNOSTIC DATA REVIEWED:  BMET    Component Value Date/Time   NA 142 03/23/2024 0833   K 4.4 03/23/2024 0833   CL 105 03/23/2024 0833   CO2 23 03/23/2024 0833   GLUCOSE 92 03/23/2024 0833   GLUCOSE 96 10/28/2023 0853   BUN 21 03/23/2024 0833   CREATININE 0.69 03/23/2024 0833   CREATININE 0.63 10/31/2021 1452   CALCIUM  8.9 03/23/2024 0833   GFRNONAA 93.04 02/28/2010 1214   GFRAA 113 06/21/2008 1405   Lab Results  Component Value Date   HGBA1C 5.4 03/23/2024   HGBA1C 5.5 09/14/2015   Lab Results  Component Value Date   INSULIN  15.7 03/23/2024   Lab Results  Component Value Date   TSH 2.700 03/23/2024   CBC    Component Value Date/Time   WBC 4.6 03/23/2024 0833   WBC 4.6 10/28/2023 0853   RBC 4.41 03/23/2024 0833   RBC 4.33 10/28/2023 0853   HGB 13.2 03/23/2024 0833   HCT 40.2 03/23/2024 0833   PLT 213 03/23/2024 0833   MCV 91 03/23/2024 0833   MCH 29.9 03/23/2024 0833   MCH 29.3 10/31/2021 1452   MCHC 32.8 03/23/2024 0833  MCHC 33.5 10/28/2023 0853   RDW 13.0 03/23/2024 0833   Iron Studies    Component Value Date/Time   IRON 72 09/05/2022 1147   FERRITIN 4.4 (L) 09/05/2008 1025   IRONPCTSAT 8.4 (L) 06/21/2008 1405   Lipid Panel     Component Value Date/Time   CHOL 152 03/23/2024 0833   TRIG 136 03/23/2024 0833   HDL 47 03/23/2024 0833   CHOLHDL 3.2 03/23/2024 0833   CHOLHDL 3 10/28/2023 0853   VLDL 21.2 10/28/2023 0853   LDLCALC 81 03/23/2024 0833   LDLCALC 59 10/31/2021 1452   LDLDIRECT 182.2 08/25/2013 0814   Hepatic Function Panel     Component Value Date/Time   PROT 6.7 03/23/2024 0833   ALBUMIN 4.6 03/23/2024 0833    AST 18 03/23/2024 0833   ALT 14 03/23/2024 0833   ALKPHOS 78 03/23/2024 0833   BILITOT 0.5 03/23/2024 0833   BILIDIR 0.0 02/28/2010 1214      Component Value Date/Time   TSH 2.700 03/23/2024 0833   Nutritional Lab Results  Component Value Date   VD25OH 40.8 03/23/2024   VD25OH 53.97 10/28/2023   VD25OH 66.00 10/28/2022     Assessment and Plan Assessment & Plan Obesity Obesity management focuses on dietary modifications and exercise. She follows a category two eating plan and has lost four pounds in the last two weeks. She exercises by walking twenty minutes twice a week. She reports difficulty with the protein intake required by the plan and prefers smoothies for lunch, consuming orange lifesavers as snacks. Emphasized the importance of protein intake and mindful carbohydrate consumption due to insulin  resistance. - Continue category two eating plan with modifications to include a smoothie for lunch. - Lunch nutrition needs to be 400-500 calories and 30+ gm protein - Encourage walking for exercise twenty minutes twice a week. - Provide a smoothie recipe to meet nutritional requirements. - Educate on smart carbohydrate choices and the importance of protein intake.  Insulin  resistance Insulin  resistance is indicated by an elevated fasting insulin  level of fifteen, three times higher than normal. She is in stage one insulin  resistance, not yet prediabetic. Explained the genetic component of insulin  resistance and its impact on weight gain and hunger signals. Advised focusing on protein intake and smart carbohydrate choices. - Provide educational material on insulin  resistance. - Encourage protein intake and smart carbohydrate choices.  Vitamin D  deficiency Vitamin D  level has decreased from fifty-three to forty. She currently takes vitamin D3 daily. Explained that low vitamin D  levels can cause tiredness and sluggishness. Prescription strength vitamin D  is recommended to increase  levels. - Prescribe vitamin D  50,000 IU once a week for three months. - Send prescription to [facility] on [address] in [place]. - Recheck vitamin D  levels after three months.     I have personally spent 43 minutes total time today in preparation, patient care, and documentation for this visit, including the following: review of clinical lab tests; review of medical history, review of new dx insulin  resistance, discussing the pathophysiology of this condition and implications for her nutritional requirements.   She was informed of the importance of frequent follow up visits to maximize her success with intensive lifestyle modifications for her multiple health conditions.    Jasmine Mesi, MD

## 2024-04-20 ENCOUNTER — Ambulatory Visit (INDEPENDENT_AMBULATORY_CARE_PROVIDER_SITE_OTHER): Admitting: Family Medicine

## 2024-04-20 ENCOUNTER — Encounter (INDEPENDENT_AMBULATORY_CARE_PROVIDER_SITE_OTHER): Payer: Self-pay | Admitting: Family Medicine

## 2024-04-20 VITALS — BP 123/75 | HR 70 | Temp 98.1°F | Ht 66.0 in | Wt 203.0 lb

## 2024-04-20 DIAGNOSIS — E88819 Insulin resistance, unspecified: Secondary | ICD-10-CM | POA: Diagnosis not present

## 2024-04-20 DIAGNOSIS — E669 Obesity, unspecified: Secondary | ICD-10-CM

## 2024-04-20 DIAGNOSIS — Z6832 Body mass index (BMI) 32.0-32.9, adult: Secondary | ICD-10-CM | POA: Diagnosis not present

## 2024-04-20 DIAGNOSIS — E559 Vitamin D deficiency, unspecified: Secondary | ICD-10-CM

## 2024-04-20 DIAGNOSIS — E66811 Obesity, class 1: Secondary | ICD-10-CM

## 2024-04-20 MED ORDER — METFORMIN HCL 500 MG PO TABS: 500.0000 mg | ORAL_TABLET | Freq: Every morning | ORAL | 0 refills | Status: DC

## 2024-04-20 NOTE — Progress Notes (Signed)
 Office: [phone]  /  Fax: [phone]  WEIGHT SUMMARY AND BIOMETRICS  Anthropometric Measurements Height: 5\' 6"  (1.676 m) Weight: 203 lb (92.1 kg) BMI (Calculated): 32.78 Weight at Last Visit: 202 lb Weight Lost Since Last Visit: 0 Weight Gained Since Last Visit: 1 lb Starting Weight: 206 lb Total Weight Loss (lbs): 3 lb (1.361 kg) Peak Weight: 220 lb   Body Composition  Body Fat %: 46 % Fat Mass (lbs): 93.6 lbs Muscle Mass (lbs): 104.4 lbs Total Body Water (lbs): 79.6 lbs Visceral Fat Rating : 13   Other Clinical Data Fasting: no Labs: no Today's Visit #: 3 Starting Date: 03/23/24    Chief Complaint: OBESITY   History of Present Illness Bela Demarzo is a 68 year old female who presents for obesity treatment and progress assessment.  She is adhering to a category two eating plan 90% of the time but has gained one pound in the last two weeks, which she attributes to consuming four glasses of wine during a Mother's Day celebration.  She experiences frequent hunger and persistent 'food thoughts.' Meal replacement shakes at lunch do not satisfy her hunger and may contribute to abdominal bloating and puffiness in her hands. She avoids sugar-free mints with xylitol due to stomach discomfort.  She is not currently engaging in any exercise and finds it difficult to meet her protein requirements, particularly at lunch.  She is aware of her insulin  resistance but has not started any medication for this condition.  She reports abdominal bloating and some puffiness in her hands. No upcoming events that could impact her weight management efforts.      PHYSICAL EXAM:  Blood pressure 123/75, pulse 70, temperature 98.1 F (36.7 C), height 5\' 6"  (1.676 m), weight 203 lb (92.1 kg), last menstrual period 04/17/2011, SpO2 99%. Body mass index is 32.77 kg/m.  DIAGNOSTIC DATA REVIEWED:  BMET    Component Value Date/Time   NA 142 03/23/2024 0833   K 4.4 03/23/2024  0833   CL 105 03/23/2024 0833   CO2 23 03/23/2024 0833   GLUCOSE 92 03/23/2024 0833   GLUCOSE 96 10/28/2023 0853   BUN 21 03/23/2024 0833   CREATININE 0.69 03/23/2024 0833   CREATININE 0.63 10/31/2021 1452   CALCIUM  8.9 03/23/2024 0833   GFRNONAA 93.04 02/28/2010 1214   GFRAA 113 06/21/2008 1405   Lab Results  Component Value Date   HGBA1C 5.4 03/23/2024   HGBA1C 5.5 09/14/2015   Lab Results  Component Value Date   INSULIN  15.7 03/23/2024   Lab Results  Component Value Date   TSH 2.700 03/23/2024   CBC    Component Value Date/Time   WBC 4.6 03/23/2024 0833   WBC 4.6 10/28/2023 0853   RBC 4.41 03/23/2024 0833   RBC 4.33 10/28/2023 0853   HGB 13.2 03/23/2024 0833   HCT 40.2 03/23/2024 0833   PLT 213 03/23/2024 0833   MCV 91 03/23/2024 0833   MCH 29.9 03/23/2024 0833   MCH 29.3 10/31/2021 1452   MCHC 32.8 03/23/2024 0833   MCHC 33.5 10/28/2023 0853   RDW 13.0 03/23/2024 0833   Iron Studies    Component Value Date/Time   IRON 72 09/05/2022 1147   FERRITIN 4.4 (L) 09/05/2008 1025   IRONPCTSAT 8.4 (L) 06/21/2008 1405   Lipid Panel     Component Value Date/Time   CHOL 152 03/23/2024 0833   TRIG 136 03/23/2024 0833   HDL 47 03/23/2024 0833   CHOLHDL 3.2 03/23/2024 0833   CHOLHDL  3 10/28/2023 0853   VLDL 21.2 10/28/2023 0853   LDLCALC 81 03/23/2024 0833   LDLCALC 59 10/31/2021 1452   LDLDIRECT 182.2 08/25/2013 0814   Hepatic Function Panel     Component Value Date/Time   PROT 6.7 03/23/2024 0833   ALBUMIN 4.6 03/23/2024 0833   AST 18 03/23/2024 0833   ALT 14 03/23/2024 0833   ALKPHOS 78 03/23/2024 0833   BILITOT 0.5 03/23/2024 0833   BILIDIR 0.0 02/28/2010 1214      Component Value Date/Time   TSH 2.700 03/23/2024 0833   Nutritional Lab Results  Component Value Date   VD25OH 40.8 03/23/2024   VD25OH 53.97 10/28/2023   VD25OH 66.00 10/28/2022     Assessment and Plan Assessment & Plan Obesity Obesity with recent weight gain of one pound  over the last two weeks. She is following a category two eating plan with 90% adherence and no current exercise regimen. Reports increased hunger and bloating, possibly related to dietary choices. Muscle mass is decreasing, potentially contributing to reduced metabolism and difficulty in weight loss. Discussed the importance of adequate protein intake and muscle-challenging exercises to prevent further muscle loss and support metabolism. - Provide three additional lunch options to meet protein requirements. - Encourage consumption of at least 85 grams of protein daily. - Advise calorie intake between 1100 and 1300 calories per day. - Recommend starting with 10 minutes of muscle-challenging exercises most days of the week. - Discuss potential exercises such as stair walking, wearing ankle weights, or floor exercises targeting large muscle groups. - Encourage exploration of exercise videos for guidance.  Insulin  resistance Insulin  resistance contributing to increased hunger and difficulty in weight management. Discussed the role of insulin  resistance in weight gain and the potential benefits of medication to improve insulin  sensitivity. Metformin was discussed as a treatment option, highlighting its benefits in reducing insulin  levels, delaying diabetes onset, and aiding weight loss. Potential side effects include queasiness if taken on an empty stomach and diarrhea if consuming excessive calories or carbohydrates. Emphasized the safety and long-term use of metformin. - Prescribe metformin 500 mg once daily after the first meal of the day. - Provide a handout on metformin.  Vitamin D  deficiency Vitamin D  deficiency is being managed with prescription vitamin D  supplementation. She reports compliance with the current regimen. - Continue prescription vitamin D  supplementation.      She was informed of the importance of frequent follow up visits to maximize her success with intensive lifestyle  modifications for her multiple health conditions.    Jasmine Mesi, MD

## 2024-05-07 ENCOUNTER — Other Ambulatory Visit (INDEPENDENT_AMBULATORY_CARE_PROVIDER_SITE_OTHER): Payer: Self-pay | Admitting: Family Medicine

## 2024-05-09 ENCOUNTER — Other Ambulatory Visit (INDEPENDENT_AMBULATORY_CARE_PROVIDER_SITE_OTHER): Payer: Self-pay | Admitting: Family Medicine

## 2024-05-13 ENCOUNTER — Ambulatory Visit (INDEPENDENT_AMBULATORY_CARE_PROVIDER_SITE_OTHER): Admitting: Family Medicine

## 2024-05-13 ENCOUNTER — Encounter (INDEPENDENT_AMBULATORY_CARE_PROVIDER_SITE_OTHER): Payer: Self-pay | Admitting: Family Medicine

## 2024-05-13 VITALS — BP 125/81 | HR 72 | Temp 98.4°F | Ht 66.0 in | Wt 197.0 lb

## 2024-05-13 DIAGNOSIS — Z6833 Body mass index (BMI) 33.0-33.9, adult: Secondary | ICD-10-CM

## 2024-05-13 DIAGNOSIS — Z6831 Body mass index (BMI) 31.0-31.9, adult: Secondary | ICD-10-CM | POA: Diagnosis not present

## 2024-05-13 DIAGNOSIS — Z6832 Body mass index (BMI) 32.0-32.9, adult: Secondary | ICD-10-CM

## 2024-05-13 DIAGNOSIS — E669 Obesity, unspecified: Secondary | ICD-10-CM | POA: Diagnosis not present

## 2024-05-13 DIAGNOSIS — E88819 Insulin resistance, unspecified: Secondary | ICD-10-CM | POA: Diagnosis not present

## 2024-05-13 DIAGNOSIS — E559 Vitamin D deficiency, unspecified: Secondary | ICD-10-CM

## 2024-05-13 MED ORDER — VITAMIN D (ERGOCALCIFEROL) 1.25 MG (50000 UNIT) PO CAPS: 50000.0000 [IU] | ORAL_CAPSULE | ORAL | 0 refills | Status: DC

## 2024-05-13 MED ORDER — METFORMIN HCL 500 MG PO TABS: 500.0000 mg | ORAL_TABLET | Freq: Every morning | ORAL | 0 refills | Status: DC

## 2024-05-13 NOTE — Progress Notes (Signed)
 Office: [phone]  /  Fax: [phone]  WEIGHT SUMMARY AND BIOMETRICS  Anthropometric Measurements Height: 5\' 6"  (1.676 m) Weight: 197 lb (89.4 kg) BMI (Calculated): 31.81 Weight at Last Visit: 203 lb Weight Lost Since Last Visit: 6 lb Weight Gained Since Last Visit: 0 Starting Weight: 206 lb Total Weight Loss (lbs): 9 lb (4.082 kg) Peak Weight: 220 lb Waist Measurement : 42 inches   Body Composition  Body Fat %: 42.7 % Fat Mass (lbs): 84.2 lbs Muscle Mass (lbs): 107.4 lbs Total Body Water (lbs): 75.8 lbs Visceral Fat Rating : 12   Other Clinical Data Fasting: yes Labs: no Today's Visit #: 4 Starting Date: 03/23/24    Chief Complaint: OBESITY    History of Present Illness Pamela Anthony is a 68 year old female with obesity and prediabetes who presents for obesity treatment follow-up.  She has been adhering to her prescribed category two eating plan approximately ninety percent of the time and has incorporated walking for exercise, thirty minutes three times per week. She has achieved a weight loss of six pounds over the last three weeks.  She has a history of prediabetes and is currently taking metformin , which she administers around ten in the morning. She notes an increase in hunger around three in the afternoon but has not experienced any gastrointestinal issues with the medication. She requests a refill of her metformin  today.  She also has a history of vitamin D  deficiency and is due for a refill of her vitamin D  supplement.  In her social history, she is dealing with a sick fourteen-year-old dog diagnosed with diabetes, which has been a source of stress. Despite this, she reports that life has been okay and she has settled into her routine well. She works in [employer] and appreciates positive feedback, which she finds rare in her field.      PHYSICAL EXAM:  Blood pressure 125/81, pulse 72, temperature 98.4 F (36.9 C), height 5\' 6"  (1.676 m),  weight 197 lb (89.4 kg), last menstrual period 04/17/2011, SpO2 99%. Body mass index is 31.8 kg/m.  DIAGNOSTIC DATA REVIEWED:  BMET    Component Value Date/Time   NA 142 03/23/2024 0833   K 4.4 03/23/2024 0833   CL 105 03/23/2024 0833   CO2 23 03/23/2024 0833   GLUCOSE 92 03/23/2024 0833   GLUCOSE 96 10/28/2023 0853   BUN 21 03/23/2024 0833   CREATININE 0.69 03/23/2024 0833   CREATININE 0.63 10/31/2021 1452   CALCIUM  8.9 03/23/2024 0833   GFRNONAA 93.04 02/28/2010 1214   GFRAA 113 06/21/2008 1405   Lab Results  Component Value Date   HGBA1C 5.4 03/23/2024   HGBA1C 5.5 09/14/2015   Lab Results  Component Value Date   INSULIN  15.7 03/23/2024   Lab Results  Component Value Date   TSH 2.700 03/23/2024   CBC    Component Value Date/Time   WBC 4.6 03/23/2024 0833   WBC 4.6 10/28/2023 0853   RBC 4.41 03/23/2024 0833   RBC 4.33 10/28/2023 0853   HGB 13.2 03/23/2024 0833   HCT 40.2 03/23/2024 0833   PLT 213 03/23/2024 0833   MCV 91 03/23/2024 0833   MCH 29.9 03/23/2024 0833   MCH 29.3 10/31/2021 1452   MCHC 32.8 03/23/2024 0833   MCHC 33.5 10/28/2023 0853   RDW 13.0 03/23/2024 0833   Iron Studies    Component Value Date/Time   IRON 72 09/05/2022 1147   FERRITIN 4.4 (L) 09/05/2008 1025   IRONPCTSAT 8.4 (  L) 06/21/2008 1405   Lipid Panel     Component Value Date/Time   CHOL 152 03/23/2024 0833   TRIG 136 03/23/2024 0833   HDL 47 03/23/2024 0833   CHOLHDL 3.2 03/23/2024 0833   CHOLHDL 3 10/28/2023 0853   VLDL 21.2 10/28/2023 0853   LDLCALC 81 03/23/2024 0833   LDLCALC 59 10/31/2021 1452   LDLDIRECT 182.2 08/25/2013 0814   Hepatic Function Panel     Component Value Date/Time   PROT 6.7 03/23/2024 0833   ALBUMIN 4.6 03/23/2024 0833   AST 18 03/23/2024 0833   ALT 14 03/23/2024 0833   ALKPHOS 78 03/23/2024 0833   BILITOT 0.5 03/23/2024 0833   BILIDIR 0.0 02/28/2010 1214      Component Value Date/Time   TSH 2.700 03/23/2024 0833   Nutritional Lab  Results  Component Value Date   VD25OH 40.8 03/23/2024   VD25OH 53.97 10/28/2023   VD25OH 66.00 10/28/2022     Assessment and Plan Assessment & Plan Insulin  Resistance Currently on metformin , which aids hunger control but experiences increased hunger in the afternoon due to timing of dose. Metformin  taken at 10 AM likely wears off by 2-3 PM. Splitting the dose may help manage insulin  resistance effectively, especially for those without GI issues. - Increase metformin  to 500 mg twice a day - Ensure doses are at least four hours apart - Take metformin  with food  Obesity Actively working on weight loss with a category two eating plan and regular exercise. Lost six pounds in the last three weeks, adhering to diet plan 90% of the time and walking three times a week. Emphasized maintaining routine, meal planning, lean meat equivalents, and caloric content of foods like mayonnaise and peanut butter. Importance of portion control highlighted. - Continue category two eating plan - Continue walking for exercise three times per week - Educate on lean meat equivalents and caloric content of foods - Encourage use of [employer] light mayonnaise to save calories  Vitamin D  deficiency Continues management of vitamin D  deficiency. - Refill vitamin D  prescription  Follow-up Follow-up appointments scheduled for June 24 and July 15. Additional appointment in August suggested due to schedule constraints. - Schedule follow-up appointment in August     She was informed of the importance of frequent follow up visits to maximize her success with intensive lifestyle modifications for her multiple health conditions.    Jasmine Mesi, MD

## 2024-05-18 DIAGNOSIS — Z961 Presence of intraocular lens: Secondary | ICD-10-CM | POA: Diagnosis not present

## 2024-05-18 DIAGNOSIS — Z0100 Encounter for examination of eyes and vision without abnormal findings: Secondary | ICD-10-CM | POA: Diagnosis not present

## 2024-05-18 DIAGNOSIS — H43811 Vitreous degeneration, right eye: Secondary | ICD-10-CM | POA: Diagnosis not present

## 2024-05-18 DIAGNOSIS — H40003 Preglaucoma, unspecified, bilateral: Secondary | ICD-10-CM | POA: Diagnosis not present

## 2024-05-19 ENCOUNTER — Other Ambulatory Visit (INDEPENDENT_AMBULATORY_CARE_PROVIDER_SITE_OTHER): Payer: Self-pay | Admitting: Family Medicine

## 2024-05-29 ENCOUNTER — Encounter (INDEPENDENT_AMBULATORY_CARE_PROVIDER_SITE_OTHER): Payer: Self-pay | Admitting: Family Medicine

## 2024-05-31 ENCOUNTER — Encounter: Payer: Self-pay | Admitting: Family Medicine

## 2024-05-31 NOTE — Telephone Encounter (Signed)
 Dr. Randeen would like pt triaged, sent to pool and advised our triage nurse

## 2024-05-31 NOTE — Telephone Encounter (Signed)
 I spoke with pt.starting on  05/28/24 pt noticed swelling in both lower legs; rt lower leg is more swollen than lt. No lower leg redness, no warmth, and no pain. Pt notices more swelling at rt ankle;pt unable to keep legs up while at work.pt said in mornings after legs are up overnight the swelling goes down but shortly after being up on legs the swelling returns. Pt said she did fall on Tues or Wed of last week and scraped rt knee;no other injury noted and no pain upon wt bearing and no pain when sitting. Pt thinks her hands may be swollen also. Pt is not on blood thinner; pt has no CP and no SOB.05/30/24 BP was 107/68 P?. Pt said she is in no distress and prefers to see Dr Randeen. Pt scheduled appt with Dr Randeen on 06/02/24 at 9:30 with UC & ED precautions and pt voiced understanding; pt will try to elevate legs as much as possible.sending note to Dr Randeen and [employer].

## 2024-05-31 NOTE — Telephone Encounter (Signed)
 Will see patient then Agree with ER and UC precautions  Thanks

## 2024-06-01 ENCOUNTER — Encounter (INDEPENDENT_AMBULATORY_CARE_PROVIDER_SITE_OTHER): Payer: Self-pay

## 2024-06-01 ENCOUNTER — Ambulatory Visit (INDEPENDENT_AMBULATORY_CARE_PROVIDER_SITE_OTHER): Admitting: Family Medicine

## 2024-06-02 ENCOUNTER — Ambulatory Visit: Admitting: Family Medicine

## 2024-06-02 ENCOUNTER — Ambulatory Visit: Payer: Self-pay | Admitting: Family Medicine

## 2024-06-02 ENCOUNTER — Encounter: Payer: Self-pay | Admitting: Family Medicine

## 2024-06-02 VITALS — BP 112/76 | HR 71 | Temp 98.1°F | Ht 66.0 in | Wt 203.0 lb

## 2024-06-02 DIAGNOSIS — R7989 Other specified abnormal findings of blood chemistry: Secondary | ICD-10-CM | POA: Insufficient documentation

## 2024-06-02 DIAGNOSIS — Z23 Encounter for immunization: Secondary | ICD-10-CM

## 2024-06-02 DIAGNOSIS — S80211A Abrasion, right knee, initial encounter: Secondary | ICD-10-CM | POA: Diagnosis not present

## 2024-06-02 DIAGNOSIS — R600 Localized edema: Secondary | ICD-10-CM | POA: Diagnosis not present

## 2024-06-02 DIAGNOSIS — S80219A Abrasion, unspecified knee, initial encounter: Secondary | ICD-10-CM | POA: Insufficient documentation

## 2024-06-02 LAB — BASIC METABOLIC PANEL WITH GFR
BUN: 20 mg/dL (ref 6–23)
CO2: 24 meq/L (ref 19–32)
Calcium: 8.6 mg/dL (ref 8.4–10.5)
Chloride: 108 meq/L (ref 96–112)
Creatinine, Ser: 0.59 mg/dL (ref 0.40–1.20)
GFR: 93.36 mL/min (ref >60.00)
Glucose, Bld: 95 mg/dL (ref 70–99)
Potassium: 3.9 meq/L (ref 3.5–5.1)
Sodium: 139 meq/L (ref 135–145)

## 2024-06-02 LAB — TSH: TSH: 1.9 u[IU]/mL (ref 0.35–5.50)

## 2024-06-02 LAB — HEPATIC FUNCTION PANEL
ALT: 11 U/L (ref 0–35)
AST: 14 U/L (ref 0–37)
Albumin: 4.2 g/dL (ref 3.5–5.2)
Alkaline Phosphatase: 52 U/L (ref 39–117)
Bilirubin, Direct: 0.1 mg/dL (ref 0.0–0.3)
Total Bilirubin: 0.5 mg/dL (ref 0.2–1.2)
Total Protein: 6.5 g/dL (ref 6.0–8.3)

## 2024-06-02 LAB — BRAIN NATRIURETIC PEPTIDE: Pro B Natriuretic peptide (BNP): 150 pg/mL — ABNORMAL HIGH (ref 0.0–100.0)

## 2024-06-02 NOTE — Patient Instructions (Addendum)
 Keep drinking lots of water and watching sodium intake (processed foods)   Labs today   I think a combo or heat/ veins/ injury is causing swelling Exam is reassuring   Tetanus shot today  Keep the abrasions clean with soap and water  Watch for redness/swelling or drainage   Elevate feet when able   Use support sock if really uncomfortable

## 2024-06-02 NOTE — Assessment & Plan Note (Signed)
 Both knees, moreso on right since fall onto cement last week  Look to be healing Td today   Instructed to keep clean/ soap and water  Watch for signs and symptoms of infection   Update if not starting to improve in a week or if worsening  Call back and Er precautions noted in detail today

## 2024-06-02 NOTE — Progress Notes (Signed)
 Subjective:    Patient ID: Pamela Anthony, female    DOB: December 27, 1955, 68 y.o.   MRN: [id number]  HPI  Wt Readings from Last 3 Encounters:  06/02/24 203 lb (92.1 kg)  05/13/24 197 lb (89.4 kg)  04/20/24 203 lb (92.1 kg)   32.77 kg/m  Vitals:   06/02/24 0927  BP: 112/76  Pulse: 71  Temp: 98.1 F (36.7 C)  SpO2: 98%   Pt presents with c/o leg swelling   Started 6/20  Swelling in both lower legs- ? Worse on right No redness or warmth  Feels a little tight  Dependent-better with elevation   ? If hands or a little swollen (fingers tight but no visible swelling)   Also scraped right knee last week  Fell in parking lot - walking in [employer] down on knees and hands   The swelling happened after this    No cp  No shortness of breath  No urinary symptoms   Not eating more salty food  [facility]  Lost and then gained some   1100-1300 calories a day  Exercise- walking  Some stairs at work   Drinking lots of water   Some varicose veins- not bothersome   Newest medicine is metformin     Lab Results  Component Value Date   NA 142 03/23/2024   K 4.4 03/23/2024   CO2 23 03/23/2024   GLUCOSE 92 03/23/2024   BUN 21 03/23/2024   CREATININE 0.69 03/23/2024   CALCIUM  8.9 03/23/2024   GFR 90.29 10/28/2023   EGFR 95 03/23/2024   GFRNONAA 93.04 02/28/2010   Lab Results  Component Value Date   ALT 14 03/23/2024   AST 18 03/23/2024   ALKPHOS 78 03/23/2024   BILITOT 0.5 03/23/2024    Lab Results  Component Value Date   TSH 2.700 03/23/2024  No results found for: LABPROT      Patient Active Problem List   Diagnosis Date Noted   Abrasion, knee 06/02/2024   Pedal edema 06/02/2024   Disordered eating 10/28/2023   Obesity (BMI 30.0-34.9) 10/28/2023   Head pain 07/08/2023   Biceps muscle tear, left, sequela 07/08/2023   Estrogen deficiency 11/04/2022   Paresthesia of left leg 05/14/2022   Vaso-vagal reaction 03/29/2022    Elevated antinuclear antibody (ANA) level 09/20/2020   Colon cancer screening 03/18/2018   Fatigue 03/18/2018   Prediabetes 03/16/2015   Stress reaction 01/16/2015   Encounter for routine gynecological examination 09/01/2013   Depression with anxiety 01/18/2013   Post-menopausal 08/14/2012   Other screening mammogram 04/29/2011   Routine general medical examination at a health care facility 04/22/2011   Joint pain 02/28/2010   Myofascial pain syndrome 02/28/2010   Vitamin D  deficiency 03/08/2009   IRRITABLE BOWEL SYNDROME 06/27/2008   B12 deficiency 02/23/2008   POLYP, COLON 03/06/2007   Hyperlipidemia 03/06/2007   Allergic rhinitis 03/06/2007   GERD 03/06/2007   Diaphragmatic hernia 03/06/2007   OSA (obstructive sleep apnea) 03/06/2007   URINARY INCONTINENCE 03/06/2007   Migraine 03/06/2007   Past Medical History:  Diagnosis Date   Allergic rhinitis    Allergy    Anxiety    Arthritis    B12 deficiency    Bilateral swelling of feet    legs   Constipation    Degenerative disc disease    back, knee   Family history of adverse reaction to anesthesia    Fibromyalgia    Fungal infection 2005   lamasil  GERD (gastroesophageal reflux disease)    EGD 10/2004   Heart burn    Hemorrhoids, internal    colonoscopy 02/2008   Hyperlipemia    Iron deficiency anemia    Low back pain    Migraine headache    improved with medications   Obesity    Orthodontics    [facility]   Sleep apnea    no cpap   Swallowing difficulty    Urinary incontinence    Vaso-vagal reaction    occured after eye drops for cataract surgery, but pt reports this has happened before during medical procedures   Vitamin D  deficiency    mild   Past Surgical History:  Procedure Laterality Date   CATARACT EXTRACTION W/PHACO Left 03/25/2022   Procedure: CATARACT EXTRACTION PHACO AND INTRAOCULAR LENS PLACEMENT (IOC) LEFT;  Surgeon: Mittie Gaskin, MD;  Location: [facility];  Service:  Ophthalmology;  Laterality: Left;  8.88 1:13.0   CATARACT EXTRACTION W/PHACO Right 04/17/2022   Procedure: CATARACT EXTRACTION PHACO AND INTRAOCULAR LENS PLACEMENT (IOC) RIGHT 7.22 01:22.1;  Surgeon: Mittie Gaskin, MD;  Location: [facility];  Service: Ophthalmology;  Laterality: Right;  sleep apnea   CESAREAN SECTION  1985   COLONOSCOPY     DILATION AND CURETTAGE OF UTERUS  2005   HAND SURGERY  2000   nodule removed   KNEE SURGERY  1970's   right   NASAL SINUS SURGERY  2000's   right maxillary   TONSILLECTOMY AND ADENOIDECTOMY     TUBAL LIGATION  May 1985   Social History   Tobacco Use   Smoking status: Never   Smokeless tobacco: Never  Vaping Use   Vaping status: Never Used  Substance Use Topics   Alcohol use: Not Currently    Comment: occasional   Drug use: No   Family History  Problem Relation Age of Onset   Stroke Mother    Hyperlipidemia Mother    Hypertension Mother    Cancer Mother    COPD Mother    Hyperlipidemia Father    Hypertension Father    Coronary artery disease Father        in his 8's   Arthritis Father    Stroke Father    Heart disease Father        pacemaker   Prostate cancer Father    Breast cancer Maternal Grandmother    Cancer Maternal Grandmother    Breast cancer Cousin        mat cousin   Colon cancer Other    Colon polyps Neg Hx    Allergies  Allergen Reactions   Atorvastatin     REACTION: myalgias   Fluogen [Influenza Virus Vaccine]     High dose  Felt sick/achy    Haemophilus B Polysaccharide Vaccine     High dose  Felt sick/achy   Current Outpatient Medications on File Prior to Visit  Medication Sig Dispense Refill   Ascorbic Acid (VITAMIN C) 1000 MG tablet Take 1,000 mg by mouth daily.     cetirizine (ZYRTEC) 10 MG tablet Take 10 mg by mouth at bedtime.     Cholecalciferol (VITAMIN D -3 PO) Take 1 tablet by mouth daily.     Cyanocobalamin  (B-12 PO) Take by mouth daily.     Flaxseed, Linseed, (FLAXSEED OIL)  1000 MG CAPS Take 1,000 mg by mouth daily at 12 noon.     FLUoxetine  (PROZAC ) 20 MG capsule Take 2 capsules (40 mg total) by mouth daily. 180 capsule 3  fluticasone  (FLONASE ) 50 MCG/ACT nasal spray Place 2 sprays into both nostrils daily. 48 g 3   GLUCOMANNAN PO Take 600 mg by mouth 3 (three) times daily.     metFORMIN  (GLUCOPHAGE ) 500 MG tablet Take 1 tablet (500 mg total) by mouth in the morning. 30 tablet 0   mometasone (ELOCON) 0.1 % cream   0   Multiple Vitamins-Minerals (ANTIOXIDANT PO) Take by mouth daily at 12 noon. Dosage unknow     omeprazole  (PRILOSEC) 20 MG capsule Take 1 capsule (20 mg total) by mouth daily. 90 capsule 1   oxybutynin  (DITROPAN -XL) 10 MG 24 hr tablet Take 1 tablet (10 mg total) by mouth at bedtime. 90 tablet 3   rosuvastatin  (CRESTOR ) 10 MG tablet Take 1 tablet (10 mg total) by mouth daily. 90 tablet 3   topiramate  (TOPAMAX ) 50 MG tablet Take 1 tablet (50 mg total) by mouth at bedtime. 90 tablet 3   Vitamin D , Ergocalciferol , (DRISDOL ) 1.25 MG (50000 UNIT) CAPS capsule Take 1 capsule (50,000 Units total) by mouth every 7 (seven) days. 5 capsule 0   No current facility-administered medications on file prior to visit.    Review of Systems     Objective:   Physical Exam Constitutional:      General: She is not in acute distress.    Appearance: Normal appearance. She is well-developed. She is obese. She is not ill-appearing or diaphoretic.  HENT:     Head: Normocephalic and atraumatic.   Eyes:     Conjunctiva/sclera: Conjunctivae normal.     Pupils: Pupils are equal, round, and reactive to light.   Neck:     Thyroid : No thyromegaly.     Vascular: No carotid bruit or JVD.   Cardiovascular:     Rate and Rhythm: Normal rate and regular rhythm.     Heart sounds: Normal heart sounds.     No gallop.  Pulmonary:     Effort: Pulmonary effort is normal. No respiratory distress.     Breath sounds: Normal breath sounds. No stridor. No wheezing, rhonchi or  rales.     Comments: No crackles Abdominal:     General: There is no distension or abdominal bruit.     Palpations: Abdomen is soft.   Musculoskeletal:     Cervical back: Normal range of motion and neck supple.     Right lower leg: No edema.     Left lower leg: No edema.     Comments: No pitting edema in ankles or legs or feet   No tenderness of LEs No palp cords  Some varicosities noted   Lymphadenopathy:     Cervical: No cervical adenopathy.   Skin:    General: Skin is warm and dry.     Coloration: Skin is not pale.     Findings: No rash.     Comments: Small healing abrasions on both knees    Neurological:     Mental Status: She is alert.     Coordination: Coordination normal.     Deep Tendon Reflexes: Reflexes are normal and symmetric. Reflexes normal.   Psychiatric:        Mood and Affect: Mood normal.           Assessment & Plan:   Problem List Items Addressed This Visit       Musculoskeletal and Integument   Abrasion, knee   Both knees, moreso on right since fall onto cement last week  Look to be healing Td today  Instructed to keep clean/ soap and water  Watch for signs and symptoms of infection   Update if not starting to improve in a week or if worsening  Call back and Er precautions noted in detail today          Other   Pedal edema - Primary   Suspect multifactorial  Had a fall onto knees and hands last week (trauma)-started after that  Heat/ age/varicose veins/obesity noted also   No cardiac signs and symptoms   Reassuring exam / no pitting / normal perfusion   Encouraged good water intake and limited sodium /processed foods Elevation  Supp socks if needed  Labs today       Relevant Orders   Basic metabolic panel with GFR   Hepatic function panel   TSH   Brain natriuretic peptide

## 2024-06-02 NOTE — Assessment & Plan Note (Signed)
 Suspect multifactorial  Had a fall onto knees and hands last week (trauma)-started after that  Heat/ age/varicose veins/obesity noted also   No cardiac signs and symptoms   Reassuring exam / no pitting / normal perfusion   Encouraged good water intake and limited sodium /processed foods Elevation  Supp socks if needed  Labs today

## 2024-06-08 ENCOUNTER — Encounter (INDEPENDENT_AMBULATORY_CARE_PROVIDER_SITE_OTHER): Payer: Self-pay | Admitting: Family Medicine

## 2024-06-08 DIAGNOSIS — E88819 Insulin resistance, unspecified: Secondary | ICD-10-CM

## 2024-06-08 NOTE — Telephone Encounter (Signed)
 Last OV you increased her Metformin  to BID however new prescription was not sent to pharmacy. Medication has been pended if approved.

## 2024-06-09 MED ORDER — METFORMIN HCL 500 MG PO TABS
500.0000 mg | ORAL_TABLET | Freq: Two times a day (BID) | ORAL | 0 refills | Status: DC
Start: 2024-06-09 — End: 2024-06-22

## 2024-06-09 NOTE — Telephone Encounter (Signed)
Yes, please send it in  

## 2024-06-15 NOTE — Telephone Encounter (Signed)
 Please f/u with pt regarding echo referral

## 2024-06-22 ENCOUNTER — Encounter (INDEPENDENT_AMBULATORY_CARE_PROVIDER_SITE_OTHER): Payer: Self-pay | Admitting: Family Medicine

## 2024-06-22 ENCOUNTER — Other Ambulatory Visit (INDEPENDENT_AMBULATORY_CARE_PROVIDER_SITE_OTHER): Payer: Self-pay

## 2024-06-22 ENCOUNTER — Ambulatory Visit (INDEPENDENT_AMBULATORY_CARE_PROVIDER_SITE_OTHER): Admitting: Family Medicine

## 2024-06-22 VITALS — BP 129/75 | HR 67 | Temp 98.0°F | Ht 66.0 in | Wt 199.0 lb

## 2024-06-22 DIAGNOSIS — Z6832 Body mass index (BMI) 32.0-32.9, adult: Secondary | ICD-10-CM

## 2024-06-22 DIAGNOSIS — E669 Obesity, unspecified: Secondary | ICD-10-CM | POA: Diagnosis not present

## 2024-06-22 DIAGNOSIS — R799 Abnormal finding of blood chemistry, unspecified: Secondary | ICD-10-CM | POA: Diagnosis not present

## 2024-06-22 DIAGNOSIS — E88819 Insulin resistance, unspecified: Secondary | ICD-10-CM

## 2024-06-22 DIAGNOSIS — F4321 Adjustment disorder with depressed mood: Secondary | ICD-10-CM | POA: Diagnosis not present

## 2024-06-22 DIAGNOSIS — R600 Localized edema: Secondary | ICD-10-CM

## 2024-06-22 DIAGNOSIS — E66811 Obesity, class 1: Secondary | ICD-10-CM

## 2024-06-22 DIAGNOSIS — Z6831 Body mass index (BMI) 31.0-31.9, adult: Secondary | ICD-10-CM

## 2024-06-22 MED ORDER — METFORMIN HCL 500 MG PO TABS: 500.0000 mg | ORAL_TABLET | Freq: Two times a day (BID) | ORAL | 0 refills | Status: DC

## 2024-06-22 NOTE — Progress Notes (Signed)
 Office: [phone]  /  Fax: [phone]  WEIGHT SUMMARY AND BIOMETRICS  Anthropometric Measurements Height: 5' 6 (1.676 m) Weight: 199 lb (90.3 kg) BMI (Calculated): 32.13 Weight at Last Visit: 197 lb Weight Lost Since Last Visit: 0 Weight Gained Since Last Visit: 2 lb Starting Weight: 206 lb Total Weight Loss (lbs): 7 lb (3.175 kg) Peak Weight: 220 lb   Body Composition  Body Fat %: 42.9 % Fat Mass (lbs): 85.4 lbs Muscle Mass (lbs): 107.8 lbs Total Body Water (lbs): 81 lbs Visceral Fat Rating : 12   Other Clinical Data Fasting: no Labs: no Today's Visit #: 5 Starting Date: 03/23/24    Chief Complaint: OBESITY    History of Present Illness Pamela Anthony is a 68 year old female who presents for obesity treatment plan follow-up.  She has been adhering to the category two eating plan approximately 80% of the time and engages in physical activity by walking for 30 minutes three times per week. Despite these efforts, she has gained two pounds over the last five weeks. She attributes this weight gain to recent emotional distress following the loss of her pet, which has disrupted her usual structure and routine.  She has a history of insulin  resistance and is currently being treated with metformin . Her glucose levels are reportedly well-controlled. She requests a refill for her medication.  She has experienced swelling, which she attributes to a recent fall. Blood work revealed an elevated BNP; the patient has not yet been scheduled for an echocardiogram.  In her social history, she shares the recent loss of her pet, which has been emotionally challenging. This event has impacted her ability to maintain her usual structure and routine, contributing to her weight gain. She has stopped consuming diet sodas since October 2022 and now drinks unsweetened iced tea and flavored carbonated water instead.      PHYSICAL EXAM:  Blood pressure 129/75, pulse 67, temperature 98  F (36.7 C), height 5' 6 (1.676 m), weight 199 lb (90.3 kg), last menstrual period 04/17/2011, SpO2 99%. Body mass index is 32.12 kg/m.  DIAGNOSTIC DATA REVIEWED:  BMET    Component Value Date/Time   NA 139 06/02/2024 1005   NA 142 03/23/2024 0833   K 3.9 06/02/2024 1005   CL 108 06/02/2024 1005   CO2 24 06/02/2024 1005   GLUCOSE 95 06/02/2024 1005   BUN 20 06/02/2024 1005   BUN 21 03/23/2024 0833   CREATININE 0.59 06/02/2024 1005   CREATININE 0.63 10/31/2021 1452   CALCIUM  8.6 06/02/2024 1005   GFRNONAA 93.04 02/28/2010 1214   GFRAA 113 06/21/2008 1405   Lab Results  Component Value Date   HGBA1C 5.4 03/23/2024   HGBA1C 5.5 09/14/2015   Lab Results  Component Value Date   INSULIN  15.7 03/23/2024   Lab Results  Component Value Date   TSH 1.90 06/02/2024   CBC    Component Value Date/Time   WBC 4.6 03/23/2024 0833   WBC 4.6 10/28/2023 0853   RBC 4.41 03/23/2024 0833   RBC 4.33 10/28/2023 0853   HGB 13.2 03/23/2024 0833   HCT 40.2 03/23/2024 0833   PLT 213 03/23/2024 0833   MCV 91 03/23/2024 0833   MCH 29.9 03/23/2024 0833   MCH 29.3 10/31/2021 1452   MCHC 32.8 03/23/2024 0833   MCHC 33.5 10/28/2023 0853   RDW 13.0 03/23/2024 0833   Iron Studies    Component Value Date/Time   IRON 72 09/05/2022 1147   FERRITIN 4.4 (L)  09/05/2008 1025   IRONPCTSAT 8.4 (L) 06/21/2008 1405   Lipid Panel     Component Value Date/Time   CHOL 152 03/23/2024 0833   TRIG 136 03/23/2024 0833   HDL 47 03/23/2024 0833   CHOLHDL 3.2 03/23/2024 0833   CHOLHDL 3 10/28/2023 0853   VLDL 21.2 10/28/2023 0853   LDLCALC 81 03/23/2024 0833   LDLCALC 59 10/31/2021 1452   LDLDIRECT 182.2 08/25/2013 0814   Hepatic Function Panel     Component Value Date/Time   PROT 6.5 06/02/2024 1005   PROT 6.7 03/23/2024 0833   ALBUMIN 4.2 06/02/2024 1005   ALBUMIN 4.6 03/23/2024 0833   AST 14 06/02/2024 1005   ALT 11 06/02/2024 1005   ALKPHOS 52 06/02/2024 1005   BILITOT 0.5 06/02/2024  1005   BILITOT 0.5 03/23/2024 0833   BILIDIR 0.1 06/02/2024 1005      Component Value Date/Time   TSH 1.90 06/02/2024 1005   Nutritional Lab Results  Component Value Date   VD25OH 40.8 03/23/2024   VD25OH 53.97 10/28/2023   VD25OH 66.00 10/28/2022     Assessment and Plan Assessment & Plan Bilateral Edema with Elevated BNP Swelling attributed to fluid retention with elevated BNP, suggesting potential cardiac issues. An echocardiogram is necessary to evaluate cardiac function and establish a baseline for future reference. The echocardiogram is non-invasive and provides valuable data for future comparisons. - Order echocardiogram to evaluate cardiac function and investigate the cause of elevated BNP. - Monitor for symptoms of dehydration, especially during the low-carb diet.  Obesity Following the category two eating plan 80% of the time and walking 30 minutes three times per week. Despite efforts, gained two pounds in five weeks, likely due to fluid retention. Discussed sodium and carbohydrate intake's impact on fluid retention and emphasized dietary adherence. Proposed a strict low-carb eating plan for 2-4 weeks to reduce fluid retention and reset cravings, with a return to the category two plan thereafter. Explained potential dehydration risk, especially in summer, and that deviations can halt fat burning for two days. - Implement a strict low-carb eating plan for 2-4 weeks to reduce fluid retention and reset cravings, then return to the category two plan. - Encourage regular hydration with non-caffeinated, non-caloric liquids. - Continue walking for exercise 30 minutes three times per week.  Insulin  Resistance Insulin  resistance managed with metformin , with good glucose levels indicating effective management. - [facility] [place].  Grieving She is grieving the loss of her beloved dog, but appears to be managing her grief in an appropriate manner -  She was offered support and encouragement, will continue to monitor her mood at subsequent visits.  Follow-up Upcoming appointment in August and possibly September. Plan to follow up on echocardiogram results and dietary progress at the next visit. - Schedule follow-up appointment in four weeks to review echocardiogram results and assess progress with dietary changes.     She was informed of the importance of frequent follow up visits to maximize her success with intensive lifestyle modifications for her multiple health conditions.    Louann Penton, MD

## 2024-06-23 ENCOUNTER — Telehealth (INDEPENDENT_AMBULATORY_CARE_PROVIDER_SITE_OTHER): Payer: Self-pay

## 2024-06-23 NOTE — Telephone Encounter (Signed)
 Call to [facility] [employer]: Reference # [id number].   Procedure:  Echocardiogram Complete, CPT 93306. Spoke with Ruby, she was unable to complete the request, she transferred me to the Clinical Intake Department/Team.     Call was transferred to:  [phone], Spoke to [employer]. In the clinical intake department/team. Reference call:  Deward GRADE, 06/23/2024, 3:44 PM, EST.   Authorization has been approved effective today for 90 days, which ends/expires 09/23/2024. Another authorization is NOT required if needed to repeat within the year.    Call ended, no further needs.

## 2024-06-24 ENCOUNTER — Other Ambulatory Visit (INDEPENDENT_AMBULATORY_CARE_PROVIDER_SITE_OTHER): Payer: Self-pay

## 2024-06-24 ENCOUNTER — Telehealth (INDEPENDENT_AMBULATORY_CARE_PROVIDER_SITE_OTHER): Payer: Self-pay | Admitting: Family Medicine

## 2024-06-24 DIAGNOSIS — E88819 Insulin resistance, unspecified: Secondary | ICD-10-CM

## 2024-06-24 MED ORDER — METFORMIN HCL 500 MG PO TABS: 500.0000 mg | ORAL_TABLET | Freq: Two times a day (BID) | ORAL | 0 refills | Status: DC

## 2024-06-24 NOTE — Telephone Encounter (Signed)
 Halston with [facility] is calling asking for a prescription for Metformin  to be submitted. Please follow up with Halston at [phone].

## 2024-07-05 DIAGNOSIS — H43812 Vitreous degeneration, left eye: Secondary | ICD-10-CM | POA: Diagnosis not present

## 2024-07-05 DIAGNOSIS — H40003 Preglaucoma, unspecified, bilateral: Secondary | ICD-10-CM | POA: Diagnosis not present

## 2024-07-13 ENCOUNTER — Ambulatory Visit (INDEPENDENT_AMBULATORY_CARE_PROVIDER_SITE_OTHER): Admitting: Family Medicine

## 2024-07-13 ENCOUNTER — Encounter (INDEPENDENT_AMBULATORY_CARE_PROVIDER_SITE_OTHER): Payer: Self-pay | Admitting: Family Medicine

## 2024-07-13 VITALS — BP 99/59 | HR 74 | Temp 98.3°F | Ht 66.0 in | Wt 194.0 lb

## 2024-07-13 DIAGNOSIS — E559 Vitamin D deficiency, unspecified: Secondary | ICD-10-CM

## 2024-07-13 DIAGNOSIS — E66811 Obesity, class 1: Secondary | ICD-10-CM

## 2024-07-13 DIAGNOSIS — Z6831 Body mass index (BMI) 31.0-31.9, adult: Secondary | ICD-10-CM

## 2024-07-13 DIAGNOSIS — E669 Obesity, unspecified: Secondary | ICD-10-CM

## 2024-07-13 MED ORDER — VITAMIN D (ERGOCALCIFEROL) 1.25 MG (50000 UNIT) PO CAPS: 50000.0000 [IU] | ORAL_CAPSULE | ORAL | 0 refills | Status: DC

## 2024-07-13 NOTE — Progress Notes (Signed)
 Office: [phone]  /  Fax: [phone]  WEIGHT SUMMARY AND BIOMETRICS  Anthropometric Measurements Height: 5' 6 (1.676 m) Weight: 194 lb (88 kg) BMI (Calculated): 31.33 Weight at Last Visit: 199lb Weight Lost Since Last Visit: 5lb Weight Gained Since Last Visit: 0lb Starting Weight: 206lb Total Weight Loss (lbs): 12 lb (5.443 kg) Peak Weight: 220lb   Body Composition  Body Fat %: 43.5 % Fat Mass (lbs): 84.4 lbs Muscle Mass (lbs): 104 lbs Total Body Water (lbs): 73.8 lbs Visceral Fat Rating : 12   Other Clinical Data Fasting: yes Labs: No Today's Visit #: 6 Starting Date: 03/23/24    Chief Complaint: OBESITY    History of Present Illness Pamela Anthony is a 68 year old female who presents for obesity treatment and progress assessment.  She is adhering to a low-carb eating plan approximately ninety percent of the time and has lost five pounds over the past two weeks. However, she struggles to consume all of her protein and occasionally skips meals. Her sleep duration is less than the recommended seven to nine hours per night.  She engages in physical activity for thirty minutes twice a week and is working on improving her hydration. She consumes a significant amount of fluids and does not experience headaches.  Her current medications include prescription vitamin D  at a dose of fifty thousand international units per week, for which she requests a refill. Her hunger levels have improved with metformin  use, and she does not feel hungry for lunch after a late breakfast. She typically consumes a snack in the afternoon and has a reduced appetite for dinner, although she does eat.  She experiences sweet cravings and sometimes consumes sugar-free popsicles and uses Splenda in her coffee.  She plans to retire on Halloween and feels relieved after making this decision. She intends to spend her retirement doing work around [employer], exploring [place] , and visiting  family in [place]  and [place] .      PHYSICAL EXAM:  Blood pressure (!) 99/59, pulse 74, temperature 98.3 F (36.8 C), height 5' 6 (1.676 m), weight 194 lb (88 kg), last menstrual period 04/17/2011, SpO2 97%. Body mass index is 31.31 kg/m.  DIAGNOSTIC DATA REVIEWED:  BMET    Component Value Date/Time   NA 139 06/02/2024 1005   NA 142 03/23/2024 0833   K 3.9 06/02/2024 1005   CL 108 06/02/2024 1005   CO2 24 06/02/2024 1005   GLUCOSE 95 06/02/2024 1005   BUN 20 06/02/2024 1005   BUN 21 03/23/2024 0833   CREATININE 0.59 06/02/2024 1005   CREATININE 0.63 10/31/2021 1452   CALCIUM  8.6 06/02/2024 1005   GFRNONAA 93.04 02/28/2010 1214   GFRAA 113 06/21/2008 1405   Lab Results  Component Value Date   HGBA1C 5.4 03/23/2024   HGBA1C 5.5 09/14/2015   Lab Results  Component Value Date   INSULIN  15.7 03/23/2024   Lab Results  Component Value Date   TSH 1.90 06/02/2024   CBC    Component Value Date/Time   WBC 4.6 03/23/2024 0833   WBC 4.6 10/28/2023 0853   RBC 4.41 03/23/2024 0833   RBC 4.33 10/28/2023 0853   HGB 13.2 03/23/2024 0833   HCT 40.2 03/23/2024 0833   PLT 213 03/23/2024 0833   MCV 91 03/23/2024 0833   MCH 29.9 03/23/2024 0833   MCH 29.3 10/31/2021 1452   MCHC 32.8 03/23/2024 0833   MCHC 33.5 10/28/2023 0853   RDW 13.0 03/23/2024 0833   Iron Studies  Component Value Date/Time   IRON 72 09/05/2022 1147   FERRITIN 4.4 (L) 09/05/2008 1025   IRONPCTSAT 8.4 (L) 06/21/2008 1405   Lipid Panel     Component Value Date/Time   CHOL 152 03/23/2024 0833   TRIG 136 03/23/2024 0833   HDL 47 03/23/2024 0833   CHOLHDL 3.2 03/23/2024 0833   CHOLHDL 3 10/28/2023 0853   VLDL 21.2 10/28/2023 0853   LDLCALC 81 03/23/2024 0833   LDLCALC 59 10/31/2021 1452   LDLDIRECT 182.2 08/25/2013 0814   Hepatic Function Panel     Component Value Date/Time   PROT 6.5 06/02/2024 1005   PROT 6.7 03/23/2024 0833   ALBUMIN 4.2 06/02/2024 1005   ALBUMIN 4.6  03/23/2024 0833   AST 14 06/02/2024 1005   ALT 11 06/02/2024 1005   ALKPHOS 52 06/02/2024 1005   BILITOT 0.5 06/02/2024 1005   BILITOT 0.5 03/23/2024 0833   BILIDIR 0.1 06/02/2024 1005      Component Value Date/Time   TSH 1.90 06/02/2024 1005   Nutritional Lab Results  Component Value Date   VD25OH 40.8 03/23/2024   VD25OH 53.97 10/28/2023   VD25OH 66.00 10/28/2022     Assessment and Plan Assessment & Plan Obesity Obesity management with a low-carb diet and exercise. Adheres to the low-carb diet about 90% of the time and exercises twice a week for 30 minutes. Lost 5 pounds in the last two weeks. Hunger levels are manageable with current metformin  regimen. Challenges include inadequate protein intake and occasional meal skipping, which could affect metabolism. Hydration is adequate, and she is not experiencing headaches. Discussed the importance of adequate protein intake (at least 80 grams per day) to prevent metabolic slowdown. Low-carb diet is not intended for long-term use due to potential vitamin deficiencies from lack of fruit intake. - Continue low-carb diet until next visit - Ensure protein intake of at least 80 grams per day - Monitor weight and dietary adherence - Discuss potential transition from low-carb diet at next visit  Vitamin D  deficiency Vitamin D  deficiency managed with prescription vitamin D  supplementation. On 50,000 IU of vitamin D  weekly and requests a refill. No new symptoms reported. - Refill prescription for vitamin D  50,000 IU weekly - [facility] to [facility]      She was informed of the importance of frequent follow up visits to maximize her success with intensive lifestyle modifications for her multiple health conditions.    Louann Penton, MD

## 2024-07-14 ENCOUNTER — Ambulatory Visit (HOSPITAL_COMMUNITY)
Admission: RE | Admit: 2024-07-14 | Discharge: 2024-07-14 | Disposition: A | Discharge status: Home / Self Care | Source: Ambulatory Visit | Attending: Family Medicine | Admitting: Family Medicine

## 2024-07-14 DIAGNOSIS — E785 Hyperlipidemia, unspecified: Secondary | ICD-10-CM | POA: Insufficient documentation

## 2024-07-14 DIAGNOSIS — G4730 Sleep apnea, unspecified: Secondary | ICD-10-CM | POA: Insufficient documentation

## 2024-07-14 DIAGNOSIS — R600 Localized edema: Secondary | ICD-10-CM | POA: Insufficient documentation

## 2024-07-14 LAB — ECHOCARDIOGRAM COMPLETE
Area-P 1/2: 3.2 cm2
S' Lateral: 3.2 cm

## 2024-07-15 ENCOUNTER — Other Ambulatory Visit (INDEPENDENT_AMBULATORY_CARE_PROVIDER_SITE_OTHER): Payer: Self-pay | Admitting: Family Medicine

## 2024-07-15 DIAGNOSIS — E88819 Insulin resistance, unspecified: Secondary | ICD-10-CM

## 2024-07-22 ENCOUNTER — Ambulatory Visit (INDEPENDENT_AMBULATORY_CARE_PROVIDER_SITE_OTHER)

## 2024-07-22 ENCOUNTER — Ambulatory Visit
Admission: RE | Admit: 2024-07-22 | Discharge: 2024-07-22 | Disposition: A | Discharge status: Home / Self Care | Attending: Emergency Medicine

## 2024-07-22 VITALS — BP 125/85 | HR 76 | Temp 97.9°F | Resp 18

## 2024-07-22 DIAGNOSIS — M79671 Pain in right foot: Secondary | ICD-10-CM

## 2024-07-22 DIAGNOSIS — M19071 Primary osteoarthritis, right ankle and foot: Secondary | ICD-10-CM | POA: Diagnosis not present

## 2024-07-22 DIAGNOSIS — M7661 Achilles tendinitis, right leg: Secondary | ICD-10-CM | POA: Diagnosis not present

## 2024-07-22 DIAGNOSIS — M7989 Other specified soft tissue disorders: Secondary | ICD-10-CM | POA: Diagnosis not present

## 2024-07-22 DIAGNOSIS — M7731 Calcaneal spur, right foot: Secondary | ICD-10-CM | POA: Diagnosis not present

## 2024-07-22 NOTE — ED Provider Notes (Signed)
 Pamela Anthony    CSN: [id number] Arrival date & time: 07/22/24  1507      History   Chief Complaint Chief Complaint  Patient presents with   Foot Pain    Right foot pain - ball of foot & 2nd toe. - Entered by patient    HPI Pamela Anthony is a 68 y.o. female.  Patient presents with 4-day history of right foot pain, especially at the base of her second toe and on the ball of her foot.  The pain is worse with weightbearing.  The area of concern is mildly swollen and possibly bruised.  She does not know of any specific injury but states she may have stepped on a [place].  No open wounds, numbness, weakness, fever.  She has been treating her symptoms with ibuprofen and elevation of her foot when she is able.  The history is provided by the patient and medical records.    Past Medical History:  Diagnosis Date   Allergic rhinitis    Allergy    Anxiety    Arthritis    B12 deficiency    Bilateral swelling of feet    legs   Constipation    Degenerative disc disease    back, knee   Family history of adverse reaction to anesthesia    Fibromyalgia    Fungal infection 2005   lamasil    GERD (gastroesophageal reflux disease)    EGD 10/2004   Heart burn    Hemorrhoids, internal    colonoscopy 02/2008   Hyperlipemia    Iron deficiency anemia    Low back pain    Migraine headache    improved with medications   Obesity    Orthodontics    [facility]   Sleep apnea    no cpap   Swallowing difficulty    Urinary incontinence    Vaso-vagal reaction    occured after eye drops for cataract surgery, but pt reports this has happened before during medical procedures   Vitamin D  deficiency    mild    Patient Active Problem List   Diagnosis Date Noted   Abrasion, knee 06/02/2024   Pedal edema 06/02/2024   Elevated brain natriuretic peptide (BNP) level 06/02/2024   Disordered eating 10/28/2023   Obesity (BMI 30.0-34.9) 10/28/2023   Head  pain 07/08/2023   Biceps muscle tear, left, sequela 07/08/2023   Estrogen deficiency 11/04/2022   Paresthesia of left leg 05/14/2022   Vaso-vagal reaction 03/29/2022   Elevated antinuclear antibody (ANA) level 09/20/2020   Colon cancer screening 03/18/2018   Fatigue 03/18/2018   Prediabetes 03/16/2015   Stress reaction 01/16/2015   Encounter for routine gynecological examination 09/01/2013   Depression with anxiety 01/18/2013   Post-menopausal 08/14/2012   Other screening mammogram 04/29/2011   Routine general medical examination at a health care facility 04/22/2011   Joint pain 02/28/2010   Myofascial pain syndrome 02/28/2010   Vitamin D  deficiency 03/08/2009   IRRITABLE BOWEL SYNDROME 06/27/2008   B12 deficiency 02/23/2008   POLYP, COLON 03/06/2007   Hyperlipidemia 03/06/2007   Allergic rhinitis 03/06/2007   GERD 03/06/2007   Diaphragmatic hernia 03/06/2007   OSA (obstructive sleep apnea) 03/06/2007   URINARY INCONTINENCE 03/06/2007   Migraine 03/06/2007    Past Surgical History:  Procedure Laterality Date   CATARACT EXTRACTION W/PHACO Left 03/25/2022   Procedure: CATARACT EXTRACTION PHACO AND INTRAOCULAR LENS PLACEMENT (IOC) LEFT;  Surgeon: Mittie Gaskin, MD;  Location: [facility];  Service: Ophthalmology;  Laterality: Left;  8.88 1:13.0   CATARACT EXTRACTION W/PHACO Right 04/17/2022   Procedure: CATARACT EXTRACTION PHACO AND INTRAOCULAR LENS PLACEMENT (IOC) RIGHT 7.22 01:22.1;  Surgeon: Mittie Gaskin, MD;  Location: [facility];  Service: Ophthalmology;  Laterality: Right;  sleep apnea   CESAREAN SECTION  1985   COLONOSCOPY     DILATION AND CURETTAGE OF UTERUS  2005   HAND SURGERY  2000   nodule removed   KNEE SURGERY  1970's   right   NASAL SINUS SURGERY  2000's   right maxillary   TONSILLECTOMY AND ADENOIDECTOMY     TUBAL LIGATION  May 1985    OB History   No obstetric history on file.      Home Medications    Prior to  Admission medications   Medication Sig Start Date End Date Taking? Authorizing Provider  Ascorbic Acid (VITAMIN C) 1000 MG tablet Take 1,000 mg by mouth daily.    [provider]  cetirizine (ZYRTEC) 10 MG tablet Take 10 mg by mouth at bedtime.    [provider]  Cholecalciferol (VITAMIN D -3 PO) Take 1 tablet by mouth daily.    [provider]  Cyanocobalamin  (B-12 PO) Take by mouth daily.    [provider]  Flaxseed, Linseed, (FLAXSEED OIL) 1000 MG CAPS Take 1,000 mg by mouth daily at 12 noon.    [provider]  FLUoxetine  (PROZAC ) 20 MG capsule Take 2 capsules (40 mg total) by mouth daily. 11/10/23   Tower, Laine LABOR, MD  fluticasone  (FLONASE ) 50 MCG/ACT nasal spray Place 2 sprays into both nostrils daily. 11/10/23   Tower, Laine LABOR, MD  GLUCOMANNAN PO Take 600 mg by mouth 3 (three) times daily.    [provider]  metFORMIN  (GLUCOPHAGE ) 500 MG tablet Take 1 tablet (500 mg total) by mouth 2 (two) times daily with a meal. 06/24/24   Verdon, Caren D, MD  mometasone (ELOCON) 0.1 % cream  03/11/18   [provider]  Multiple Vitamins-Minerals (ANTIOXIDANT PO) Take by mouth daily at 12 noon. Dosage unknow    [provider]  omeprazole  (PRILOSEC) 20 MG capsule Take 1 capsule (20 mg total) by mouth daily. 11/10/23   Tower, Laine LABOR, MD  oxybutynin  (DITROPAN -XL) 10 MG 24 hr tablet Take 1 tablet (10 mg total) by mouth at bedtime. 11/10/23   Tower, Laine LABOR, MD  rosuvastatin  (CRESTOR ) 10 MG tablet Take 1 tablet (10 mg total) by mouth daily. 11/10/23   Tower, Laine LABOR, MD  topiramate  (TOPAMAX ) 50 MG tablet Take 1 tablet (50 mg total) by mouth at bedtime. 11/10/23   Tower, Laine LABOR, MD  Vitamin D , Ergocalciferol , (DRISDOL ) 1.25 MG (50000 UNIT) CAPS capsule Take 1 capsule (50,000 Units total) by mouth every 7 (seven) days. 07/13/24   Verdon Louann BIRCH, MD    Family History Family History  Problem Relation Age of Onset   Stroke Mother     Hyperlipidemia Mother    Hypertension Mother    Cancer Mother    COPD Mother    Hyperlipidemia Father    Hypertension Father    Coronary artery disease Father        in his 50's   Arthritis Father    Stroke Father    Heart disease Father        pacemaker   Prostate cancer Father    Breast cancer Maternal Grandmother    Cancer Maternal Grandmother    Breast cancer  Cousin        mat cousin   Colon cancer Other    Colon polyps Neg Hx     Social History Social History   Tobacco Use   Smoking status: Never   Smokeless tobacco: Never  Vaping Use   Vaping status: Never Used  Substance Use Topics   Alcohol use: Not Currently    Comment: occasional   Drug use: No     Allergies   Atorvastatin, Fluogen [influenza virus vaccine], and Haemophilus b polysaccharide vaccine   Review of Systems Review of Systems  Constitutional:  Negative for chills and fever.  Musculoskeletal:  Positive for arthralgias and joint swelling. Negative for gait problem.  Skin:  Positive for color change. Negative for rash and wound.  Neurological:  Negative for weakness and numbness.     Physical Exam Triage Vital Signs ED Triage Vitals  Encounter Vitals Group     BP 07/22/24 1538 125/85     Girls Systolic BP Percentile --      Girls Diastolic BP Percentile --      Boys Systolic BP Percentile --      Boys Diastolic BP Percentile --      Pulse Rate 07/22/24 1538 76     Resp 07/22/24 1538 18     Temp 07/22/24 1538 97.9 F (36.6 C)     Temp src --      SpO2 07/22/24 1538 98 %     Weight --      Height --      Head Circumference --      Peak Flow --      Pain Score 07/22/24 1533 4     Pain Loc --      Pain Education --      Exclude from Growth Chart --    No data found.  Updated Vital Signs BP 125/85   Pulse 76   Temp 97.9 F (36.6 C)   Resp 18   LMP 04/17/2011   SpO2 98%   Visual Acuity Right Eye Distance:   Left Eye Distance:   Bilateral Distance:    Right Eye Near:    Left Eye Near:    Bilateral Near:     Physical Exam Constitutional:      General: She is not in acute distress. HENT:     Mouth/Throat:     Mouth: Mucous membranes are moist.  Cardiovascular:     Rate and Rhythm: Normal rate and regular rhythm.  Pulmonary:     Effort: Pulmonary effort is normal. No respiratory distress.  Musculoskeletal:        General: Swelling and tenderness present. No deformity. Normal range of motion.       Feet:  Skin:    General: Skin is warm and dry.     Capillary Refill: Capillary refill takes less than 2 seconds.     Findings: Bruising present. No erythema, lesion or rash.  Neurological:     General: No focal deficit present.     Mental Status: She is alert.     Sensory: No sensory deficit.     Motor: No weakness.     Gait: Gait normal.      UC Treatments / Results  Labs (all labs ordered are listed, but only abnormal results are displayed) Labs Reviewed - No data to display  EKG   Radiology DG Foot Complete Right Result Date: 07/22/2024 CLINICAL DATA:  Pain, specially at base of second toe in ball  of foot. EXAM: RIGHT FOOT COMPLETE - 3+ VIEW COMPARISON:  None Available. FINDINGS: There is no evidence of fracture or dislocation. Minor degenerative spurring of the first metatarsal phalangeal joint. No second toe arthropathy. No erosions. There is a small plantar calcaneal spur and Achilles tendon enthesophyte. Soft tissue edema of the dorsum of the forefoot. IMPRESSION: 1. Soft tissue edema of the dorsum of the forefoot. No acute osseous abnormality. 2. Minor degenerative spurring of the first metatarsophalangeal joint. Electronically Signed   By: Andrea Gasman M.D.   On: 07/22/2024 16:26    Procedures Procedures (including critical care time)  Medications Ordered in UC Medications - No data to display  Initial Impression / Assessment and Plan / UC Course  I have reviewed the triage vital signs and the nursing notes.  Pertinent labs  & imaging results that were available during my care of the patient were reviewed by me and considered in my medical decision making (see chart for details).    Right foot pain.  Xray shows soft tissue edema but no acute bony abnormality.  Treating with rest, elevation, ice packs, ibuprofen, postop shoe.  Instructed her to follow-up with a podiatrist.  Contact information for on-call podiatrist provided.  Education provided on foot pain.  She agrees to plan care.  Final Clinical Impressions(s) / UC Diagnoses   Final diagnoses:  Right foot pain     Discharge Instructions      Your xray is pending.    Take ibuprofen as prescribed.  Rest and elevate your foot.  Apply ice packs 2-3 times a day for up to 20 minutes each.  Wear the postop shoe as directed.    Follow up with podiatrist such as the one listed below.        ED Prescriptions   None    PDMP not reviewed this encounter.   Corlis Burnard DEL, NP 07/22/24 1736

## 2024-07-22 NOTE — ED Triage Notes (Signed)
 Patient to Urgent Care with complaints of right sided foot pain. Describes pain as located at the ball of her foot and her second toe (swollen and sore). Possibly stepped off something wrong.   Symptoms x4 days.   Taking ibuprofen w/ little relief.

## 2024-07-22 NOTE — Discharge Instructions (Addendum)
 Your xray is pending.    Take ibuprofen as prescribed.  Rest and elevate your foot.  Apply ice packs 2-3 times a day for up to 20 minutes each.  Wear the postop shoe as directed.    Follow up with podiatrist such as the one listed below.

## 2024-07-23 ENCOUNTER — Ambulatory Visit (HOSPITAL_COMMUNITY): Payer: Self-pay

## 2024-07-30 DIAGNOSIS — M79671 Pain in right foot: Secondary | ICD-10-CM | POA: Diagnosis not present

## 2024-07-30 DIAGNOSIS — M7741 Metatarsalgia, right foot: Secondary | ICD-10-CM | POA: Diagnosis not present

## 2024-07-30 DIAGNOSIS — M25871 Other specified joint disorders, right ankle and foot: Secondary | ICD-10-CM | POA: Diagnosis not present

## 2024-08-02 DIAGNOSIS — H40003 Preglaucoma, unspecified, bilateral: Secondary | ICD-10-CM | POA: Diagnosis not present

## 2024-08-02 DIAGNOSIS — H43812 Vitreous degeneration, left eye: Secondary | ICD-10-CM | POA: Diagnosis not present

## 2024-08-11 ENCOUNTER — Ambulatory Visit (INDEPENDENT_AMBULATORY_CARE_PROVIDER_SITE_OTHER): Admitting: Family Medicine

## 2024-08-14 ENCOUNTER — Other Ambulatory Visit (INDEPENDENT_AMBULATORY_CARE_PROVIDER_SITE_OTHER): Payer: Self-pay | Admitting: Family Medicine

## 2024-08-23 DIAGNOSIS — M7741 Metatarsalgia, right foot: Secondary | ICD-10-CM | POA: Diagnosis not present

## 2024-08-23 DIAGNOSIS — M79671 Pain in right foot: Secondary | ICD-10-CM | POA: Diagnosis not present

## 2024-08-23 DIAGNOSIS — M25871 Other specified joint disorders, right ankle and foot: Secondary | ICD-10-CM | POA: Diagnosis not present

## 2024-08-30 DIAGNOSIS — L2084 Intrinsic (allergic) eczema: Secondary | ICD-10-CM | POA: Diagnosis not present

## 2024-08-30 DIAGNOSIS — J302 Other seasonal allergic rhinitis: Secondary | ICD-10-CM | POA: Diagnosis not present

## 2024-08-31 ENCOUNTER — Ambulatory Visit (INDEPENDENT_AMBULATORY_CARE_PROVIDER_SITE_OTHER)

## 2024-08-31 DIAGNOSIS — Z23 Encounter for immunization: Secondary | ICD-10-CM | POA: Diagnosis not present

## 2024-08-31 NOTE — Progress Notes (Signed)
 Per orders of Dr. Laine Balls, injection of regular flu shot given by Laray Arenas at pts request in right deltoid. Patient tolerated injection well.

## 2024-09-13 ENCOUNTER — Ambulatory Visit (INDEPENDENT_AMBULATORY_CARE_PROVIDER_SITE_OTHER): Admitting: Family Medicine

## 2024-09-13 ENCOUNTER — Encounter (INDEPENDENT_AMBULATORY_CARE_PROVIDER_SITE_OTHER): Payer: Self-pay | Admitting: Family Medicine

## 2024-09-13 VITALS — BP 99/66 | HR 78 | Temp 98.7°F | Ht 66.0 in | Wt 195.0 lb

## 2024-09-13 DIAGNOSIS — E88819 Insulin resistance, unspecified: Secondary | ICD-10-CM

## 2024-09-13 DIAGNOSIS — Z6831 Body mass index (BMI) 31.0-31.9, adult: Secondary | ICD-10-CM | POA: Diagnosis not present

## 2024-09-13 DIAGNOSIS — E669 Obesity, unspecified: Secondary | ICD-10-CM | POA: Diagnosis not present

## 2024-09-13 DIAGNOSIS — E66811 Obesity, class 1: Secondary | ICD-10-CM

## 2024-09-13 DIAGNOSIS — E559 Vitamin D deficiency, unspecified: Secondary | ICD-10-CM | POA: Diagnosis not present

## 2024-09-13 MED ORDER — VITAMIN D (ERGOCALCIFEROL) 1.25 MG (50000 UNIT) PO CAPS
50000.0000 [IU] | ORAL_CAPSULE | ORAL | 0 refills | Status: DC
Start: 2024-09-13 — End: 2024-10-19

## 2024-09-13 MED ORDER — METFORMIN HCL 500 MG PO TABS
500.0000 mg | ORAL_TABLET | Freq: Two times a day (BID) | ORAL | 0 refills | Status: DC
Start: 2024-09-13 — End: 2024-10-19

## 2024-09-13 NOTE — Progress Notes (Signed)
 Office: [phone]  /  Fax: [phone]  WEIGHT SUMMARY AND BIOMETRICS  Anthropometric Measurements Height: 5' 6 (1.676 m) Weight: 195 lb (88.5 kg) BMI (Calculated): 31.49 Weight at Last Visit: 194 lb Weight Lost Since Last Visit: 0 Weight Gained Since Last Visit: 1 lb Starting Weight: 206 lb Total Weight Loss (lbs): 11 lb (4.99 kg) Peak Weight: 220 lb   Body Composition  Body Fat %: 43.7 % Fat Mass (lbs): 85.4 lbs Muscle Mass (lbs): 104.4 lbs Total Body Water (lbs): 73.2 lbs Visceral Fat Rating : 12   Other Clinical Data Fasting: yes Labs: no Today's Visit #: 7 Starting Date: 03/23/24    Chief Complaint: OBESITY    History of Present Illness Pamela Anthony is a 68 year old female with obesity and insulin  resistance who presents for obesity treatment and progress assessment.  She has been following a category two eating plan about fifty percent of the time and has gained one pound in the last two months. She is working on increasing her protein intake and consuming more fruits and vegetables, while also trying to hydrate adequately and avoid skipping meals. She gets seven to nine hours of sleep per night. She is considering different dietary approaches to manage her weight, including counting macros or calories, as she finds structured plans can become monotonous. She has not kept a food diary in September but has been mindful of her eating habits.  She has been unable to exercise due to a right foot injury, specifically a plantar plate issue affecting the second toe next to her big toe. 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 She wraps her foot, uses metatarsal pads, and wears [employer] shoes, which she finds comfortable despite their lack of fashion appeal. She has been living in these shoes for the last four to five weeks.  She is currently being treated for insulin  resistance with metformin  500 mg twice a day. Her most recent hemoglobin A1c in  April was 5.4. She is also being treated for vitamin D  deficiency with 50,000 IU of ergocalciferol  weekly. Her vitamin D  level in April was 40.8. She has difficulty with anti-inflammatory medications prescribed for her foot pain. Meloxicam  at a higher dose than previously taken caused stomach issues, and Celebrex resulted in excessive gas. She uses ibuprofen as needed for pain management.  She is retiring in fifteen work days and will have more flexibility in her schedule.      PHYSICAL EXAM:  Blood pressure 99/66, pulse 78, temperature 98.7 F (37.1 C), height 5' 6 (1.676 m), weight 195 lb (88.5 kg), last menstrual period 04/17/2011, SpO2 98%. Body mass index is 31.47 kg/m.  DIAGNOSTIC DATA REVIEWED:  BMET    Component Value Date/Time   NA 139 06/02/2024 1005   NA 142 03/23/2024 0833   K 3.9 06/02/2024 1005   CL 108 06/02/2024 1005   CO2 24 06/02/2024 1005   GLUCOSE 95 06/02/2024 1005   BUN 20 06/02/2024 1005   BUN 21 03/23/2024 0833   CREATININE 0.59 06/02/2024 1005   CREATININE 0.63 10/31/2021 1452   CALCIUM  8.6 06/02/2024 1005   GFRNONAA 93.04 02/28/2010 1214   GFRAA 113 06/21/2008 1405   Lab Results  Component Value Date   HGBA1C 5.4 03/23/2024   HGBA1C 5.5 09/14/2015   Lab Results  Component Value Date   INSULIN  15.7 03/23/2024   Lab Results  Component Value Date   TSH 1.90 06/02/2024   CBC    Component  Value Date/Time   WBC 4.6 03/23/2024 0833   WBC 4.6 10/28/2023 0853   RBC 4.41 03/23/2024 0833   RBC 4.33 10/28/2023 0853   HGB 13.2 03/23/2024 0833   HCT 40.2 03/23/2024 0833   PLT 213 03/23/2024 0833   MCV 91 03/23/2024 0833   MCH 29.9 03/23/2024 0833   MCH 29.3 10/31/2021 1452   MCHC 32.8 03/23/2024 0833   MCHC 33.5 10/28/2023 0853   RDW 13.0 03/23/2024 0833   Iron Studies    Component Value Date/Time   IRON 72 09/05/2022 1147   FERRITIN 4.4 (L) 09/05/2008 1025   IRONPCTSAT 8.4 (L) 06/21/2008 1405   Lipid Panel     Component Value  Date/Time   CHOL 152 03/23/2024 0833   TRIG 136 03/23/2024 0833   HDL 47 03/23/2024 0833   CHOLHDL 3.2 03/23/2024 0833   CHOLHDL 3 10/28/2023 0853   VLDL 21.2 10/28/2023 0853   LDLCALC 81 03/23/2024 0833   LDLCALC 59 10/31/2021 1452   LDLDIRECT 182.2 08/25/2013 0814   Hepatic Function Panel     Component Value Date/Time   PROT 6.5 06/02/2024 1005   PROT 6.7 03/23/2024 0833   ALBUMIN 4.2 06/02/2024 1005   ALBUMIN 4.6 03/23/2024 0833   AST 14 06/02/2024 1005   ALT 11 06/02/2024 1005   ALKPHOS 52 06/02/2024 1005   BILITOT 0.5 06/02/2024 1005   BILITOT 0.5 03/23/2024 0833   BILIDIR 0.1 06/02/2024 1005      Component Value Date/Time   TSH 1.90 06/02/2024 1005   Nutritional Lab Results  Component Value Date   VD25OH 40.8 03/23/2024   VD25OH 53.97 10/28/2023   VD25OH 66.00 10/28/2022     Assessment and Plan Assessment & Plan Obesity and insulin  resistance, with a recent weight gain of one pound over the last two months. Limited adherence to the prescribed category two eating plan, approximately 50% adherence. Inability to exercise due to a right foot injury. Current treatment includes metformin  500 mg twice daily. Discussion on dietary options to aid weight loss, including structured plans and journaling with calorie and protein targets. Emphasis on maintaining protein intake from real food sources and avoiding very low-calorie intake to prevent metabolic slowdown. Encouraged to be creative in meal planning to prevent boredom and maintain adherence. - Refill metformin  500 mg twice daily - Encourage adherence to a structured eating plan or journaling with a calorie range of 1100-1400 and a protein intake of at least 85 grams per day - Advise on incorporating protein-rich foods such as lean meats, dairy, and specific recipes like protein smoothies and [place] cheesy chicken - Encourage creativity in meal planning to prevent boredom and maintain adherence - Plan for fasting labs at  the next appointment to reassess metabolic parameters  Vitamin D  deficiency Vitamin D  deficiency with a recent level of 40.8 ng/mL, below the target range of 50-60 ng/mL. Current treatment includes ergocalciferol  50,000 IU weekly. - Refill ergocalciferol  50,000 IU weekly - Plan for fasting labs at the next appointment to reassess vitamin D  levels     Asako was informed of the importance of frequent follow up visits to maximize her success with intensive lifestyle modifications for her obesity and obesity related health conditions as recommended by USPSTF and CMS guidelines   Louann Penton, MD

## 2024-09-21 ENCOUNTER — Other Ambulatory Visit: Payer: Self-pay | Admitting: Family Medicine

## 2024-09-21 DIAGNOSIS — Z1231 Encounter for screening mammogram for malignant neoplasm of breast: Secondary | ICD-10-CM

## 2024-09-22 DIAGNOSIS — L814 Other melanin hyperpigmentation: Secondary | ICD-10-CM | POA: Diagnosis not present

## 2024-09-22 DIAGNOSIS — L821 Other seborrheic keratosis: Secondary | ICD-10-CM | POA: Diagnosis not present

## 2024-09-22 DIAGNOSIS — D485 Neoplasm of uncertain behavior of skin: Secondary | ICD-10-CM | POA: Diagnosis not present

## 2024-09-22 DIAGNOSIS — D1801 Hemangioma of skin and subcutaneous tissue: Secondary | ICD-10-CM | POA: Diagnosis not present

## 2024-09-22 DIAGNOSIS — Z85828 Personal history of other malignant neoplasm of skin: Secondary | ICD-10-CM | POA: Diagnosis not present

## 2024-10-19 ENCOUNTER — Encounter (INDEPENDENT_AMBULATORY_CARE_PROVIDER_SITE_OTHER): Payer: Self-pay | Admitting: Family Medicine

## 2024-10-19 ENCOUNTER — Ambulatory Visit (INDEPENDENT_AMBULATORY_CARE_PROVIDER_SITE_OTHER): Payer: Self-pay | Admitting: Family Medicine

## 2024-10-19 VITALS — BP 108/74 | HR 78 | Temp 98.2°F | Ht 66.0 in | Wt 197.0 lb

## 2024-10-19 DIAGNOSIS — E7800 Pure hypercholesterolemia, unspecified: Secondary | ICD-10-CM

## 2024-10-19 DIAGNOSIS — Z6831 Body mass index (BMI) 31.0-31.9, adult: Secondary | ICD-10-CM | POA: Diagnosis not present

## 2024-10-19 DIAGNOSIS — E669 Obesity, unspecified: Secondary | ICD-10-CM | POA: Diagnosis not present

## 2024-10-19 DIAGNOSIS — E785 Hyperlipidemia, unspecified: Secondary | ICD-10-CM

## 2024-10-19 DIAGNOSIS — E559 Vitamin D deficiency, unspecified: Secondary | ICD-10-CM

## 2024-10-19 DIAGNOSIS — E88819 Insulin resistance, unspecified: Secondary | ICD-10-CM | POA: Diagnosis not present

## 2024-10-19 MED ORDER — METFORMIN HCL 500 MG PO TABS: 500.0000 mg | ORAL_TABLET | Freq: Two times a day (BID) | ORAL | 0 refills | Status: DC

## 2024-10-19 MED ORDER — VITAMIN D (ERGOCALCIFEROL) 1.25 MG (50000 UNIT) PO CAPS
50000.0000 [IU] | ORAL_CAPSULE | ORAL | 0 refills | Status: AC
Start: 2024-10-19 — End: ?

## 2024-10-19 NOTE — Progress Notes (Signed)
 Office: [phone]  /  Fax: [phone]  WEIGHT SUMMARY AND BIOMETRICS  Anthropometric Measurements Height: 5' 6 (1.676 m) Weight: 197 lb (89.4 kg) BMI (Calculated): 31.81 Weight at Last Visit: 195 lb Weight Lost Since Last Visit: 0 Weight Gained Since Last Visit: 2 lb Starting Weight: 206 lb Total Weight Loss (lbs): 9 lb (4.082 kg) Peak Weight: 220 lb   Body Composition  Body Fat %: 44.9 % Fat Mass (lbs): 88.8 lbs Muscle Mass (lbs): 103.4 lbs Total Body Water (lbs): 74.8 lbs Visceral Fat Rating : 13   Other Clinical Data Fasting: yes Labs: yes Today's Visit #: 8 Starting Date: 03/23/24    Chief Complaint: OBESITY    History of Present Illness Pamela Anthony is a 68 year old female who presents for obesity treatment and progress assessment.  She has been adhering to the category two eating plan approximately fifty percent of the time and engages in walking for exercise five days a week, thirty minutes per session. Despite these efforts, she has gained two pounds in the last month since her last visit.  She is currently being treated for vitamin D  deficiency with prescription ergocalciferol , fifty thousand international units weekly, and requests a refill. Additionally, she is on metformin , five hundred milligrams twice a day, for prediabetes and requests a refill for this medication as well. No problems with metformin  or vitamin D  supplementation are reported. She is not on thyroid  medication.  Her current medications also include Crestor  for hyperlipidemia. Her cholesterol levels were very good at the last check, but it has been six months since the last test.  Socially, she has recently retired and is adjusting to a new routine. 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 She is also preparing for holiday  challenges, particularly with sweets during Christmas, and plans to increase her walking to manage potential weight gain.      PHYSICAL EXAM:  Blood pressure 108/74, pulse 78, temperature 98.2 F (36.8 C), height 5' 6 (1.676 m), weight 197 lb (89.4 kg), last menstrual period 04/17/2011, SpO2 98%. Body mass index is 31.8 kg/m.  DIAGNOSTIC DATA REVIEWED:  BMET    Component Value Date/Time   NA 139 06/02/2024 1005   NA 142 03/23/2024 0833   K 3.9 06/02/2024 1005   CL 108 06/02/2024 1005   CO2 24 06/02/2024 1005   GLUCOSE 95 06/02/2024 1005   BUN 20 06/02/2024 1005   BUN 21 03/23/2024 0833   CREATININE 0.59 06/02/2024 1005   CREATININE 0.63 10/31/2021 1452   CALCIUM  8.6 06/02/2024 1005   GFRNONAA 93.04 02/28/2010 1214   GFRAA 113 06/21/2008 1405   Lab Results  Component Value Date   HGBA1C 5.4 03/23/2024   HGBA1C 5.5 09/14/2015   Lab Results  Component Value Date   INSULIN  15.7 03/23/2024   Lab Results  Component Value Date   TSH 1.90 06/02/2024   CBC    Component Value Date/Time   WBC 4.6 03/23/2024 0833   WBC 4.6 10/28/2023 0853   RBC 4.41 03/23/2024 0833   RBC 4.33 10/28/2023 0853   HGB 13.2 03/23/2024 0833   HCT 40.2 03/23/2024 0833   PLT 213 03/23/2024 0833   MCV 91 03/23/2024 0833   MCH 29.9 03/23/2024 0833   MCH 29.3 10/31/2021 1452   MCHC 32.8 03/23/2024 0833   MCHC 33.5 10/28/2023  0853   RDW 13.0 03/23/2024 0833   Iron Studies    Component Value Date/Time   IRON 72 09/05/2022 1147   FERRITIN 4.4 (L) 09/05/2008 1025   IRONPCTSAT 8.4 (L) 06/21/2008 1405   Lipid Panel     Component Value Date/Time   CHOL 152 03/23/2024 0833   TRIG 136 03/23/2024 0833   HDL 47 03/23/2024 0833   CHOLHDL 3.2 03/23/2024 0833   CHOLHDL 3 10/28/2023 0853   VLDL 21.2 10/28/2023 0853   LDLCALC 81 03/23/2024 0833   LDLCALC 59 10/31/2021 1452   LDLDIRECT 182.2 08/25/2013 0814   Hepatic Function Panel     Component Value Date/Time   PROT 6.5 06/02/2024 1005    PROT 6.7 03/23/2024 0833   ALBUMIN 4.2 06/02/2024 1005   ALBUMIN 4.6 03/23/2024 0833   AST 14 06/02/2024 1005   ALT 11 06/02/2024 1005   ALKPHOS 52 06/02/2024 1005   BILITOT 0.5 06/02/2024 1005   BILITOT 0.5 03/23/2024 0833   BILIDIR 0.1 06/02/2024 1005      Component Value Date/Time   TSH 1.90 06/02/2024 1005   Nutritional Lab Results  Component Value Date   VD25OH 40.8 03/23/2024   VD25OH 53.97 10/28/2023   VD25OH 66.00 10/28/2022     Assessment and Plan Assessment & Plan Obesity Management is ongoing with a category two eating plan, followed 50% of the time. She engages in walking exercise five days a week for 30 minutes. Recent weight gain of 2 pounds in the last month. Discussed holiday strategies to manage weight gain, focusing on identifying potential challenges and implementing damage control strategies. - Continue walking exercise five days a week for 30 minutes - Implement holiday strategies to manage weight gain  Insulin  resistance Managed with metformin  500 mg twice a day. No reported issues with the medication. - Continue metformin  500 mg twice a day - Ordered labs to check A1c and insulin  levels  Vitamin D  deficiency Managed with prescription ergocalciferol  50,000 IU weekly. No reported issues with the medication. - Continue ergocalciferol  50,000 IU weekly - Ordered labs to check vitamin D  levels  Hyperlipidemia Managed with Crestor , diet, exercise, and weight loss. Cholesterol levels were very good six months ago. Labs are due for thoroughness. - Continue Crestor  - Ordered labs to check cholesterol levels - Ordered labs to check liver function     Tateanna was counseled on the importance of maintaining healthy lifestyle habits, including balanced nutrition, regular physical activity, and behavioral modifications, while taking antiobesity medication.  Patient verbalized understanding that medication is an adjunct to, not a replacement for, lifestyle  changes and that the long-term success and weight maintenance depend on continued adherence to these strategies.   Lexxus was informed of the importance of frequent follow up visits to maximize her success with intensive lifestyle modifications for her obesity and obesity related health conditions as recommended by USPSTF and CMS guidelines   Louann Penton, MD

## 2024-10-21 LAB — LIPID PANEL WITH LDL/HDL RATIO
Cholesterol, Total: 132 mg/dL (ref 100–199)
HDL: 48 mg/dL (ref >39)
LDL Chol Calc (NIH): 68 mg/dL (ref 0–99)
LDL/HDL Ratio: 1.4 ratio (ref 0.0–3.2)
Triglycerides: 82 mg/dL (ref 0–149)
VLDL Cholesterol Cal: 16 mg/dL (ref 5–40)

## 2024-10-21 LAB — CMP14+EGFR
ALT: 12 IU/L (ref 0–32)
AST: 18 IU/L (ref 0–40)
Albumin: 4.5 g/dL (ref 3.9–4.9)
Alkaline Phosphatase: 65 IU/L (ref 49–135)
BUN/Creatinine Ratio: 30 — ABNORMAL HIGH (ref 12–28)
BUN: 20 mg/dL (ref 8–27)
Bilirubin Total: 0.5 mg/dL (ref 0.0–1.2)
CO2: 22 mmol/L (ref 20–29)
Calcium: 8.8 mg/dL (ref 8.7–10.3)
Chloride: 106 mmol/L (ref 96–106)
Creatinine, Ser: 0.67 mg/dL (ref 0.57–1.00)
Globulin, Total: 2 g/dL (ref 1.5–4.5)
Glucose: 85 mg/dL (ref 70–99)
Potassium: 4.2 mmol/L (ref 3.5–5.2)
Sodium: 144 mmol/L (ref 134–144)
Total Protein: 6.5 g/dL (ref 6.0–8.5)
eGFR: 96 mL/min/1.73 (ref >59)

## 2024-10-21 LAB — HEMOGLOBIN A1C
Est. average glucose Bld gHb Est-mCnc: 103 mg/dL
Hgb A1c MFr Bld: 5.2 % (ref 4.8–5.6)

## 2024-10-21 LAB — INSULIN, RANDOM: INSULIN: 8.4 u[IU]/mL (ref 2.6–24.9)

## 2024-10-21 LAB — VITAMIN D 25 HYDROXY (VIT D DEFICIENCY, FRACTURES): Vit D, 25-Hydroxy: 58.3 ng/mL (ref 30.0–100.0)

## 2024-10-25 DIAGNOSIS — M7662 Achilles tendinitis, left leg: Secondary | ICD-10-CM | POA: Diagnosis not present

## 2024-10-25 DIAGNOSIS — M79671 Pain in right foot: Secondary | ICD-10-CM | POA: Diagnosis not present

## 2024-10-25 DIAGNOSIS — M7741 Metatarsalgia, right foot: Secondary | ICD-10-CM | POA: Diagnosis not present

## 2024-10-25 DIAGNOSIS — M25871 Other specified joint disorders, right ankle and foot: Secondary | ICD-10-CM | POA: Diagnosis not present

## 2024-11-02 ENCOUNTER — Telehealth: Payer: Self-pay | Admitting: *Deleted

## 2024-11-02 ENCOUNTER — Ambulatory Visit

## 2024-11-02 VITALS — BP 118/70 | Ht 66.0 in | Wt 198.0 lb

## 2024-11-02 DIAGNOSIS — Z0000 Encounter for general adult medical examination without abnormal findings: Secondary | ICD-10-CM

## 2024-11-02 MED ORDER — KETOCONAZOLE 2 % EX CREA: TOPICAL_CREAM | Freq: Every day | CUTANEOUS | 0 refills | Status: DC

## 2024-11-02 NOTE — Patient Instructions (Signed)
 Pamela Anthony,  Thank you for taking the time for your Medicare Wellness Visit. I appreciate your continued commitment to your health goals. Please review the care plan we discussed, and feel free to reach out if I can assist you further.  Please note that Annual Wellness Visits do not include a physical exam. Some assessments may be limited, especially if the visit was conducted virtually. If needed, we may recommend an in-person follow-up with your provider.  Ongoing Care Seeing your primary care provider every 3 to 6 months helps us  monitor your health and provide consistent, personalized care.   Referrals If a referral was made during today's visit and you haven't received any updates within two weeks, please contact the referred provider directly to check on the status.  Recommended Screenings:  Health Maintenance  Topic Date Due   COVID-19 Vaccine (6 - 2025-26 season) 08/09/2024   Breast Cancer Screening  11/04/2024   Osteoporosis screening with Bone Density Scan  11/07/2024   Medicare Annual Wellness Visit  11/09/2024   Colon Cancer Screening  04/20/2028   DTaP/Tdap/Td vaccine (4 - Td or Tdap) 06/02/2034   Pneumococcal Vaccine for age over 32  Completed   Flu Shot  Completed   Hepatitis C Screening  Completed   Zoster (Shingles) Vaccine  Completed   Meningitis B Vaccine  Aged Out       10/30/2024   12:41 PM  Advanced Directives  Does Patient Have a Medical Advance Directive? No  Would patient like information on creating a medical advance directive? No - Patient declined    Vision: Annual vision screenings are recommended for early detection of glaucoma, cataracts, and diabetic retinopathy. These exams can also reveal signs of chronic conditions such as diabetes and high blood pressure.  Dental: Annual dental screenings help detect early signs of oral cancer, gum disease, and other conditions linked to overall health, including heart disease and diabetes.  Please see the  attached documents for additional preventive care recommendations.

## 2024-11-02 NOTE — Telephone Encounter (Signed)
Pt notified of Dr. Tower's comments and verbalized understanding  

## 2024-11-02 NOTE — Telephone Encounter (Signed)
 Copied from CRM #[id number]. Topic: Clinical - Medication Question >> Nov 02, 2024  9:58 AM Burnard DEL wrote: Reason for CRM: Patient called in stating that she usually gets a rash in the fold of her stomach when she gets hot. She stated that she was prescribed an antifungal before but can't remember the name of it.She would like to know if she could be prescribed another cream for the rash?  [id number] [place], [place] - [address]  Phone: [phone] Fax: [phone]

## 2024-11-02 NOTE — Progress Notes (Signed)
 I connected with  Pamela Anthony on 11/02/24 by a audio enabled telemedicine application and verified that I am speaking with the correct person using two identifiers.  Patient Location: Home  Provider Location: Home Office  Persons Participating in Visit: Patient.  I discussed the limitations of evaluation and management by telemedicine. The patient expressed understanding and agreed to proceed.  Vital Signs: Because this visit was a virtual/telehealth visit, some criteria may be missing or patient reported. Any vitals not documented were not able to be obtained and vitals that have been documented are patient reported.   Because this visit was a virtual/telehealth visit,  certain criteria was not obtained, such a blood pressure, CBG if applicable, and timed get up and go. Any medications not marked as taking were not mentioned during the medication reconciliation part of the visit. Any vitals not documented were not able to be obtained due to this being a telehealth visit or patient was unable to self-report a recent blood pressure reading due to a lack of equipment at home via telehealth. Vitals that have been documented are verbally provided by the patient.  This visit was performed by a medical professional under my direct supervision. I was immediately available for consultation/collaboration. I have reviewed and agree with the Annual Wellness Visit documentation.  Chief Complaint  Patient presents with   Medicare Wellness     Subjective:   Pamela Anthony is a 68 y.o. female who presents for a Medicare Annual Wellness Visit.  Allergies (verified) Atorvastatin, Fluogen [influenza virus vaccine], and Haemophilus b polysaccharide vaccine   History: Past Medical History:  Diagnosis Date   Allergic rhinitis    Allergy    Anxiety    Arthritis    B12 deficiency    Bilateral swelling of feet    legs   Constipation    Degenerative disc disease    back, knee   Family history of  adverse reaction to anesthesia    Fibromyalgia    Fungal infection 2005   lamasil    GERD (gastroesophageal reflux disease)    EGD 10/2004   Heart burn    Hemorrhoids, internal    colonoscopy 02/2008   Hyperlipemia    Iron deficiency anemia    Low back pain    Migraine headache    improved with medications   Obesity    Orthodontics    InvisAlign   Sleep apnea    no cpap   Swallowing difficulty    Urinary incontinence    Vaso-vagal reaction    occured after eye drops for cataract surgery, but pt reports this has happened before during medical procedures   Vitamin D  deficiency    mild   Past Surgical History:  Procedure Laterality Date   CATARACT EXTRACTION W/PHACO Left 03/25/2022   Procedure: CATARACT EXTRACTION PHACO AND INTRAOCULAR LENS PLACEMENT (IOC) LEFT;  Surgeon: Mittie Gaskin, MD;  Location: [facility];  Service: Ophthalmology;  Laterality: Left;  8.88 1:13.0   CATARACT EXTRACTION W/PHACO Right 04/17/2022   Procedure: CATARACT EXTRACTION PHACO AND INTRAOCULAR LENS PLACEMENT (IOC) RIGHT 7.22 01:22.1;  Surgeon: Mittie Gaskin, MD;  Location: [facility];  Service: Ophthalmology;  Laterality: Right;  sleep apnea   CESAREAN SECTION  1985   COLONOSCOPY     DILATION AND CURETTAGE OF UTERUS  2005   HAND SURGERY  2000   nodule removed   KNEE SURGERY  1970's   right   NASAL SINUS SURGERY  2000's   right maxillary  TONSILLECTOMY AND ADENOIDECTOMY     TUBAL LIGATION  May 1985   Family History  Problem Relation Age of Onset   Stroke Mother    Hyperlipidemia Mother    Hypertension Mother    Cancer Mother    COPD Mother    Hyperlipidemia Father    Hypertension Father    Coronary artery disease Father        in his 45's   Arthritis Father    Stroke Father    Heart disease Father        pacemaker   Prostate cancer Father    Breast cancer Maternal Grandmother    Cancer Maternal Grandmother    Breast cancer Cousin        mat cousin    Colon cancer Other    Colon polyps Neg Hx    Social History   Occupational History   Occupation: customer service  Tobacco Use   Smoking status: Never   Smokeless tobacco: Never  Vaping Use   Vaping status: Never Used  Substance and Sexual Activity   Alcohol use: Not Currently    Comment: occasional   Drug use: No   Sexual activity: Not Currently    Birth control/protection: Post-menopausal   Tobacco Counseling Counseling given: Not Answered  SDOH Screenings   Food Insecurity: No Food Insecurity (10/30/2024)  Housing: Low Risk  (10/30/2024)  Transportation Needs: No Transportation Needs (10/30/2024)  Utilities: Not At Risk (07/30/2024)   Received from [facility]  Alcohol Screen: Low Risk  (10/30/2024)  Depression (PHQ2-9): Low Risk  (11/02/2024)  Financial Resource Strain: Low Risk  (10/30/2024)  Physical Activity: Insufficiently Active (10/30/2024)  Social Connections: Moderately Isolated (10/30/2024)  Stress: No Stress Concern Present (10/30/2024)  Tobacco Use: Low Risk  (11/02/2024)  Health Literacy: Adequate Health Literacy (11/02/2024)   See flowsheets for full screening details  Depression Screen PHQ 2 & 9 Depression Scale- Over the past 2 weeks, how often have you been bothered by any of the following problems? Little interest or pleasure in doing things: 0 Feeling down, depressed, or hopeless (PHQ Adolescent also includes...irritable): 0 PHQ-2 Total Score: 0 Trouble falling or staying asleep, or sleeping too much: 0 Feeling tired or having little energy: 0 Poor appetite or overeating (PHQ Adolescent also includes...weight loss): 0 Feeling bad about yourself - or that you are a failure or have let yourself or your family down: 0 Trouble concentrating on things, such as reading the newspaper or watching television (PHQ Adolescent also includes...like school work): 0 Moving or speaking so slowly that other people could have noticed. Or the  opposite - being so fidgety or restless that you have been moving around a lot more than usual: 0 Thoughts that you would be better off dead, or of hurting yourself in some way: 0 PHQ-9 Total Score: 0 If you checked off any problems, how difficult have these problems made it for you to do your work, take care of things at home, or get along with other people?: Not difficult at all  Depression Treatment Depression Interventions/Treatment : EYV7-0 Score <4 Follow-up Not Indicated     Goals Addressed               This Visit's Progress     Patient Stated (pt-stated)        To lose some weight       Visit info / Clinical Intake: Medicare Wellness Visit Type:: Subsequent Annual Wellness Visit Persons participating in visit:: patient Medicare  Wellness Visit Mode:: Telephone If telephone:: video declined Because this visit was a virtual/telehealth visit:: pt reported vitals If Telephone or Video please confirm:: I discussed the limitations of evaluation and management by telemedicine; The patient expressed understanding and agreed to proceed; I connected with the patient using audio enabled telemedicine application and verified that I am speaking with the correct person using two identifiers Patient Location:: home Provider Location:: home office Information given by:: patient Interpreter Needed?: No Pre-visit prep was completed: yes AWV questionnaire completed by patient prior to visit?: yes Date:: 10/30/24 Living arrangements:: lives with spouse/significant other Patient's Overall Health Status Rating: very good Typical amount of pain: some Does pain affect daily life?: no Are you currently prescribed opioids?: no  Dietary Habits and Nutritional Risks How many meals a day?: 3 Eats fruit and vegetables daily?: yes Most meals are obtained by: preparing own meals; eating out In the last 2 weeks, have you had any of the following?: none  Functional Status Activities of Daily  Living (to include ambulation/medication): (Patient-Rptd) Independent Ambulation: (Patient-Rptd) Independent Medication Administration: Independent Home Management: (Patient-Rptd) Independent Manage your own finances?: yes Primary transportation is: driving Concerns about vision?: no *vision screening is required for WTM* Concerns about hearing?: no Uses hearing aids?: (!) yes  Fall Screening Falls in the past year?: (Patient-Rptd) 1 Number of falls in past year: (Patient-Rptd) 0 Was there an injury with Fall?: (Patient-Rptd) 0 Fall Risk Category Calculator: (Patient-Rptd) 1 Patient Fall Risk Level: (Patient-Rptd) Low Fall Risk  Fall Risk Patient at Risk for Falls Due to: History of fall(s) Fall risk Follow up: Falls evaluation completed; Falls prevention discussed  Home and Transportation Safety: All rugs have non-skid backing?: yes All stairs or steps have railings?: yes Grab bars in the bathtub or shower?: yes Have non-skid surface in bathtub or shower?: yes Good home lighting?: yes Regular seat belt use?: yes Hospital stays in the last year:: no  Cognitive Assessment Difficulty concentrating, remembering, or making decisions? : no Will 6CIT or Mini Cog be Completed: no 6CIT or Mini Cog Declined: patient alert, oriented, able to answer questions appropriately and recall recent events What year is it?: 0 points What month is it?: 0 points Give patient an address phrase to remember (5 components): [address] [place] About what time is it?: 0 points Count backwards from 20 to 1: 0 points Say the months of the year in reverse: 0 points Repeat the address phrase from earlier: 0 points 6 CIT Score: 0 points  Advance Directives (For Healthcare) Does Patient Have a Medical Advance Directive?: No Would patient like information on creating a medical advance directive?: No - Patient declined  Reviewed/Updated  Reviewed/Updated: Reviewed All (Medical, Surgical,  Family, Medications, Allergies, Care Teams, Patient Goals)        Objective:    Today's Vitals   11/02/24 0944  BP: 118/70  Weight: 198 lb (89.8 kg)  Height: 5' 6 (1.676 m)   Body mass index is 31.96 kg/m.  Current Medications (verified) Outpatient Encounter Medications as of 11/02/2024  Medication Sig   Ascorbic Acid (VITAMIN C) 1000 MG tablet Take 1,000 mg by mouth daily.   cetirizine (ZYRTEC) 10 MG tablet Take 10 mg by mouth at bedtime.   Cholecalciferol (VITAMIN D -3 PO) Take 1 tablet by mouth daily.   Cyanocobalamin  (B-12 PO) Take by mouth daily.   Flaxseed, Linseed, (FLAXSEED OIL) 1000 MG CAPS Take 1,000 mg by mouth daily at 12 noon.   FLUoxetine  (PROZAC ) 20 MG  capsule Take 2 capsules (40 mg total) by mouth daily.   fluticasone  (FLONASE ) 50 MCG/ACT nasal spray Place 2 sprays into both nostrils daily.   GLUCOMANNAN PO Take 600 mg by mouth 3 (three) times daily.   levocetirizine (XYZAL) 5 MG tablet Take 5 mg by mouth at bedtime.   metFORMIN  (GLUCOPHAGE ) 500 MG tablet Take 1 tablet (500 mg total) by mouth 2 (two) times daily with a meal.   mometasone (ELOCON) 0.1 % cream    mometasone (ELOCON) 0.1 % ointment Apply topically daily.   Multiple Vitamins-Minerals (ANTIOXIDANT PO) Take by mouth daily at 12 noon. Dosage unknow   omeprazole  (PRILOSEC) 20 MG capsule Take 1 capsule (20 mg total) by mouth daily.   oxybutynin  (DITROPAN -XL) 10 MG 24 hr tablet Take 1 tablet (10 mg total) by mouth at bedtime.   rosuvastatin  (CRESTOR ) 10 MG tablet Take 1 tablet (10 mg total) by mouth daily.   topiramate  (TOPAMAX ) 50 MG tablet Take 1 tablet (50 mg total) by mouth at bedtime.   Vitamin D , Ergocalciferol , (DRISDOL ) 1.25 MG (50000 UNIT) CAPS capsule Take 1 capsule (50,000 Units total) by mouth every 7 (seven) days.   No facility-administered encounter medications on file as of 11/02/2024.   Hearing/Vision screen Hearing Screening - Comments:: Wears hearing aids  Vision Screening -  Comments:: Patient wears readrs Immunizations and Health Maintenance Health Maintenance  Topic Date Due   COVID-19 Vaccine (6 - 2025-26 season) 08/09/2024   Mammogram  11/04/2024   Bone Density Scan  11/07/2024   Medicare Annual Wellness (AWV)  11/09/2024   Colonoscopy  04/20/2028   DTaP/Tdap/Td (4 - Td or Tdap) 06/02/2034   Pneumococcal Vaccine: 50+ Years  Completed   Influenza Vaccine  Completed   Hepatitis C Screening  Completed   Zoster Vaccines- Shingrix  Completed   Meningococcal B Vaccine  Aged Out        Assessment/Plan:  This is a routine wellness examination for Aamiyah.  Patient Care Team: Tower, Laine LABOR, MD as PCP - General Legrand Norris, [place] (Otolaryngology) Mittie Gaskin, MD as Referring Physician (Ophthalmology)  I have personally reviewed and noted the following in the patient's chart:   Medical and social history Use of alcohol, tobacco or illicit drugs  Current medications and supplements including opioid prescriptions. Functional ability and status Nutritional status Physical activity Advanced directives List of other physicians Hospitalizations, surgeries, and ER visits in previous 12 months Vitals Screenings to include cognitive, depression, and falls Referrals and appointments  No orders of the defined types were placed in this encounter.  In addition, I have reviewed and discussed with patient certain preventive protocols, quality metrics, and best practice recommendations. A written personalized care plan for preventive services as well as general preventive health recommendations were provided to patient.   Lyle MARLA Right, [place]   11/02/2024   No follow-ups on file.  After Visit Summary: (MyChart) Due to this being a telephonic visit, the after visit summary with patients personalized plan was offered to patient via MyChart   Nurse Notes: nothing to report

## 2024-11-02 NOTE — Telephone Encounter (Signed)
 Sent ketoconazole  cream to her pharmacy  Keep area very clean and dry  Follow up if not improved

## 2024-11-03 ENCOUNTER — Encounter: Payer: Self-pay | Admitting: Family Medicine

## 2024-11-03 MED ORDER — SUMATRIPTAN SUCCINATE 100 MG PO TABS: 100.0000 mg | ORAL_TABLET | Freq: Once | ORAL | 5 refills | Status: AC | PRN

## 2024-11-03 NOTE — Telephone Encounter (Signed)
 Will route to PCP since it's not on med list

## 2024-11-08 ENCOUNTER — Ambulatory Visit
Admission: RE | Admit: 2024-11-08 | Discharge: 2024-11-08 | Disposition: A | Source: Ambulatory Visit | Attending: Family Medicine | Admitting: Family Medicine

## 2024-11-08 DIAGNOSIS — Z1231 Encounter for screening mammogram for malignant neoplasm of breast: Secondary | ICD-10-CM | POA: Diagnosis not present

## 2024-11-10 ENCOUNTER — Telehealth: Payer: Self-pay | Admitting: Family Medicine

## 2024-11-10 DIAGNOSIS — R7303 Prediabetes: Secondary | ICD-10-CM

## 2024-11-10 DIAGNOSIS — R5382 Chronic fatigue, unspecified: Secondary | ICD-10-CM

## 2024-11-10 DIAGNOSIS — E538 Deficiency of other specified B group vitamins: Secondary | ICD-10-CM

## 2024-11-10 DIAGNOSIS — E559 Vitamin D deficiency, unspecified: Secondary | ICD-10-CM

## 2024-11-10 NOTE — Telephone Encounter (Signed)
-----   Message from Harlene Du sent at 11/02/2024  4:30 PM EST ----- Regarding: Labs Fri 11/12/24 Hello,  Patient is coming in for CPE labs. Can we get orders please.   Thanks

## 2024-11-10 NOTE — Telephone Encounter (Signed)
 Looks like she just had a bunch of [facility] Please cancel lab appointment -let her know   We will review and pick up any slack needed at her visit  Thanks

## 2024-11-11 ENCOUNTER — Ambulatory Visit: Payer: Self-pay | Admitting: Family Medicine

## 2024-11-12 ENCOUNTER — Other Ambulatory Visit

## 2024-11-15 ENCOUNTER — Ambulatory Visit: Admitting: Family Medicine

## 2024-11-15 ENCOUNTER — Encounter: Payer: Self-pay | Admitting: Family Medicine

## 2024-11-15 ENCOUNTER — Telehealth: Payer: Self-pay

## 2024-11-15 ENCOUNTER — Other Ambulatory Visit (HOSPITAL_COMMUNITY): Payer: Self-pay

## 2024-11-15 VITALS — BP 112/68 | HR 73 | Temp 98.1°F | Ht 66.25 in | Wt 203.2 lb

## 2024-11-15 DIAGNOSIS — Z0000 Encounter for general adult medical examination without abnormal findings: Secondary | ICD-10-CM | POA: Diagnosis not present

## 2024-11-15 DIAGNOSIS — L304 Erythema intertrigo: Secondary | ICD-10-CM

## 2024-11-15 DIAGNOSIS — F418 Other specified anxiety disorders: Secondary | ICD-10-CM

## 2024-11-15 DIAGNOSIS — E2839 Other primary ovarian failure: Secondary | ICD-10-CM | POA: Diagnosis not present

## 2024-11-15 DIAGNOSIS — E7800 Pure hypercholesterolemia, unspecified: Secondary | ICD-10-CM | POA: Diagnosis not present

## 2024-11-15 DIAGNOSIS — M8580 Other specified disorders of bone density and structure, unspecified site: Secondary | ICD-10-CM | POA: Diagnosis not present

## 2024-11-15 DIAGNOSIS — H5340 Unspecified visual field defects: Secondary | ICD-10-CM

## 2024-11-15 DIAGNOSIS — R5382 Chronic fatigue, unspecified: Secondary | ICD-10-CM | POA: Diagnosis not present

## 2024-11-15 DIAGNOSIS — E538 Deficiency of other specified B group vitamins: Secondary | ICD-10-CM

## 2024-11-15 DIAGNOSIS — E559 Vitamin D deficiency, unspecified: Secondary | ICD-10-CM | POA: Diagnosis not present

## 2024-11-15 DIAGNOSIS — R7303 Prediabetes: Secondary | ICD-10-CM

## 2024-11-15 MED ORDER — ROSUVASTATIN CALCIUM 10 MG PO TABS: 10.0000 mg | ORAL_TABLET | Freq: Every day | ORAL | 3 refills | Status: AC

## 2024-11-15 NOTE — Patient Instructions (Addendum)
 Keep walking  Add some strength training to your routine, this is important for bone and brain health and can reduce your risk of falls and help your body use insulin  properly and regulate weight  Light weights, exercise bands , and internet videos are a good way to start  Yoga (chair or regular), machines , floor exercises or a gym with machines are also good options   Find some indoor and outdoor options   Continue eating healthy and take care of yourself   You have an order for:  []   3D Mammogram  [x]   Bone Density     Please call for appointment:   [x]   [facility]  [address] [place] [address]  [phone]  []   [facility])   [address]  [place], [place] [address]  [phone]  []   [facility]      [address] [place], [place]        [phone]         []   [facility]  [address] [place], [place]  [phone]  []  [facility]   [address]   [place], [place] [address]  [phone]  []  [facility]  [address] [place], [place] [address] [phone]    Make sure to wear two piece clothing  No lotions powders or deodorants the day of the appointment Make sure to bring picture ID and insurance card.  Bring list of medications you are currently taking including any supplements.   Schedule your screening mammogram through MyChart!   Select [place] imaging sites can now be scheduled through MyChart.  Log into your MyChart account.  Go to 'Visit' (or 'Appointments' if  on mobile App) --> Schedule an  Appointment  Under 'Select a Reason for Visit' choose the Mammogram  Screening option.  Complete the pre-visit questions  and select the time and place that  best fits your  schedule

## 2024-11-15 NOTE — Assessment & Plan Note (Signed)
 Continues fluoxetine  40 mg daily   Doing well overall now that she is retired  PHQ 0

## 2024-11-15 NOTE — Telephone Encounter (Signed)
 Pharmacy Patient Advocate Encounter   Received notification from Onbase that prior authorization for Zepbound  2.5 is required/requested.   Insurance verification completed.   The patient is insured through [employer].   Per chart note 02/27/24 patient is no longer taking this medication. Please advise

## 2024-11-15 NOTE — Assessment & Plan Note (Signed)
 Improved now retired and more time for sefl care

## 2024-11-15 NOTE — Assessment & Plan Note (Signed)
 Reviewed health habits including diet and exercise and skin cancer prevention Reviewed appropriate screening tests for age  Also reviewed health mt list, fam hx and immunization status , as well as social and family history   See HPI Labs reviewed and ordered Health Maintenance  Topic Date Due   COVID-19 Vaccine (6 - 2025-26 season) 08/09/2024   Osteoporosis screening with Bone Density Scan  11/07/2024   Medicare Annual Wellness Visit  11/02/2025   Breast Cancer Screening  11/08/2025   Colon Cancer Screening  04/20/2028   DTaP/Tdap/Td vaccine (4 - Td or Tdap) 06/02/2034   Pneumococcal Vaccine for age over 29  Completed   Flu Shot  Completed   Hepatitis C Screening  Completed   Zoster (Shingles) Vaccine  Completed   Meningitis B Vaccine  Aged Out   Dexa ordered  Discussed fall prevention, supplements and exercise for bone density  Continues program with [facility] PHQ 0 with current medicines

## 2024-11-15 NOTE — Assessment & Plan Note (Signed)
 Dexa ordered

## 2024-11-15 NOTE — Assessment & Plan Note (Signed)
 Due for dexa/ordered No falls or fractures Discussed fall prevention, supplements and exercise for bone density  D level is in normal range Encouraged to add more muscle building exercise

## 2024-11-15 NOTE — Assessment & Plan Note (Signed)
 Under breasts/pannus   Using ketoconazole  cream  Encouraged to keep area dry as much as possible Can try over the counter antifungal powder also   Working on weight loss

## 2024-11-15 NOTE — Progress Notes (Signed)
 Subjective:    Patient ID: Pamela Anthony, female    DOB: 12-Nov-1956, 68 y.o.   MRN: [id number]  HPI  Here for health maintenance exam and to review chronic medical problems   Wt Readings from Last 3 Encounters:  11/15/24 203 lb 4 oz (92.2 kg)  11/02/24 198 lb (89.8 kg)  10/19/24 197 lb (89.4 kg)   32.56 kg/m  Vitals:   11/15/24 0943  BP: 112/68  Pulse: 73  Temp: 98.1 F (36.7 C)  SpO2: 98%    Immunization History  Administered Date(s) Administered   Fluad Quad(high Dose 65+) 08/31/2020, 09/05/2022   Influenza Split 09/05/2011   Influenza Whole 12/10/2003, 08/28/2010   Influenza, Seasonal, Injecte, Preservative Fre 09/10/2023, 08/31/2024   Influenza,inj,Quad PF,6+ Mos 09/01/2013, 08/31/2014, 09/14/2015, 09/10/2016, 09/09/2017, 09/01/2018, 08/20/2019, 09/07/2021   PFIZER Comirnaty(Gray Top)Covid-19 Tri-Sucrose Vaccine 10/09/2020   PFIZER(Purple Top)SARS-COV-2 Vaccination 02/26/2020, 03/22/2020, 10/09/2020   PNEUMOCOCCAL CONJUGATE-20 11/04/2022   Pfizer Covid-19 Vaccine Bivalent Booster 40yrs & up 11/05/2021   Pneumococcal Polysaccharide-23 09/29/2009, 08/31/2014, 10/27/2019   Td 07/16/2004, 06/02/2024   Tdap 08/31/2014   Zoster Recombinant(Shingrix) 07/17/2018, 09/12/2018   Zoster, Live 10/05/2014    Health Maintenance Due  Topic Date Due   COVID-19 Vaccine (6 - 2025-26 season) 08/09/2024   Bone Density Scan  11/07/2024   Retired now ! Enjoying it so far  A little traveling  Working on getting into routine   Not eating right with traveling Making a plan for lifestyle habits  January- will make plan   Mammogram 11/2024 Self breast exam- no lumps   Gyn health No problems   Colon cancer screening  Colonoscopy 04/2018 with 10 y recall   Bone health  Dexa 10/2022  osteopenia  Falls-none Fractures-none  Supplements  Last vitamin D  Lab Results  Component Value Date   VD25OH 58.3 10/19/2024  High dose weekly prescription   Exercise  Walking   Considering pilates   Does get rash under belly Ketoconazole  2% cream-helps    Mood    11/15/2024    9:57 AM 11/02/2024    9:50 AM 03/03/2024   10:34 AM 11/10/2023   10:00 AM 11/04/2023   10:05 AM  Depression screen PHQ 2/9  Decreased Interest 0 0 0 0 0  Down, Depressed, Hopeless 0 0 0 0 0  PHQ - 2 Score 0 0 0 0 0  Altered sleeping 0 0 0 0   Tired, decreased energy 0 0 0    Change in appetite 0 0 0 1   Feeling bad or failure about yourself  0 0 0 0   Trouble concentrating 0 0 0 0   Moving slowly or fidgety/restless 0 0 0 0   Suicidal thoughts 0 0 0 0   PHQ-9 Score 0 0 0  1    Difficult doing work/chores Not difficult at all Not difficult at all Not difficult at all Not difficult at all Not difficult at all     Data saved with a previous flowsheet row definition   History of depression and anxiety  Fluoxetine  40 mg daily  Happy to be retired   Prediabetes Lab Results  Component Value Date   HGBA1C 5.2 10/19/2024   HGBA1C 5.4 03/23/2024   HGBA1C 5.1 10/28/2023  Takes metformin  500 mg bid from healthy [facility] clinic  Also topamax  (for migraine also)    Hyperlipidemia Lab Results  Component Value Date   CHOL 132 10/19/2024   CHOL 152 03/23/2024   CHOL 138 10/28/2023  Lab Results  Component Value Date   HDL 48 10/19/2024   HDL 47 03/23/2024   HDL 43.00 10/28/2023   Lab Results  Component Value Date   LDLCALC 68 10/19/2024   LDLCALC 81 03/23/2024   LDLCALC 73 10/28/2023   Lab Results  Component Value Date   TRIG 82 10/19/2024   TRIG 136 03/23/2024   TRIG 106.0 10/28/2023   Lab Results  Component Value Date   CHOLHDL 3.2 03/23/2024   CHOLHDL 3 10/28/2023   CHOLHDL 3 10/28/2022   Lab Results  Component Value Date   LDLDIRECT 182.2 08/25/2013   LDLDIRECT 183.6 08/07/2012   LDLDIRECT 170.8 04/23/2011   Crestor  10 mg daily Eating better    Lab Results  Component Value Date   TSH 1.90 06/02/2024   Lab Results  Component Value Date   WBC  4.6 03/23/2024   HGB 13.2 03/23/2024   HCT 40.2 03/23/2024   MCV 91 03/23/2024   PLT 213 03/23/2024   Lab Results  Component Value Date   NA 144 10/19/2024   K 4.2 10/19/2024   CO2 22 10/19/2024   GLUCOSE 85 10/19/2024   BUN 20 10/19/2024   CREATININE 0.67 10/19/2024   CALCIUM  8.8 10/19/2024   GFR 93.36 06/02/2024   EGFR 96 10/19/2024   GFRNONAA 93.04 02/28/2010   Lab Results  Component Value Date   ALT 12 10/19/2024   AST 18 10/19/2024   ALKPHOS 65 10/19/2024   BILITOT 0.5 10/19/2024     Lab Results  Component Value Date   VITAMINB12 638 03/23/2024     Patient Active Problem List   Diagnosis Date Noted   Osteopenia 11/15/2024   BMI 31.0-31.9,adult 09/13/2024   Pedal edema 06/02/2024   Elevated brain natriuretic peptide (BNP) level 06/02/2024   Contusion of left shoulder 12/26/2023   Pain of left hip joint 12/12/2023   Trochanteric bursitis of left hip 12/12/2023   Disordered eating 10/28/2023   Class 1 obesity with serious comorbidity and body mass index (BMI) of 33.0 to 33.9 in adult 10/28/2023   Encounter for orthopedic follow-up care 08/15/2023   Rupture of distal biceps tendon 07/10/2023   Head pain 07/08/2023   Arthralgia of left elbow 04/17/2023   Lesion of bone of elbow 04/17/2023   Estrogen deficiency 11/04/2022   Paresthesia of left leg 05/14/2022   Arthralgia of left knee 06/13/2021   Arthritis of left knee 06/13/2021   Chondromalacia of left patella 06/13/2021   ANA positive 10/17/2020   Elevated antinuclear antibody (ANA) level 09/20/2020   Intertrigo 04/28/2020   Dermatochalasis of eyelid 09/09/2019   Ptosis, both eyelids 09/09/2019   Visual field defect 09/09/2019   Deviated septum 05/11/2018   Nasal obstruction 05/11/2018   Nasal valve collapse 05/11/2018   Colon cancer screening 03/18/2018   Fatigue 03/18/2018   Prediabetes 03/16/2015   Stress reaction 01/16/2015   Encounter for routine gynecological examination 09/01/2013    Depression with anxiety 01/18/2013   Post-menopausal 08/14/2012   Other screening mammogram 04/29/2011   Routine general medical examination at a health care facility 04/22/2011   Joint pain 02/28/2010   Myofascial pain syndrome 02/28/2010   Vitamin D  deficiency 03/08/2009   IRRITABLE BOWEL SYNDROME 06/27/2008   B12 deficiency 02/23/2008   POLYP, COLON 03/06/2007   Hyperlipidemia 03/06/2007   Allergic rhinitis 03/06/2007   GERD 03/06/2007   Diaphragmatic hernia 03/06/2007   OSA (obstructive sleep apnea) 03/06/2007   URINARY INCONTINENCE 03/06/2007   Migraine 03/06/2007   Urinary  incontinence 03/06/2007   Past Medical History:  Diagnosis Date   Allergic rhinitis    Allergy    Anxiety    Arthritis    B12 deficiency    Bilateral swelling of feet    legs   Constipation    Degenerative disc disease    back, knee   Family history of adverse reaction to anesthesia    Fibromyalgia    Fungal infection 2005   lamasil    GERD (gastroesophageal reflux disease)    EGD 10/2004   Heart burn    Hemorrhoids, internal    colonoscopy 02/2008   Hyperlipemia    Iron deficiency anemia    Low back pain    Migraine headache    improved with medications   Obesity    Orthodontics    [facility]   Sleep apnea    no cpap   Swallowing difficulty    Urinary incontinence    Vaso-vagal reaction    occured after eye drops for cataract surgery, but pt reports this has happened before during medical procedures   Vitamin D  deficiency    mild   Past Surgical History:  Procedure Laterality Date   CATARACT EXTRACTION W/PHACO Left 03/25/2022   Procedure: CATARACT EXTRACTION PHACO AND INTRAOCULAR LENS PLACEMENT (IOC) LEFT;  Surgeon: Mittie Gaskin, MD;  Location: [facility];  Service: Ophthalmology;  Laterality: Left;  8.88 1:13.0   CATARACT EXTRACTION W/PHACO Right 04/17/2022   Procedure: CATARACT EXTRACTION PHACO AND INTRAOCULAR LENS PLACEMENT (IOC) RIGHT 7.22 01:22.1;  Surgeon:  Mittie Gaskin, MD;  Location: [facility];  Service: Ophthalmology;  Laterality: Right;  sleep apnea   CESAREAN SECTION  1985   COLONOSCOPY     DILATION AND CURETTAGE OF UTERUS  2005   HAND SURGERY  2000   nodule removed   KNEE SURGERY  1970's   right   NASAL SINUS SURGERY  2000's   right maxillary   TONSILLECTOMY AND ADENOIDECTOMY     TUBAL LIGATION  May 1985   Social History   Tobacco Use   Smoking status: Never   Smokeless tobacco: Never  Vaping Use   Vaping status: Never Used  Substance Use Topics   Alcohol use: Not Currently    Comment: occasional   Drug use: No   Family History  Problem Relation Age of Onset   Stroke Mother    Hyperlipidemia Mother    Hypertension Mother    Cancer Mother    COPD Mother    Hearing loss Mother    Hyperlipidemia Father    Hypertension Father    Coronary artery disease Father        in his 34's   Arthritis Father    Stroke Father    Heart disease Father        pacemaker   Prostate cancer Father    Hearing loss Father    Breast cancer Maternal Grandmother    Cancer Maternal Grandmother    Breast cancer Cousin        mat cousin   Colon cancer Other    Colon polyps Neg Hx    Allergies  Allergen Reactions   Atorvastatin     REACTION: myalgias   Fluogen [Influenza Virus Vaccine]     High dose  Felt sick/achy    Haemophilus B Polysaccharide Vaccine     High dose  Felt sick/achy   Current Outpatient Medications on File Prior to Visit  Medication Sig Dispense Refill   Ascorbic Acid (VITAMIN  C) 1000 MG tablet Take 1,000 mg by mouth daily.     cetirizine (ZYRTEC) 10 MG tablet Take 10 mg by mouth at bedtime.     Cholecalciferol (VITAMIN D -3 PO) Take 1 tablet by mouth daily.     Cyanocobalamin  (B-12 PO) Take by mouth daily.     Flaxseed, Linseed, (FLAXSEED OIL) 1000 MG CAPS Take 1,000 mg by mouth daily at 12 noon.     FLUoxetine  (PROZAC ) 20 MG capsule Take 2 capsules (40 mg total) by mouth daily. 180 capsule 3    fluticasone  (FLONASE ) 50 MCG/ACT nasal spray Place 2 sprays into both nostrils daily. 48 g 3   GLUCOMANNAN PO Take 600 mg by mouth 3 (three) times daily.     ketoconazole  (NIZORAL ) 2 % cream Apply topically daily. To affected area 15 g 0   levocetirizine (XYZAL) 5 MG tablet Take 5 mg by mouth at bedtime.     metFORMIN  (GLUCOPHAGE ) 500 MG tablet Take 1 tablet (500 mg total) by mouth 2 (two) times daily with a meal. 180 tablet 0   mometasone (ELOCON) 0.1 % cream   0   mometasone (ELOCON) 0.1 % ointment Apply topically daily.     Multiple Vitamins-Minerals (ANTIOXIDANT PO) Take by mouth daily at 12 noon. Dosage unknow     omeprazole  (PRILOSEC) 20 MG capsule Take 1 capsule (20 mg total) by mouth daily. 90 capsule 1   oxybutynin  (DITROPAN -XL) 10 MG 24 hr tablet Take 1 tablet (10 mg total) by mouth at bedtime. 90 tablet 3   SUMAtriptan  (IMITREX ) 100 MG tablet Take 1 tablet (100 mg total) by mouth once as needed for up to 1 dose for migraine. May repeat in 2 hours if headache persists or recurs. 10 tablet 5   topiramate  (TOPAMAX ) 50 MG tablet Take 1 tablet (50 mg total) by mouth at bedtime. 90 tablet 3   Vitamin D , Ergocalciferol , (DRISDOL ) 1.25 MG (50000 UNIT) CAPS capsule Take 1 capsule (50,000 Units total) by mouth every 7 (seven) days. 12 capsule 0   No current facility-administered medications on file prior to visit.    Review of Systems  Constitutional:  Negative for activity change, appetite change, fatigue, fever and unexpected weight change.  HENT:  Negative for congestion, ear pain, rhinorrhea, sinus pressure and sore throat.   Eyes:  Negative for pain, redness and visual disturbance.  Respiratory:  Negative for cough, shortness of breath and wheezing.   Cardiovascular:  Negative for chest pain and palpitations.  Gastrointestinal:  Negative for abdominal pain, blood in stool, constipation and diarrhea.  Endocrine: Negative for polydipsia and polyuria.  Genitourinary:  Negative for  dysuria, frequency and urgency.  Musculoskeletal:  Positive for arthralgias. Negative for back pain and myalgias.  Skin:  Negative for pallor and rash.  Allergic/Immunologic: Negative for environmental allergies.  Neurological:  Negative for dizziness, syncope and headaches.  Hematological:  Negative for adenopathy. Does not bruise/bleed easily.  Psychiatric/Behavioral:  Negative for decreased concentration and dysphoric mood. The patient is not nervous/anxious.        Objective:   Physical Exam Constitutional:      General: She is not in acute distress.    Appearance: Normal appearance. She is well-developed. She is obese. She is not ill-appearing or diaphoretic.  HENT:     Head: Normocephalic and atraumatic.     Right Ear: Tympanic membrane, ear canal and external ear normal.     Left Ear: Tympanic membrane, ear canal and external ear normal.  Nose: Nose normal. No congestion.     Mouth/Throat:     Mouth: Mucous membranes are moist.     Pharynx: Oropharynx is clear. No posterior oropharyngeal erythema.  Eyes:     General: No scleral icterus.    Extraocular Movements: Extraocular movements intact.     Conjunctiva/sclera: Conjunctivae normal.     Pupils: Pupils are equal, round, and reactive to light.  Neck:     Thyroid : No thyromegaly.     Vascular: No carotid bruit or JVD.  Cardiovascular:     Rate and Rhythm: Normal rate and regular rhythm.     Pulses: Normal pulses.     Heart sounds: Normal heart sounds.     No gallop.  Pulmonary:     Effort: Pulmonary effort is normal. No respiratory distress.     Breath sounds: Normal breath sounds. No wheezing.     Comments: Good air exch Chest:     Chest wall: No tenderness.  Abdominal:     General: Bowel sounds are normal. There is no distension or abdominal bruit.     Palpations: Abdomen is soft. There is no mass.     Tenderness: There is no abdominal tenderness.     Hernia: No hernia is present.  Genitourinary:     Comments: Breast exam: No mass, nodules, thickening, tenderness, bulging, retraction, inflamation, nipple discharge or skin changes noted.  No axillary or clavicular LA.     Musculoskeletal:        General: No tenderness. Normal range of motion.     Cervical back: Normal range of motion and neck supple. No rigidity. No muscular tenderness.     Right lower leg: No edema.     Left lower leg: No edema.     Comments: No kyphosis   Lymphadenopathy:     Cervical: No cervical adenopathy.  Skin:    General: Skin is warm and dry.     Coloration: Skin is not pale.     Findings: No erythema or rash.     Comments: Solar lentigines diffusely  Rash under breasts/pannus is controlled   Neurological:     Mental Status: She is alert. Mental status is at baseline.     Cranial Nerves: No cranial nerve deficit.     Motor: No abnormal muscle tone.     Coordination: Coordination normal.     Gait: Gait normal.     Deep Tendon Reflexes: Reflexes are normal and symmetric. Reflexes normal.  Psychiatric:        Mood and Affect: Mood normal.        Cognition and Memory: Cognition and memory normal.           Assessment & Plan:   Problem List Items Addressed This Visit       Musculoskeletal and Integument   Osteopenia   Due for dexa/ordered No falls or fractures Discussed fall prevention, supplements and exercise for bone density  D level is in normal range Encouraged to add more muscle building exercise       Intertrigo   Under breasts/pannus   Using ketoconazole  cream  Encouraged to keep area dry as much as possible Can try over the counter antifungal powder also   Working on weight loss         Other   Vitamin D  deficiency   Last vitamin D  Lab Results  Component Value Date   VD25OH 58.3 10/19/2024   Vitamin D  level is therapeutic with current supplementation (high dose weekly)  Disc importance of this to bone and overall health       Visual field defect   Continues  regular ophy care      Routine general medical examination at a health care facility - Primary   Reviewed health habits including diet and exercise and skin cancer prevention Reviewed appropriate screening tests for age  Also reviewed health mt list, fam hx and immunization status , as well as social and family history   See HPI Labs reviewed and ordered Health Maintenance  Topic Date Due   COVID-19 Vaccine (6 - 2025-26 season) 08/09/2024   Osteoporosis screening with Bone Density Scan  11/07/2024   Medicare Annual Wellness Visit  11/02/2025   Breast Cancer Screening  11/08/2025   Colon Cancer Screening  04/20/2028   DTaP/Tdap/Td vaccine (4 - Td or Tdap) 06/02/2034   Pneumococcal Vaccine for age over 29  Completed   Flu Shot  Completed   Hepatitis C Screening  Completed   Zoster (Shingles) Vaccine  Completed   Meningitis B Vaccine  Aged Out   Dexa ordered  Discussed fall prevention, supplements and exercise for bone density  Continues program with healthy weight clinic PHQ 0 with current medicines        Prediabetes   Lab Results  Component Value Date   HGBA1C 5.2 10/19/2024   HGBA1C 5.4 03/23/2024   HGBA1C 5.1 10/28/2023   Improved  disc imp of low glycemic diet and wt loss to prevent DM2       Hyperlipidemia   Disc goals for lipids and reasons to control them Rev last labs with pt Rev low sat fat diet in detail  LDL down to 68 with crestor  10 mg and better diet       Relevant Medications   rosuvastatin  (CRESTOR ) 10 MG tablet   Fatigue   Improved now retired and more time for sefl care       Estrogen deficiency   Dexa ordered       Relevant Orders   DG Bone Density   Depression with anxiety   Continues fluoxetine  40 mg daily   Doing well overall now that she is retired  PHQ 0       B12 deficiency   Lab Results  Component Value Date   VITAMINB12 638 03/23/2024   Continues oral supplementation

## 2024-11-15 NOTE — Assessment & Plan Note (Signed)
 Lab Results  Component Value Date   VITAMINB12 638 03/23/2024   Continues oral supplementation

## 2024-11-15 NOTE — Telephone Encounter (Signed)
 Pt isn't taking med

## 2024-11-15 NOTE — Assessment & Plan Note (Signed)
 Continues regular ophy care

## 2024-11-15 NOTE — Assessment & Plan Note (Signed)
 Disc goals for lipids and reasons to control them Rev last labs with pt Rev low sat fat diet in detail  LDL down to 68 with crestor  10 mg and better diet

## 2024-11-15 NOTE — Assessment & Plan Note (Signed)
 Lab Results  Component Value Date   HGBA1C 5.2 10/19/2024   HGBA1C 5.4 03/23/2024   HGBA1C 5.1 10/28/2023   Improved  disc imp of low glycemic diet and wt loss to prevent DM2

## 2024-11-15 NOTE — Assessment & Plan Note (Signed)
 Last vitamin D  Lab Results  Component Value Date   VD25OH 58.3 10/19/2024   Vitamin D  level is therapeutic with current supplementation (high dose weekly)  Disc importance of this to bone and overall health

## 2024-11-16 DIAGNOSIS — H401121 Primary open-angle glaucoma, left eye, mild stage: Secondary | ICD-10-CM | POA: Diagnosis not present

## 2024-11-16 DIAGNOSIS — Z961 Presence of intraocular lens: Secondary | ICD-10-CM | POA: Diagnosis not present

## 2024-11-16 DIAGNOSIS — H40001 Preglaucoma, unspecified, right eye: Secondary | ICD-10-CM | POA: Diagnosis not present

## 2024-11-16 DIAGNOSIS — H26492 Other secondary cataract, left eye: Secondary | ICD-10-CM | POA: Diagnosis not present

## 2024-11-22 ENCOUNTER — Other Ambulatory Visit (HOSPITAL_COMMUNITY): Payer: Self-pay

## 2024-12-03 ENCOUNTER — Other Ambulatory Visit

## 2024-12-04 ENCOUNTER — Other Ambulatory Visit: Payer: Self-pay | Admitting: Family Medicine

## 2024-12-06 NOTE — Telephone Encounter (Signed)
 Last filled on 11/10/23 #90 tab/ 3 refills   CPE was on 11/15/24

## 2024-12-16 ENCOUNTER — Other Ambulatory Visit: Payer: Self-pay | Admitting: Family Medicine

## 2024-12-16 NOTE — Telephone Encounter (Signed)
 Last filled on 11/02/24 #15 g/ 0 refills   CPE 11/15/24

## 2024-12-17 ENCOUNTER — Other Ambulatory Visit: Payer: Self-pay | Admitting: Family Medicine

## 2024-12-20 ENCOUNTER — Ambulatory Visit (INDEPENDENT_AMBULATORY_CARE_PROVIDER_SITE_OTHER): Admitting: Family Medicine

## 2024-12-21 ENCOUNTER — Encounter: Payer: Self-pay | Admitting: Family Medicine

## 2024-12-21 DIAGNOSIS — E88819 Insulin resistance, unspecified: Secondary | ICD-10-CM

## 2024-12-21 MED ORDER — METFORMIN HCL 500 MG PO TABS: 500.0000 mg | ORAL_TABLET | Freq: Two times a day (BID) | ORAL | 1 refills | Status: AC

## 2024-12-28 ENCOUNTER — Ambulatory Visit: Payer: Self-pay | Admitting: Family Medicine

## 2024-12-28 ENCOUNTER — Ambulatory Visit
Admission: RE | Admit: 2024-12-28 | Discharge: 2024-12-28 | Disposition: A | Discharge status: Home / Self Care | Source: Ambulatory Visit | Attending: Family Medicine | Admitting: Family Medicine

## 2024-12-28 DIAGNOSIS — E2839 Other primary ovarian failure: Secondary | ICD-10-CM | POA: Diagnosis present
